# Patient Record
Sex: Male | Born: 1950 | Race: White | Hispanic: No | Marital: Married
Health system: Midwestern US, Academic
[De-identification: ages and names within clinical notes are randomized; demographics above are authoritative.]

---

## 2017-10-14 ENCOUNTER — Ambulatory Visit: Admit: 2017-10-14 | Discharge: 2017-10-14 | Payer: BC Managed Care – PPO

## 2017-10-14 ENCOUNTER — Encounter: Admit: 2017-10-14 | Discharge: 2017-10-14 | Payer: BC Managed Care – PPO

## 2017-10-14 DIAGNOSIS — H04123 Dry eye syndrome of bilateral lacrimal glands: ICD-10-CM

## 2017-10-14 DIAGNOSIS — Z961 Presence of intraocular lens: ICD-10-CM

## 2017-10-14 DIAGNOSIS — H43813 Vitreous degeneration, bilateral: Principal | ICD-10-CM

## 2017-10-14 NOTE — Assessment & Plan Note
PT does not appear overly dry, but has ongoing change in refraction ever since cataract surgery OS    Pt said he had to return 4 times before CEIOL to get measurements of his eyes that agreed OS    Use ATs QID and see if topo and refraction stabilizes.

## 2017-10-14 NOTE — Progress Notes
Assessment and Plan:    Problem   Posterior Vitreous Detachment of Both Eyes   Dry Eye Syndrome of Both Eyes   Pseudophakia, Both Eyes    Dr. Caron Presume:  OS 03/10/17 - Toric for distance - had to get measurements x4 pre-op - has changed glasses RX 2x since surgery - was told lens was migrating and he needed LRIs  OD: 03/2017 - monofocal lens with LRI for distance         Posterior vitreous detachment of both eyes  Reassured pt. Long-standing. OCT mac WNL OU    Dry eye syndrome of both eyes  PT does not appear overly dry, but has ongoing change in refraction ever since cataract surgery OS    Pt said he had to return 4 times before CEIOL to get measurements of his eyes that agreed OS    Use ATs QID and see if topo and refraction stabilizes.        Pseudophakia, both eyes  Pt unhappy with vision after CEIOL. Make sure RX is stabilized before giving new rx.    Also try treating DES to see if this helps     Also discussed possibility of refractive surgery    Get old records to determine where IOL was placed originally    Next Visit: 6-8 weeks        MRx x   ECC    Gallelei x   Atlas x   OCT (M)(N)    Orbscan    LG x   TF BUT    Schirmer    IOP x   Pach    Dilate    BAM    B-Scan    HVF    TOSM x   Gonio      Madelaine Etienne, MD  [facility]             HPI:  Patient presents with:  Eye Problem: Pt here for blurry vision in both eyes near and far. He had cataract surgery earlier this year by Dr. Caron Presume. 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 He has floaters since the cataract surgery that don't go away.   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     OS (toric lens) was 03/10/17 and OD (with LRIs) was two weeks after that.     Both eyes were supposed to be corrected for distance per pt.  Vision was great first 3 weeks OS, but then it changed and vision became much more distorted.  Pt had toric lens OS.  Pt was told his IOL had migrated.  He was given new glasses. It changed again another month after that.  OD also was clear initially, and then an oil film developed.    Pt was offered LRI OS as well.      Pt notices floaters OU    Exam:  Base Eye Exam     Visual Acuity (Snellen - Linear)       Right Left    Dist cc 20/30 -1 20/40 -1    Dist ph cc  20/20 -2    Correction:  Glasses          Tonometry (Tonopen, 10:32 AM)       Right Left    Pressure 13 11  Pupils       Dark Shape React APD    Right 5 Round Brisk None    Left 5 Round Brisk None          Visual Fields (Counting fingers)       Left Right     Full Full          Extraocular Movement       Right Left     Full, Ortho Full, Ortho          Neuro/Psych     Oriented x3:  Yes    Mood/Affect:  Normal          Dilation     Both eyes:  1.0% Tropicamide, 2.5% Phenylephrine @ 10:32 AM            Slit Lamp and Fundus Exam     Slit Lamp Exam       Right Left    Lids/Lashes Normal Normal    Conjunctiva/Sclera White and quiet White and quiet    Cornea Clear Clear    Anterior Chamber Deep and quiet Deep and quiet    Iris Flat Flat    Lens Posterior chamber intraocular lens, tr pco nasally Toric IOL at about 150; clear capsule    Vitreous Posterior vitreous detachment Posterior vitreous detachment          Fundus Exam       Right Left    Disc Sharp, healthy rim Sharp, healthy rim    C/D Ratio 0.2 0.2    Macula Flat Flat    Vessels Normal caliber and number Normal caliber and number    Periphery Attached, no breaks or tears Attached, no breaks or tears            Refraction     Wearing Rx       Sphere Add    Right [place] +2.75    Left -1.25 +2.75          Manifest Refraction       Sphere Cylinder Axis Dist VA Add [place]    Right -0.25 +0.50 059 20/20 +2.50 20/20    Left -1.75 +1.00 041 20/25 +2.50 20/20

## 2017-10-14 NOTE — Assessment & Plan Note
Pt unhappy with vision after CEIOL. Make sure RX is stabilized before giving new rx.    Also try treating DES to see if this helps     Also discussed possibility of refractive surgery    Get old records to determine where IOL was placed originally

## 2017-10-14 NOTE — Assessment & Plan Note
Reassured pt. Long-standing. OCT mac WNL OU

## 2017-10-23 ENCOUNTER — Encounter: Admit: 2017-10-23 | Discharge: 2017-10-23 | Payer: BC Managed Care – PPO

## 2017-11-26 ENCOUNTER — Ambulatory Visit: Admit: 2017-11-26 | Discharge: 2017-11-27 | Payer: BC Managed Care – PPO

## 2017-11-26 ENCOUNTER — Encounter: Admit: 2017-11-26 | Discharge: 2017-11-26 | Payer: BC Managed Care – PPO

## 2017-11-26 DIAGNOSIS — H04123 Dry eye syndrome of bilateral lacrimal glands: Principal | ICD-10-CM

## 2017-11-26 NOTE — Progress Notes
Assessment and Plan:    Problem   Dry Eye Syndrome of Both Eyes    0.7 plugs OU 11/26/17         Dry eye syndrome of both eyes  Plugs placed OU today. Mild LG stain and normal Tosm, but irregular astigmatism could be consistent with dry eye    Need to keep repeating refraction and topo until stable - once stable consider glasses RX vs. PRK with Dr. Darcus Austin    Pt had SA6AT3 toric lens placed at 140 - does not appear to have rotated - astigmatism appears over-corrected at this time, but lens choice was appropriate given measurements used at time of surgery. Pt still has mild edema at incision site - as this resolves, this may account for the overcorrection.  Pt also had to repeat measure 4-6 times pre-op in order to get consistent measurements OS    Next Visit: 4 weeks  MD to see if pt is ortho next visit      MRx x   ECC    Gallelei x   Atlas x   OCT (M)(N)    Orbscan    LG    TF BUT    Schirmer    IOP x   Pach    Dilate    BAM    B-Scan    HVF    TOSM    Gonio      Madelaine Etienne, MD  [facility]             HPI:  Patient presents with:  Eye Problem: 2 mo FUV for PVD OU, DES and Pseudophakia OU w/ possibility of refractive surgery. No va changes. No pain or discomfort. No changes in floaters and no flashes of lights.   Medications Only: AT PRN OU.     Pt is using tears once or twice daily.    Exam:  Base Eye Exam     Visual Acuity (Snellen - Linear)       Right Left    Dist cc 20/40 20/40    Correction:  Glasses          Tonometry (I-care , 2:05 PM)       Right Left    Pressure 10 10          Pupils       Dark React APD    Right 6 + 0    Left 5 + 0          Neuro/Psych     Oriented x3:  Yes    Mood/Affect:  Normal            Slit Lamp and Fundus Exam     Slit Lamp Exam       Right Left    Lids/Lashes Normal Normal    Conjunctiva/Sclera 1+ LG nasal 1+ LG nasal    Cornea Clear Clear    Anterior Chamber Deep and quiet Deep and quiet    Iris Flat Flat Lens Posterior chamber intraocular lens, tr pco nasally Toric IOL at about 150; clear capsule    Vitreous Posterior vitreous detachment Posterior vitreous detachment            Refraction     Wearing Rx       Sphere Add    Right [place] +2.75    Left -1.25 +2.75          Manifest Refraction       Sphere Cylinder Axis Dist  VA    Right -0.25 +0.75 053 20/20    Left -1.50 +1.00 017 20/20                TEAR OSMOLARITY       Interpretation / Result:         Fluctuation in vision  x Redness    Contact Lens discomfort  Burning   x Light Sensitivity x Itching    Watery eyes  Feeling of sand or grit in eye   x Tired eyes             Tear Osmo        OD 287       OS 293              CORNEAL TOPOGRAPHY       Spherical OD; irregular astigmatism OS - still has swelling at site of incision           CLOSURE LACRIMAL PUNC BY PLUG, OU-BOTH EYES       Interpretation / Result:  0.7 placed LL OU without complication

## 2017-11-26 NOTE — Assessment & Plan Note
Plugs placed OU today. Mild LG stain and normal Tosm, but irregular astigmatism could be consistent with dry eye    Need to keep repeating refraction and topo until stable - once stable consider glasses RX vs. PRK with Dr. Darcus AustinGoins    Pt had SA6AT3 toric lens placed at 140 - does not appear to have rotated - astigmatism appears over-corrected at this time, but lens choice was appropriate given measurements used at time of surgery. Pt still has mild edema at incision site - as this resolves, this may account for the overcorrection.  Pt also had to repeat measure 4-6 times pre-op in order to get consistent measurements OS

## 2017-12-24 ENCOUNTER — Ambulatory Visit: Admit: 2017-12-24 | Discharge: 2017-12-25 | Payer: BC Managed Care – PPO

## 2017-12-24 DIAGNOSIS — H04123 Dry eye syndrome of bilateral lacrimal glands: Principal | ICD-10-CM

## 2017-12-24 MED ORDER — CYCLOSPORINE 0.05 % OP DPET
1 [drp] | Freq: Two times a day (BID) | OPHTHALMIC | 3 refills | 90.00000 days | Status: AC
Start: 2017-12-24 — End: 2018-03-25

## 2018-01-06 ENCOUNTER — Encounter: Admit: 2018-01-06 | Discharge: 2018-01-06 | Payer: BC Managed Care – PPO

## 2018-01-12 ENCOUNTER — Encounter: Admit: 2018-01-12 | Discharge: 2018-01-12 | Payer: BC Managed Care – PPO

## 2018-02-08 ENCOUNTER — Encounter: Admit: 2018-02-08 | Discharge: 2018-02-08 | Payer: BC Managed Care – PPO

## 2018-03-25 ENCOUNTER — Encounter: Admit: 2018-03-25 | Discharge: 2018-03-25 | Payer: BC Managed Care – PPO

## 2018-03-25 ENCOUNTER — Ambulatory Visit: Admit: 2018-03-25 | Discharge: 2018-03-25 | Payer: BC Managed Care – PPO

## 2018-03-25 DIAGNOSIS — H04123 Dry eye syndrome of bilateral lacrimal glands: Principal | ICD-10-CM

## 2018-03-25 DIAGNOSIS — H43813 Vitreous degeneration, bilateral: ICD-10-CM

## 2018-07-09 ENCOUNTER — Ambulatory Visit: Admit: 2018-07-09 | Discharge: 2018-07-10 | Payer: BC Managed Care – PPO

## 2018-07-09 DIAGNOSIS — H43813 Vitreous degeneration, bilateral: Principal | ICD-10-CM

## 2018-07-12 ENCOUNTER — Encounter: Admit: 2018-07-12 | Discharge: 2018-07-12 | Payer: BC Managed Care – PPO

## 2018-07-14 ENCOUNTER — Encounter: Admit: 2018-07-14 | Discharge: 2018-07-14 | Payer: BC Managed Care – PPO

## 2018-07-16 ENCOUNTER — Encounter: Admit: 2018-07-16 | Discharge: 2018-07-16 | Payer: BC Managed Care – PPO

## 2018-08-10 ENCOUNTER — Encounter: Admit: 2018-08-10 | Discharge: 2018-08-10 | Payer: BC Managed Care – PPO

## 2018-08-10 DIAGNOSIS — H43399 Other vitreous opacities, unspecified eye: Principal | ICD-10-CM

## 2018-08-26 ENCOUNTER — Encounter: Admit: 2018-08-26 | Discharge: 2018-08-26 | Payer: BC Managed Care – PPO

## 2018-08-26 DIAGNOSIS — M2560 Stiffness of unspecified joint, not elsewhere classified: ICD-10-CM

## 2018-08-26 DIAGNOSIS — E785 Hyperlipidemia, unspecified: Principal | ICD-10-CM

## 2018-09-01 ENCOUNTER — Encounter: Admit: 2018-09-01 | Discharge: 2018-09-01 | Payer: BC Managed Care – PPO

## 2018-09-02 ENCOUNTER — Encounter: Admit: 2018-09-02 | Discharge: 2018-09-02 | Payer: BC Managed Care – PPO

## 2018-09-02 ENCOUNTER — Ambulatory Visit: Admit: 2018-09-02 | Discharge: 2018-09-02 | Payer: BC Managed Care – PPO

## 2018-09-02 DIAGNOSIS — H43391 Other vitreous opacities, right eye: Principal | ICD-10-CM

## 2018-09-02 DIAGNOSIS — H43813 Vitreous degeneration, bilateral: ICD-10-CM

## 2018-09-02 DIAGNOSIS — Z87891 Personal history of nicotine dependence: ICD-10-CM

## 2018-09-02 DIAGNOSIS — E785 Hyperlipidemia, unspecified: ICD-10-CM

## 2018-09-02 DIAGNOSIS — M2560 Stiffness of unspecified joint, not elsewhere classified: ICD-10-CM

## 2018-09-02 MED ORDER — TRIAMCINOLONE ACETONIDE 40 MG/ML IJ SUSP
0 refills | Status: DC
Start: 2018-09-02 — End: 2018-09-02
  Administered 2018-09-02: 18:00:00 40 mg via INTRAMUSCULAR

## 2018-09-02 MED ORDER — DIFLUPREDNATE 0.05 % OP DROP
1 [drp] | Freq: Four times a day (QID) | OPHTHALMIC | 1 refills | 19.00000 days | Status: AC
Start: 2018-09-02 — End: 2018-10-11

## 2018-09-02 MED ORDER — EPINEPHRINE 0.3 MG IN ISOTONIC OPHTH IRR(BSS+)
0 refills | Status: DC
Start: 2018-09-02 — End: 2018-09-02
  Administered 2018-09-02 (×2): 500 mL via INTRAOCULAR

## 2018-09-02 MED ORDER — PROPOFOL INJ 10 MG/ML IV VIAL
0 refills | Status: DC
Start: 2018-09-02 — End: 2018-09-02
  Administered 2018-09-02: 18:00:00 50 mg via INTRAVENOUS

## 2018-09-02 MED ORDER — DEXAMETHASONE SODIUM PHOSPHATE 4 MG/ML IJ SOLN
0 refills | Status: DC
Start: 2018-09-02 — End: 2018-09-02
  Administered 2018-09-02: 19:00:00 2 mg via SUBCONJUNCTIVAL

## 2018-09-02 MED ORDER — ATROPINE 1 % OP DROP
0 refills | Status: DC
Start: 2018-09-02 — End: 2018-09-02
  Administered 2018-09-02: 19:00:00 2 [drp] via OPHTHALMIC

## 2018-09-02 MED ORDER — BALANCED SALT SOLN NO.2 IRRIG. IO SOLN
0 refills | Status: DC
Start: 2018-09-02 — End: 2018-09-02
  Administered 2018-09-02: 18:00:00 15 mL via OPHTHALMIC

## 2018-09-02 MED ORDER — CYCLOPENTOLATE 1 % OP DROP
1 [drp] | OPHTHALMIC | 0 refills | Status: CP
Start: 2018-09-02 — End: ?
  Administered 2018-09-02: 17:00:00 1 [drp] via OPHTHALMIC

## 2018-09-02 MED ORDER — CEFAZOLIN INJ 1GM IVP
0 refills | Status: DC
Start: 2018-09-02 — End: 2018-09-02
  Administered 2018-09-02: 19:00:00 50 mg via SUBCONJUNCTIVAL

## 2018-09-02 MED ORDER — BUPIVACAINE 0.75%-LIDOCAINE 2%-HYALURONIDASE 150 UNITS SYR
0 refills | Status: DC
Start: 2018-09-02 — End: 2018-09-02
  Administered 2018-09-02 (×3): 7 mL via RETROBULBAR

## 2018-09-02 MED ORDER — TOBRAMYCIN-DEXAMETHASONE 0.3-0.1 % OP OINT
0 refills | Status: DC
Start: 2018-09-02 — End: 2018-09-02
  Administered 2018-09-02: 19:00:00 0.5 [in_us] via OPHTHALMIC

## 2018-09-02 MED ORDER — PHENYLEPHRINE HCL 2.5 % OP DROP
1 [drp] | OPHTHALMIC | 0 refills | Status: CP
Start: 2018-09-02 — End: ?
  Administered 2018-09-02: 17:00:00 1 [drp] via OPHTHALMIC

## 2018-09-02 MED ORDER — OFLOXACIN 0.3 % OP DROP
1 [drp] | Freq: Four times a day (QID) | OPHTHALMIC | 0 refills | 32.50000 days | Status: AC
Start: 2018-09-02 — End: 2018-10-11

## 2018-09-03 ENCOUNTER — Ambulatory Visit: Admit: 2018-09-03 | Discharge: 2018-09-04 | Payer: BC Managed Care – PPO

## 2018-09-03 DIAGNOSIS — H43813 Vitreous degeneration, bilateral: Principal | ICD-10-CM

## 2018-09-04 ENCOUNTER — Encounter: Admit: 2018-09-04 | Discharge: 2018-09-04 | Payer: BC Managed Care – PPO

## 2018-09-04 DIAGNOSIS — E785 Hyperlipidemia, unspecified: Principal | ICD-10-CM

## 2018-09-04 DIAGNOSIS — M2560 Stiffness of unspecified joint, not elsewhere classified: ICD-10-CM

## 2018-09-10 ENCOUNTER — Ambulatory Visit: Admit: 2018-09-10 | Discharge: 2018-09-11 | Payer: BC Managed Care – PPO

## 2018-09-10 DIAGNOSIS — H43813 Vitreous degeneration, bilateral: Principal | ICD-10-CM

## 2018-10-11 ENCOUNTER — Ambulatory Visit: Admit: 2018-10-11 | Discharge: 2018-10-12 | Payer: BC Managed Care – PPO

## 2018-10-11 ENCOUNTER — Encounter: Admit: 2018-10-11 | Discharge: 2018-10-11 | Payer: BC Managed Care – PPO

## 2018-10-11 DIAGNOSIS — H43812 Vitreous degeneration, left eye: Principal | ICD-10-CM

## 2018-10-11 DIAGNOSIS — H43813 Vitreous degeneration, bilateral: Principal | ICD-10-CM

## 2018-10-11 MED ORDER — OFLOXACIN 0.3 % OP DROP
1 [drp] | Freq: Four times a day (QID) | OPHTHALMIC | 0 refills | 32.50000 days | Status: AC
Start: 2018-10-11 — End: 2019-07-19

## 2018-10-11 MED ORDER — DIFLUPREDNATE 0.05 % OP DROP
1 [drp] | Freq: Four times a day (QID) | OPHTHALMIC | 1 refills | 19.00000 days | Status: AC
Start: 2018-10-11 — End: 2019-07-19

## 2018-10-13 ENCOUNTER — Encounter: Admit: 2018-10-13 | Discharge: 2018-10-13 | Payer: BC Managed Care – PPO

## 2018-10-13 DIAGNOSIS — H43812 Vitreous degeneration, left eye: ICD-10-CM

## 2018-10-13 DIAGNOSIS — H43813 Vitreous degeneration, bilateral: ICD-10-CM

## 2018-10-26 ENCOUNTER — Encounter: Admit: 2018-10-26 | Discharge: 2018-10-26 | Payer: BC Managed Care – PPO

## 2018-10-26 DIAGNOSIS — E785 Hyperlipidemia, unspecified: Principal | ICD-10-CM

## 2018-10-26 DIAGNOSIS — M2560 Stiffness of unspecified joint, not elsewhere classified: ICD-10-CM

## 2018-10-26 DIAGNOSIS — I10 Essential (primary) hypertension: ICD-10-CM

## 2018-11-03 ENCOUNTER — Encounter: Admit: 2018-11-03 | Discharge: 2018-11-03 | Payer: BC Managed Care – PPO

## 2018-11-03 ENCOUNTER — Ambulatory Visit: Admit: 2018-11-03 | Discharge: 2018-11-03 | Payer: BC Managed Care – PPO

## 2018-11-04 ENCOUNTER — Encounter: Admit: 2018-11-04 | Discharge: 2018-11-04 | Payer: BC Managed Care – PPO

## 2018-11-04 ENCOUNTER — Ambulatory Visit: Admit: 2018-11-04 | Discharge: 2018-11-04 | Payer: BC Managed Care – PPO

## 2018-11-04 DIAGNOSIS — Z9841 Cataract extraction status, right eye: ICD-10-CM

## 2018-11-04 DIAGNOSIS — M2560 Stiffness of unspecified joint, not elsewhere classified: ICD-10-CM

## 2018-11-04 DIAGNOSIS — H43392 Other vitreous opacities, left eye: Principal | ICD-10-CM

## 2018-11-04 DIAGNOSIS — I10 Essential (primary) hypertension: ICD-10-CM

## 2018-11-04 DIAGNOSIS — Z87891 Personal history of nicotine dependence: ICD-10-CM

## 2018-11-04 DIAGNOSIS — H35412 Lattice degeneration of retina, left eye: ICD-10-CM

## 2018-11-04 DIAGNOSIS — Z9842 Cataract extraction status, left eye: ICD-10-CM

## 2018-11-04 DIAGNOSIS — E785 Hyperlipidemia, unspecified: Principal | ICD-10-CM

## 2018-11-04 DIAGNOSIS — G4730 Sleep apnea, unspecified: ICD-10-CM

## 2018-11-04 MED ORDER — CEFAZOLIN INJ 1GM IVP
0 refills | Status: DC
Start: 2018-11-04 — End: 2018-11-04
  Administered 2018-11-04: 18:00:00 50 mg via SUBCONJUNCTIVAL

## 2018-11-04 MED ORDER — PROPOFOL INJ 10 MG/ML IV VIAL
0 refills | Status: DC
Start: 2018-11-04 — End: 2018-11-04
  Administered 2018-11-04: 18:00:00 70 mg via INTRAVENOUS

## 2018-11-04 MED ORDER — TOBRAMYCIN-DEXAMETHASONE 0.3-0.1 % OP OINT
0 refills | Status: DC
Start: 2018-11-04 — End: 2018-11-04
  Administered 2018-11-04: 18:00:00 0.5 [in_us] via OPHTHALMIC

## 2018-11-04 MED ORDER — BUPIVACAINE 0.75%-LIDOCAINE 2%-HYALURONIDASE 150 UNITS SYR
0 refills | Status: DC
Start: 2018-11-04 — End: 2018-11-04
  Administered 2018-11-04 (×3): 7 mL via RETROBULBAR

## 2018-11-04 MED ORDER — BALANCED SALT SOLN NO.2 IRRIG. IO SOLN
0 refills | Status: DC
Start: 2018-11-04 — End: 2018-11-04
  Administered 2018-11-04: 18:00:00 15 mL via OPHTHALMIC

## 2018-11-04 MED ORDER — PROPARACAINE 0.5 % OP DROP
1 [drp] | OPHTHALMIC | 0 refills | Status: DC
Start: 2018-11-04 — End: 2018-11-04
  Administered 2018-11-04: 17:00:00 1 [drp] via OPHTHALMIC

## 2018-11-04 MED ORDER — ATROPINE 1 % OP DROP
0 refills | Status: DC
Start: 2018-11-04 — End: 2018-11-04
  Administered 2018-11-04: 18:00:00 1 [drp] via OPHTHALMIC

## 2018-11-04 MED ORDER — CYCLOPENTOLATE 1 % OP DROP
1 [drp] | OPHTHALMIC | 0 refills | Status: CP
Start: 2018-11-04 — End: ?
  Administered 2018-11-04: 17:00:00 1 [drp] via OPHTHALMIC

## 2018-11-04 MED ORDER — DEXAMETHASONE SODIUM PHOSPHATE 4 MG/ML IJ SOLN
0 refills | Status: DC
Start: 2018-11-04 — End: 2018-11-04
  Administered 2018-11-04: 18:00:00 2 mg via OPHTHALMIC

## 2018-11-04 MED ORDER — EPINEPHRINE 0.3 MG IN ISOTONIC OPHTH IRR(BSS+)
0 refills | Status: DC
Start: 2018-11-04 — End: 2018-11-04
  Administered 2018-11-04 (×2): 500 mL via INTRAOCULAR

## 2018-11-04 MED ORDER — PHENYLEPHRINE HCL 2.5 % OP DROP
1 [drp] | OPHTHALMIC | 0 refills | Status: CP
Start: 2018-11-04 — End: ?
  Administered 2018-11-04: 17:00:00 1 [drp] via OPHTHALMIC

## 2018-11-05 ENCOUNTER — Ambulatory Visit: Admit: 2018-11-05 | Discharge: 2018-11-06 | Payer: BC Managed Care – PPO

## 2018-11-05 DIAGNOSIS — H43813 Vitreous degeneration, bilateral: Principal | ICD-10-CM

## 2018-11-08 ENCOUNTER — Encounter: Admit: 2018-11-08 | Discharge: 2018-11-08 | Payer: BC Managed Care – PPO

## 2018-11-08 DIAGNOSIS — M2560 Stiffness of unspecified joint, not elsewhere classified: ICD-10-CM

## 2018-11-08 DIAGNOSIS — G4730 Sleep apnea, unspecified: ICD-10-CM

## 2018-11-08 DIAGNOSIS — E785 Hyperlipidemia, unspecified: Principal | ICD-10-CM

## 2018-11-08 DIAGNOSIS — I10 Essential (primary) hypertension: ICD-10-CM

## 2018-11-12 ENCOUNTER — Encounter: Admit: 2018-11-12 | Discharge: 2018-11-12 | Payer: BC Managed Care – PPO

## 2018-11-12 ENCOUNTER — Ambulatory Visit: Admit: 2018-11-12 | Discharge: 2018-11-13 | Payer: BC Managed Care – PPO

## 2018-11-12 DIAGNOSIS — H43813 Vitreous degeneration, bilateral: Principal | ICD-10-CM

## 2018-11-12 DIAGNOSIS — G4730 Sleep apnea, unspecified: ICD-10-CM

## 2018-11-12 DIAGNOSIS — M2560 Stiffness of unspecified joint, not elsewhere classified: ICD-10-CM

## 2018-11-12 DIAGNOSIS — E785 Hyperlipidemia, unspecified: Principal | ICD-10-CM

## 2018-11-12 DIAGNOSIS — I10 Essential (primary) hypertension: ICD-10-CM

## 2018-12-10 ENCOUNTER — Ambulatory Visit: Admit: 2018-12-10 | Discharge: 2018-12-11 | Payer: BC Managed Care – PPO

## 2018-12-10 DIAGNOSIS — H43813 Vitreous degeneration, bilateral: Principal | ICD-10-CM

## 2019-05-26 ENCOUNTER — Encounter: Admit: 2019-05-26 | Discharge: 2019-05-26

## 2019-05-26 DIAGNOSIS — Z0000 Encounter for general adult medical examination without abnormal findings: Secondary | ICD-10-CM

## 2019-05-26 NOTE — Telephone Encounter
Spoke to pt to introduce myself and offer to schedule establish care appt.  Offered pt 07/19/2019 at 9am.  Pt agreed.  Pt requested labs that he might have to have drawn.  I informed him that I would place the orders for the labs & we identified lab at [facility] location to have them done.    Scheduled pt & ordered labs.

## 2019-07-14 ENCOUNTER — Encounter: Admit: 2019-07-14 | Discharge: 2019-07-14

## 2019-07-14 NOTE — Progress Notes
Pt hospitalized since last office visit: Not applicable    Health Maintenance Due   Topic Date Due    DTAP/TDAP VACCINES (1 - Tdap) 04/26/1971    PHYSICAL (COMPREHENSIVE) EXAM  04/26/1971    HEPATITIS C SCREENING  04/26/1971    SHINGLES RECOMBINANT VACCINE (1 of 2) 04/26/2003    COLORECTAL CANCER SCREENING  04/26/2003    ABDOMINAL AORTIC ANEURYSM SCREENING  04/25/2018    PNEUMONIA (PPSV23) VACCINE (1 of 1 - PPSV23) 04/25/2018       There are no diagnoses linked to this encounter.    Labs not done:      Lab Frequency Next Occurrence   CBC AND DIFF Once 05/26/2019   LIPID PROFILE Once 05/26/2019   LIVER FUNCTION PANEL Once 05/26/2019   HEMOGLOBIN A1C Once 77/93/9030   BASIC METABOLIC PANEL Once 09/13/3006   TSH WITH FREE T4 REFLEX Once 05/26/2019       MyChart message sent 07/14/2019.     Notes to provider:

## 2019-07-18 ENCOUNTER — Ambulatory Visit: Admit: 2019-07-18 | Discharge: 2019-07-18

## 2019-07-18 ENCOUNTER — Encounter: Admit: 2019-07-18 | Discharge: 2019-07-18

## 2019-07-18 DIAGNOSIS — Z0000 Encounter for general adult medical examination without abnormal findings: Principal | ICD-10-CM

## 2019-07-18 LAB — CBC AND DIFF
Lab: 0 10*3/uL (ref 0–0.20)
Lab: 4.4 M/UL (ref 4.4–5.5)
Lab: 5.4 10*3/uL (ref 4.5–11.0)

## 2019-07-18 LAB — BASIC METABOLIC PANEL
Lab: 0.8 mg/dL (ref 0.4–1.24)
Lab: 11 % (ref 3–12)
Lab: 141 MMOL/L (ref 137–147)
Lab: 25 MMOL/L (ref 21–30)
Lab: 4.1 MMOL/L — ABNORMAL LOW (ref 3.5–5.1)
Lab: 95 mg/dL — ABNORMAL HIGH (ref 70–100)
Lab: >60 mL/min (ref >60)
Lab: >60 mL/min (ref >60)

## 2019-07-18 LAB — TSH WITH FREE T4 REFLEX: Lab: 3.4 uU/mL (ref 0.35–5.00)

## 2019-07-18 LAB — LIPID PROFILE
Lab: 157 mg/dL — ABNORMAL HIGH (ref <150)
Lab: 244 mg/dL — ABNORMAL HIGH (ref <200)

## 2019-07-18 LAB — LIVER FUNCTION PANEL
Lab: 0.1 mg/dL — ABNORMAL HIGH (ref <0.4)
Lab: 0.4 mg/dL (ref >40)
Lab: 69 U/L (ref 25–110)

## 2019-07-18 LAB — HEMOGLOBIN A1C: Lab: 5.2 % (ref 4.0–6.0)

## 2019-07-19 ENCOUNTER — Encounter: Admit: 2019-07-19 | Discharge: 2019-07-19

## 2019-07-19 ENCOUNTER — Ambulatory Visit: Admit: 2019-07-19 | Discharge: 2019-07-19

## 2019-07-19 DIAGNOSIS — Z0000 Encounter for general adult medical examination without abnormal findings: Secondary | ICD-10-CM

## 2019-07-19 DIAGNOSIS — R531 Weakness: Secondary | ICD-10-CM

## 2019-07-19 DIAGNOSIS — F419 Anxiety disorder, unspecified: Secondary | ICD-10-CM

## 2019-07-19 DIAGNOSIS — Z125 Encounter for screening for malignant neoplasm of prostate: Secondary | ICD-10-CM

## 2019-07-19 DIAGNOSIS — I10 Essential (primary) hypertension: Secondary | ICD-10-CM

## 2019-07-19 DIAGNOSIS — M2560 Stiffness of unspecified joint, not elsewhere classified: Secondary | ICD-10-CM

## 2019-07-19 DIAGNOSIS — Z136 Encounter for screening for cardiovascular disorders: Secondary | ICD-10-CM

## 2019-07-19 DIAGNOSIS — E785 Hyperlipidemia, unspecified: Secondary | ICD-10-CM

## 2019-07-19 DIAGNOSIS — F5101 Primary insomnia: Secondary | ICD-10-CM

## 2019-07-19 DIAGNOSIS — R7301 Impaired fasting glucose: Secondary | ICD-10-CM

## 2019-07-19 DIAGNOSIS — E782 Mixed hyperlipidemia: Secondary | ICD-10-CM

## 2019-07-19 DIAGNOSIS — F329 Major depressive disorder, single episode, unspecified: Secondary | ICD-10-CM

## 2019-07-19 DIAGNOSIS — G4730 Sleep apnea, unspecified: Secondary | ICD-10-CM

## 2019-07-19 LAB — VITAMIN B12: Lab: 430 pg/mL (ref 180–914)

## 2019-07-19 LAB — PSA SCREEN: Lab: 0.2 ng/mL (ref <4.01)

## 2019-07-19 MED ORDER — SIMVASTATIN 40 MG PO TAB
40 mg | ORAL_TABLET | Freq: Every evening | ORAL | 3 refills | Status: DC
Start: 2019-07-19 — End: 2019-10-19

## 2019-07-19 MED ORDER — MELOXICAM 15 MG PO TAB
15 mg | ORAL_TABLET | Freq: Every day | ORAL | 3 refills | 30.00000 days | Status: DC
Start: 2019-07-19 — End: 2020-02-14

## 2019-07-19 MED ORDER — HYDROCHLOROTHIAZIDE 12.5 MG PO CAP
12.5 mg | ORAL_CAPSULE | Freq: Every day | ORAL | 3 refills | 30.00000 days | Status: DC
Start: 2019-07-19 — End: 2019-10-20

## 2019-07-19 MED ORDER — CITALOPRAM 20 MG PO TAB
20 mg | ORAL_TABLET | Freq: Every day | ORAL | 3 refills | Status: DC
Start: 2019-07-19 — End: 2019-09-05

## 2019-07-19 MED ORDER — LOSARTAN 25 MG PO TAB
25 mg | ORAL_TABLET | Freq: Every day | ORAL | 3 refills | 30.00000 days | Status: AC
Start: 2019-07-19 — End: ?

## 2019-07-19 NOTE — Progress Notes
Date of Service: 07/19/2019    Jared Hebert is a 68 y.o. male. DOB: 12-01-1951   MRN#: [id number]    Subjective: establish care       History of Present Illness  Jared Hebert is 68 y.o. patient who presents to clinic to establish care. He is a Engineer, civil (consulting) in [place]. No smoking, no alcohol, no street drugs. No regular exercise. He has 4 children.     He managed to lose intentionally 55 Lb over last 1 year by having ketogenic diet. He takes multivitamin daily.    He feels tired.    He also struggles with depression but no intent to harm himself or others. His brother had depression and committed suicide in 2003.       Medical History:   Diagnosis Date   ??? Hyperlipidemia    ??? Hypertension    ??? Joint stiffness 2009   ??? Sleep apnea        Surgical History:   Procedure Laterality Date   ??? HX CATARACT REMOVAL Bilateral 02/2017    OS Toric   ??? PARS PLANA MECHANICAL VITRECTOMY WITH  Kenolog Right 09/02/2018    Performed by Blair Hailey, MD at [facility] OR   ??? PARS PLANA MECHANICAL VITRECTOMY Left 11/04/2018    Performed by Blair Hailey, MD at [facility] OR   ??? HX ADENOIDECTOMY     ??? HX TONSILLECTOMY     ??? NASAL SURGERY      TWICE       family history includes Cataract in his father and mother.    Social History     Socioeconomic History   ??? Marital status: Married     Spouse name: Not on file   ??? Number of children: Not on file   ??? Years of education: Not on file   ??? Highest education level: Not on file   Occupational History   ??? Not on file   Tobacco Use   ??? Smoking status: Former Smoker     Last attempt to quit: 2008     Years since quitting: 12.5   ??? Smokeless tobacco: Former Estate agent and Sexual Activity   ??? Alcohol use: Yes     Frequency: Never     Comment: seldom    ??? Drug use: No   ??? Sexual activity: Not on file   Other Topics Concern   ??? Not on file   Social History Narrative   ??? Not on file       Social History     Tobacco Use   ??? Smoking status: Former Smoker     Last attempt to quit: 2008 Years since quitting: 12.5   ??? Smokeless tobacco: Former Neurosurgeon   Substance Use Topics   ??? Alcohol use: Yes     Frequency: Never     Comment: seldom         Review of Systems   Constitution: Positive for malaise/fatigue. Negative for weight gain.   HENT: Negative for sore throat and stridor.    Cardiovascular: Negative for chest pain, orthopnea, palpitations and syncope.   Respiratory: Negative for cough, shortness of breath, snoring and wheezing.    Skin: Negative for rash.   Musculoskeletal: Negative for back pain and joint pain.   Gastrointestinal: Negative for abdominal pain, diarrhea, nausea and vomiting.   Genitourinary: Negative for dysuria, flank pain and hesitancy.   Neurological: Negative for disturbances in coordination, excessive daytime sleepiness, dizziness, focal  weakness, numbness, seizures and weakness.   Psychiatric/Behavioral: Positive for depression. Negative for hallucinations and memory loss. The patient is not nervous/anxious.    Allergic/Immunologic: Negative for hives.   All other systems reviewed and are negative.      Objective:     ??? ascorbic acid (VITAMIN C) 500 mg tablet Take 500 mg by mouth daily.   ??? aspirin EC 81 mg tablet Take 81 mg by mouth daily. Take with food.   ??? cyclobenzaprine HCl (FLEXERIL PO) Take 5 mg by mouth at bedtime as needed.   ??? famotidine (PEPCID) 20 mg tablet Take 20 mg by mouth twice daily.   ??? fluticasone propionate (FLONASE) 50 mcg/actuation nasal spray, suspension Apply 1 spray to each nostril as directed daily. Shake bottle gently before using.   ??? glucosamine(+) 500 mg tab Take 1,500 mg by mouth once.   ??? HYDROCHLOROTHIAZIDE PO Take 12.5 mg by mouth daily. Indications: pt unsure of dosage- daily   ??? lifitegrast 5 % dpet Place 1 drop into or around eye(s) twice daily.   ??? losartan (COZAAR) 25 mg tablet Take 25 mg by mouth daily. Indications: pt unsure of doseage   ??? meloxicam (MOBIC) 15 mg tablet Take 15 mg by mouth daily. ??? mirtazapine (REMERON) 45 mg tablet Take 45 mg by mouth at bedtime daily.   ??? simvastatin (ZOCOR) 20 mg tablet Take 20 mg by mouth at bedtime daily.   ??? venlafaxine XR (EFFEXOR XR) 150 mg capsule Take 150 mg by mouth daily. Take with food.   ??? zolpidem (AMBIEN) 5 mg tablet Take 5 mg by mouth at bedtime as needed for Sleep.     Vitals:    07/19/19 0904   BP: 135/88   BP Source: Arm, Right Upper   Patient Position: Sitting   Pulse: 75   Resp: 16   Temp: 36.7 ???C (98.1 ???F)   TempSrc: Oral   Weight: 78 kg (172 lb)   Height: 175.3 cm (69)   PainSc: Zero     Body mass index is 25.4 kg/m???.     Physical Exam  Vitals signs and nursing note reviewed.   Constitutional:       General: He is not in acute distress.     Appearance: He is well-developed. He is not diaphoretic.   HENT:      Head: Normocephalic and atraumatic.      Right Ear: External ear normal.      Left Ear: External ear normal.      Nose: Nose normal.   Eyes:      General: No scleral icterus.        Right eye: No discharge.         Left eye: No discharge.      Conjunctiva/sclera: Conjunctivae normal.      Pupils: Pupils are equal, round, and reactive to light.   Neck:      Musculoskeletal: Normal range of motion and neck supple.      Thyroid: No thyromegaly.      Vascular: No JVD.      Trachea: No tracheal deviation.   Cardiovascular:      Rate and Rhythm: Normal rate and regular rhythm.      Heart sounds: Normal heart sounds. No murmur. No friction rub.   Pulmonary:      Effort: Pulmonary effort is normal. No respiratory distress.      Breath sounds: Normal breath sounds. No wheezing or rales.   Chest:  Chest wall: No tenderness.   Abdominal:      General: There is no distension.      Palpations: Abdomen is soft. There is no mass.      Tenderness: There is no abdominal tenderness. There is no guarding or rebound.   Musculoskeletal: Normal range of motion.   Lymphadenopathy:      Cervical: No cervical adenopathy.   Skin:     General: Skin is warm and dry. Neurological:      Mental Status: He is alert and oriented to person, place, and time.      Cranial Nerves: No cranial nerve deficit.   Psychiatric:         Behavior: Behavior normal.          Assessment and Plan:      Problem   Anxiety and Depression   Mixed Hyperlipidemia   Hypertension, Essential   Primary Insomnia     The patient is in clinic to establish care:    Hypertension:  He is on HCTZ and Losarten  BP Readings from Last 3 Encounters:   07/19/19 135/88   11/04/18 120/78   09/02/18 130/65   continue  He had stress test and echo in 02/2016 that were reported as normal and I reviewed in care Every where  Will order CT calcium score    Hyperlipidemia:  He is on Zocor 20mg  daily but did not take for 30 days. He had cramps with Lipitor.  Will increase Zocor to 40mg  daily  Plan on repeat labs in 3 months  Lab Results   Component Value Date    CHOL 244 (H) 07/18/2019    TRIG 157 (H) 07/18/2019    HDL 47 07/18/2019    LDL 158 (H) 07/18/2019    VLDL 31 07/18/2019    NONHDLCHOL 197 07/18/2019      Anxiety and Depression:   He had for 20 years  He is on Effexor and Remeron  He is not feeling that this combination is helping  He is homicidal or suicidal but his brother committed suicide in 2013  We will get him to taper off his medications over next 2 weeks and we will start Celexa 20mg  daily after he tapers off  We will refer to psychologist  I am referring this patient to the behavior health consultant for the following conditions:  Health Risk Behaviors:  Stress management  Mental Health Conditions:  Depression and Anxiety    History of sleep apnea:  He is not snoring after he lost weight    Weakness:  Will check labs, TSH and B12 level     Hearing 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  Estimated counseling time 25 minutes.  Counseled patient regarding establishing care and weakness. We discussed safety during COVID19 pandemic.     Orders Placed This Encounter   ??? [place] AAA SCREENING   ??? CT CARDIAC CALCIUM SCORE WO CONT   ??? PNEUMOCOCCAL CONJ VACCINE 13-VAL   ??? B12 VITAMIN today   ??? PSA SCREEN today   ??? [facility]   ??? hydroCHLOROthiazide (HYDRODIURIL) 12.5 mg capsule   ??? meloxicam (MOBIC) 15 mg  tablet   ??? simvastatin (ZOCOR) 40 mg tablet   ??? losartan (COZAAR) 25 mg tablet   ??? citalopram (CELEXA) 20 mg tablet     Encounter Medications   Medications   ??? DISCONTD: cyclobenzaprine HCl (FLEXERIL PO)     Sig: Take 5 mg by mouth at bedtime as needed.   ??? ascorbic acid (VITAMIN C) 500 mg tablet     Sig: Take 500 mg by mouth daily.   ??? famotidine (PEPCID) 20 mg tablet     Sig: Take 20 mg by mouth twice daily.   ??? fluticasone propionate (FLONASE) 50 mcg/actuation nasal spray, suspension     Sig: Apply 1 spray to each nostril as directed daily. Shake bottle gently before using.   ??? hydroCHLOROthiazide (HYDRODIURIL) 12.5 mg capsule     Sig: Take one capsule by mouth daily. Indications: pt unsure of dosage- daily     Dispense:  90 capsule     Refill:  3   ??? meloxicam (MOBIC) 15 mg tablet     Sig: Take one tablet by mouth daily.     Dispense:  90 tablet     Refill:  3   ??? simvastatin (ZOCOR) 40 mg tablet     Sig: Take one tablet by mouth at bedtime daily.     Dispense:  90 tablet     Refill:  3   ??? losartan (COZAAR) 25 mg tablet     Sig: Take one tablet by mouth daily. Indications: pt unsure of doseage     Dispense:  90 tablet     Refill:  3   ??? citalopram (CELEXA) 20 mg tablet     Sig: Take one tablet by mouth daily.     Dispense:  90 tablet     Refill:  3     Please discontinue old scripts for Effexor and remeron     No future appointments.  There are no Patient Instructions on file for this visit.

## 2019-07-19 NOTE — Progress Notes
Administered 0.5mL of prevnar 13 into left deltoid.  Pt tolerated injection well. Pt signed consent and was given VIS sheet, Pt had no other questions or concerns at this time.

## 2019-07-26 ENCOUNTER — Encounter: Admit: 2019-07-26 | Discharge: 2019-07-26

## 2019-08-04 ENCOUNTER — Encounter: Admit: 2019-08-04 | Discharge: 2019-08-04

## 2019-08-04 DIAGNOSIS — I10 Essential (primary) hypertension: Secondary | ICD-10-CM

## 2019-08-04 DIAGNOSIS — M1990 Unspecified osteoarthritis, unspecified site: Secondary | ICD-10-CM

## 2019-08-04 DIAGNOSIS — E785 Hyperlipidemia, unspecified: Secondary | ICD-10-CM

## 2019-08-04 DIAGNOSIS — M2560 Stiffness of unspecified joint, not elsewhere classified: Secondary | ICD-10-CM

## 2019-08-04 DIAGNOSIS — G4730 Sleep apnea, unspecified: Secondary | ICD-10-CM

## 2019-08-18 ENCOUNTER — Encounter: Admit: 2019-08-18 | Discharge: 2019-08-18

## 2019-08-18 ENCOUNTER — Ambulatory Visit: Admit: 2019-08-18 | Discharge: 2019-08-18

## 2019-08-18 DIAGNOSIS — Z136 Encounter for screening for cardiovascular disorders: Principal | ICD-10-CM

## 2019-09-02 NOTE — Progress Notes
Pt hospitalized since last office visit: No    Health Maintenance Due   Topic Date Due    HEPATITIS C SCREENING  04/26/1971    SHINGLES RECOMBINANT VACCINE (1 of 2) 04/26/2003       There are no diagnoses linked to this encounter.    Labs not done:      Lab Frequency Next Occurrence   CT CARDIAC CALCIUM SCORE WO CONT Once 07/19/2019       No labs due at this time.    Notes to provider:

## 2019-09-05 ENCOUNTER — Encounter: Admit: 2019-09-05 | Discharge: 2019-09-05 | Payer: BC Managed Care – PPO

## 2019-09-05 ENCOUNTER — Ambulatory Visit: Admit: 2019-09-05 | Discharge: 2019-09-06 | Payer: BC Managed Care – PPO

## 2019-09-05 MED ORDER — ZOLPIDEM 5 MG PO TAB
5 mg | ORAL_TABLET | Freq: Every evening | ORAL | 0 refills | 30.00000 days | Status: DC | PRN
Start: 2019-09-05 — End: 2020-03-01

## 2019-09-05 MED ORDER — CITALOPRAM 40 MG PO TAB
40 mg | ORAL_TABLET | Freq: Every day | ORAL | 3 refills | Status: AC
Start: 2019-09-05 — End: ?

## 2019-09-05 NOTE — Patient Instructions
Get fasting labs few days before return to clinic in 2 months

## 2019-09-05 NOTE — Progress Notes
Date of Service: 09/05/2019    Tay Huwe is a 68 y.o. male. DOB: 1951/05/06   MRN#: [id number]    Subjective: follow up       Hypertension   This is a chronic problem. The problem is controlled. Associated symptoms include malaise/fatigue. Pertinent negatives include no anxiety, blurred vision, chest pain, headaches, orthopnea, palpitations or shortness of breath.     Brack Bailin is 68 y.o. patient who presents to clinic for follow up. He established care 07/19/2019. He is a Engineer, civil (consulting) in [place]. No smoking, no alcohol, no street drugs. No regular exercise. He has 4 children.     He managed to lose intentionally 55 Lb over last 1 year by having ketogenic diet. He takes multivitamin daily.    He also struggles with depression but no intent to harm himself or others. His brother had depression and committed suicide in 2003.     He tapered off Effexor and Remeron over last 2-4 weeks and has been on Celexa 20mg  daily. He feels better but feels that the dose needs to be increased.     Medical History:   Diagnosis Date   ? Arthritis     back & shoulders   ? Hyperlipidemia    ? Hypertension    ? Joint stiffness 2009   ? Sleep apnea        Surgical History:   Procedure Laterality Date   ? VASECTOMY  1984   ? HX CATARACT REMOVAL Bilateral 02/2017    [facility]   ? MENISCECTOMY Left 2019   ? PARS PLANA MECHANICAL VITRECTOMY WITH  [place] Right 09/02/2018    Performed by Blair Hailey, MD at [facility] [place]   ? PARS PLANA MECHANICAL VITRECTOMY Left 11/04/2018    Performed by Blair Hailey, MD at [facility] [place]   ? HX ADENOIDECTOMY     ? HX TONSILLECTOMY     ? NASAL SURGERY      TWICE       family history includes Cataract in his father and mother; Depression in his brother; Heart Disease in his father.    Social History     Socioeconomic History   ? Marital status: Married     Spouse name: Not on file   ? Number of children: Not on file   ? Years of education: Not on file   ? Highest education level: Not on file Occupational History   ? Not on file   Tobacco Use   ? Smoking status: Former Smoker     Start date: 08/04/1967     Last attempt to quit: 2008     Years since quitting: 12.7   ? Smokeless tobacco: Former Estate agent and Sexual Activity   ? Alcohol use: Not Currently     Frequency: Never     Comment: seldom    ? Drug use: No   ? Sexual activity: Not on file   Other Topics Concern   ? Not on file   Social History Narrative   ? Not on file       Social History     Tobacco Use   ? Smoking status: Former Smoker     Start date: 08/04/1967     Last attempt to quit: 2008     Years since quitting: 12.7   ? Smokeless tobacco: Former Estate agent Use Topics   ? Alcohol use: Not Currently     Frequency: Never  Comment: seldom         Review of Systems   Constitution: Positive for malaise/fatigue. Negative for weight gain.   HENT: Negative for sore throat and stridor.    Eyes: Negative for blurred vision.   Cardiovascular: Negative for chest pain, orthopnea, palpitations and syncope.   Respiratory: Negative for cough, shortness of breath, snoring and wheezing.    Skin: Negative for rash.   Musculoskeletal: Negative for back pain and joint pain.   Gastrointestinal: Negative for abdominal pain, diarrhea, nausea and vomiting.   Genitourinary: Negative for dysuria, flank pain and hesitancy.   Neurological: Negative for disturbances in coordination, excessive daytime sleepiness, dizziness, focal weakness, headaches, numbness, seizures and weakness.   Psychiatric/Behavioral: Positive for depression. Negative for hallucinations and memory loss. The patient is not nervous/anxious.    Allergic/Immunologic: Negative for hives.   All other systems reviewed and are negative.      Objective:     ? ascorbic acid (VITAMIN C) 500 mg tablet Take 500 mg by mouth daily.   ? aspirin EC 81 mg tablet Take 81 mg by mouth daily. Take with food.   ? cetirizine (ZYRTEC) 10 mg tablet Take 10 mg by mouth. ? citalopram (CELEXA) 40 mg tablet Take one tablet by mouth daily.   ? cyclobenzaprine (FLEXERIL) 5 mg tablet Take 1-2 tablets by mouth daily as needed.   ? famotidine (PEPCID) 20 mg tablet Take 20 mg by mouth twice daily.   ? fluticasone propionate (FLONASE) 50 mcg/actuation nasal spray, suspension Apply 1 spray to each nostril as directed daily. Shake bottle gently before using.   ? glucosamine(+) 500 mg tab Take 1,500 mg by mouth once.   ? hydroCHLOROthiazide (HYDRODIURIL) 12.5 mg capsule Take one capsule by mouth daily. Indications: pt unsure of dosage- daily   ? lifitegrast 5 % dpet Place 1 drop into or around eye(s) twice daily.   ? losartan (COZAAR) 25 mg tablet Take one tablet by mouth daily. Indications: pt unsure of doseage   ? meloxicam (MOBIC) 15 mg tablet Take one tablet by mouth daily.   ? simvastatin (ZOCOR) 40 mg tablet Take one tablet by mouth at bedtime daily.   ? zolpidem (AMBIEN) 5 mg tablet Take one tablet by mouth at bedtime as needed for Sleep.     Vitals:    09/05/19 1556   BP: 128/82   BP Source: Arm, Left Upper   Patient Position: Sitting   Pulse: 80   Resp: 14   Temp: 36.9 ?C (98.4 ?F)   TempSrc: Oral   Weight: 79.4 kg (175 lb)   Height: 175.3 cm (69.02)   PainSc: One     Body mass index is 25.83 kg/m?Marland Kitchen     Physical Exam  Vitals signs and nursing note reviewed.   Constitutional:       General: He is not in acute distress.     Appearance: He is well-developed. He is not diaphoretic.   HENT:      Head: Normocephalic and atraumatic.      Right Ear: External ear normal.      Left Ear: External ear normal.      Nose: Nose normal.   Eyes:      General: No scleral icterus.        Right eye: No discharge.         Left eye: No discharge.      Conjunctiva/sclera: Conjunctivae normal.      Pupils: Pupils are equal, round, and reactive  to light.   Neck:      Musculoskeletal: Normal range of motion and neck supple.      Thyroid: No thyromegaly.      Vascular: No JVD. Trachea: No tracheal deviation.   Cardiovascular:      Rate and Rhythm: Normal rate and regular rhythm.      Heart sounds: Normal heart sounds. No murmur. No friction rub.   Pulmonary:      Effort: Pulmonary effort is normal. No respiratory distress.      Breath sounds: Normal breath sounds. No wheezing or rales.   Chest:      Chest wall: No tenderness.   Abdominal:      General: There is no distension.      Palpations: Abdomen is soft. There is no mass.      Tenderness: There is no abdominal tenderness. There is no guarding or rebound.   Musculoskeletal: Normal range of motion.   Lymphadenopathy:      Cervical: No cervical adenopathy.   Skin:     General: Skin is warm and dry.   Neurological:      Mental Status: He is alert and oriented to person, place, and time.      Cranial Nerves: No cranial nerve deficit.   Psychiatric:         Behavior: Behavior normal.          Assessment and Plan:      Problem   Anxiety and Depression     Hypertension:  He is on HCTZ and [place]  BP Readings from Last 3 Encounters:   09/05/19 128/82   07/19/19 135/88   11/04/18 120/78   continue  He had stress test and echo in 02/2016 that were reported as normal and I reviewed in care Every where  We ordered CT calcium score    Hyperlipidemia:  He is on Zocor 20mg  daily but did not take for 30 days. He had cramps with Lipitor.  We increased Zocor to 40mg  daily 07/19/2019  Plan on repeat labs in 3 months  Lab Results   Component Value Date    CHOL 244 (H) 07/18/2019    TRIG 157 (H) 07/18/2019    HDL 47 07/18/2019    LDL 158 (H) 07/18/2019    VLDL 31 07/18/2019    NONHDLCHOL 197 07/18/2019      Anxiety and Depression:   He had for 20 years  He is on Effexor and Remeron  He is not feeling that this combination is helping  He is homicidal or suicidal but his brother committed suicide in 2013  We got him to taper off his medications over2 weeks and we he started Celexa 20mg  daily after he tapered off  We referred to psychologist I am referring this patient to the behavior health consultant for the following conditions:  Health Risk Behaviors:  Stress management  Mental Health Conditions:  Depression and Anxiety  Will increase Celexa from 20 to 40mg  daily  Reassess in 2 months    History of sleep apnea:  He is not snoring after he lost weight    Weakness:  Normal labs and TSH and B12 level   He is on ketogenic diet    Hearing deficits:  He has hearing aids    Routine health maintenance:    Colonoscopy: 2014, normal  Tdap: 2015  Pneumovax: will give Prevnar 07/19/2019 and plan on pneumovax in a year  Influenza:09/2018  Eye exam: 2019  Dental exam: 2019  AAA screening: ordered  CT cardiac calcium score: he will schedule    Total time 25 minutes.  Estimated counseling time 15 minutes.  Counseled patient regarding depression and weakness. We discussed safety during COVID19 pandemic.     Orders Placed This Encounter   ? LIPID PROFILE   ? LIVER FUNCTION PANEL   ? citalopram (CELEXA) 40 mg tablet   ? zolpidem (AMBIEN) 5 mg tablet     Encounter Medications   Medications   ? citalopram (CELEXA) 40 mg tablet     Sig: Take one tablet by mouth daily.     Dispense:  90 tablet     Refill:  3     A change from previous dose   ? zolpidem (AMBIEN) 5 mg tablet     Sig: Take one tablet by mouth at bedtime as needed for Sleep.     Dispense:  30 tablet     Refill:  0     Future Appointments   Date Time Provider Department Center   09/21/2019  7:30 AM CT - IC [facility]1CT [facility] Radiolog   11/01/2019 10:00 AM Jinny Sanders, PhD [facility] IM     Patient Instructions   Get fasting labs few days before return to clinic in 2 months

## 2019-09-08 ENCOUNTER — Encounter: Admit: 2019-09-08 | Discharge: 2019-09-08 | Payer: BC Managed Care – PPO

## 2019-09-12 ENCOUNTER — Encounter: Admit: 2019-09-12 | Discharge: 2019-09-12 | Payer: BC Managed Care – PPO

## 2019-09-21 ENCOUNTER — Encounter: Admit: 2019-09-21 | Discharge: 2019-09-21 | Payer: BC Managed Care – PPO

## 2019-09-21 ENCOUNTER — Ambulatory Visit: Admit: 2019-09-21 | Discharge: 2019-09-21 | Payer: BC Managed Care – PPO

## 2019-09-21 DIAGNOSIS — Z136 Encounter for screening for cardiovascular disorders: Secondary | ICD-10-CM

## 2019-09-22 ENCOUNTER — Encounter: Admit: 2019-09-22 | Discharge: 2019-09-22 | Payer: BC Managed Care – PPO

## 2019-09-23 ENCOUNTER — Encounter: Admit: 2019-09-23 | Discharge: 2019-09-23 | Payer: BC Managed Care – PPO

## 2019-09-23 DIAGNOSIS — R931 Abnormal findings on diagnostic imaging of heart and coronary circulation: Secondary | ICD-10-CM

## 2019-09-23 NOTE — Telephone Encounter
Please call patient and tell him that his calcium cardiac scan showed a score of 315. I want him to see cardiology to ensure no further workup needed. His score is not too bad but I want him to be checked since he does not exercise to raise his heart rate. I placed a referral to Dr Trudee Kuster. Thanks    Orders Placed This Encounter    AMB REFERRAL TO ADULT CARDIOLOGY:GENERAL     No orders of the defined types were placed in this encounter.    Future Appointments   Date Time Provider [place]   11/01/2019 10:00 AM Elicia Lamp, PhD [facility] IM     There are no Patient Instructions on file for this visit.

## 2019-10-06 ENCOUNTER — Encounter: Admit: 2019-10-06 | Discharge: 2019-10-06 | Payer: BC Managed Care – PPO

## 2019-10-06 NOTE — Telephone Encounter
-----   Message from Mallory Shirk sent at 10/06/2019  3:41 PM CDT -----  Regarding: No records to request  New patient scheduled to see Dr. Trudee Kuster on 10/28   Per patient, Previous cardiac care at [facility] 6 years ago, had stress test and Echo.  PCP-is an internal provider.   No records to request    Thank you.

## 2019-10-14 ENCOUNTER — Ambulatory Visit: Admit: 2019-10-14 | Discharge: 2019-10-15 | Payer: BC Managed Care – PPO

## 2019-10-14 ENCOUNTER — Encounter: Admit: 2019-10-14 | Discharge: 2019-10-14 | Payer: BC Managed Care – PPO

## 2019-10-14 DIAGNOSIS — I10 Essential (primary) hypertension: Secondary | ICD-10-CM

## 2019-10-14 DIAGNOSIS — G4730 Sleep apnea, unspecified: Secondary | ICD-10-CM

## 2019-10-14 DIAGNOSIS — E785 Hyperlipidemia, unspecified: Secondary | ICD-10-CM

## 2019-10-14 DIAGNOSIS — H04123 Dry eye syndrome of bilateral lacrimal glands: Secondary | ICD-10-CM

## 2019-10-14 DIAGNOSIS — M1990 Unspecified osteoarthritis, unspecified site: Secondary | ICD-10-CM

## 2019-10-14 DIAGNOSIS — M2560 Stiffness of unspecified joint, not elsewhere classified: Secondary | ICD-10-CM

## 2019-10-14 MED ORDER — LIFITEGRAST 5 % OP DPET
1 [drp] | Freq: Two times a day (BID) | OPHTHALMIC | 11 refills | 90.00000 days | Status: AC
Start: 2019-10-14 — End: ?

## 2019-10-14 NOTE — Patient Instructions
Use artificial tears one drop four times daily to both eyes as needed for dryness  Tears can be used up to every hour. If using more than four times daily on a regular basis, be sure to use a PRESERVATIVE FREE formulation.    Artificial tears are also called lubricant drops. Suggested brands include TheraTears, Refresh, Systane, Blink, and Genteal.      Continue xiidra twice daily both eyes

## 2019-10-14 NOTE — Progress Notes
Assessment and Plan:    Problem   Dry Eye Syndrome of Both Eyes    0.7 plugs OU 11/26/17         Dry eye syndrome of both eyes  Continue xiidra twice daily     Use artificial tears one drop four times daily to both eyes as needed for dryness  Tears can be used up to every hour. If using more than four times daily on a regular basis, be sure to use a PRESERVATIVE FREE formulation.    Artificial tears are also called lubricant drops. Suggested brands include TheraTears, Refresh, Systane, Blink, and Genteal.    Recheck refraction next visit    If NI in vision, consider Muro 128 and get OCT mac           Return in about 8 weeks (around 12/09/2019) for MRx, LG - dry eye.    Corky Sox, MD  [facility]      HPI:  Patient presents with:  Eye Problem: Annual Eye Exam today. Pt is doing well, notices slight distance vision change.   Eye Pain: No pain or discomfort OU.   Medication Update: Pt continues to use Xiidra twice daily, unsure if this is helping his dryness. Using them BID.  Pt is not using any ATs in between.  Spots and/or Floaters: No floaters or flashes reported today.     Pt has noticed change in left eye.  Much happier after Dr. Alla Feeling took care of the floaters    Exam:  Base Eye Exam     Visual Acuity (Snellen - Linear)       Right Left    Dist cc 20/20 -1 20/30 -2    Dist ph cc  20/25slow    Correction:  Glasses          Tonometry (Tonopen, 8:16 AM)       Right Left    Pressure 14 15          Pupils       Dark Light React APD    Right 4.0 3.0 + 0    Left 4.0 3.0 + 0          Visual Fields       Left Right     Full Full          Extraocular Movement       Right Left     Ortho Ortho     -- -- --   --  --   -- -- --    -- -- --   --  --   -- -- --             Neuro/Psych     Oriented x3:  Yes    Mood/Affect:  Normal          Dilation     Both eyes:  1.0% Tropicamide, 2.5% Phenylephrine @ 8:16 AM            Slit Lamp and Fundus Exam     Slit Lamp Exam       Right Left Lids/Lashes Normal Normal    Conjunctiva/Sclera White and quiet White and quiet    Cornea tr ABMD changes 1+ ABMD changes    Anterior Chamber Deep and quiet Deep and quiet    Iris Flat Flat    Lens PCIOL PCIOL          Fundus Exam       Right  Left    Vitreous clear clear    Disc Sharp, healthy rim Sharp, healthy rim    C/D Ratio 0.3 0.3    Macula Flat Flat    Vessels Normal caliber and number Normal caliber and number    Periphery Attached, no breaks or tears, sup temp, inf laser Attached, no breaks or tears, sup temp, inf laser            Refraction     Wearing Rx       Sphere Cylinder Axis Add    Right [place] +0.50 045 +2.50    Left -0.50 +0.25 140 +2.50    Age:  68yr    Type:  PAL          Manifest Refraction       Sphere Cylinder Axis Dist VA Add    Right -0.25 +0.25 065 20/20 +2.50    Left -0.50 +0.50 120 20/25+2 +2.50

## 2019-10-18 ENCOUNTER — Encounter: Admit: 2019-10-18 | Discharge: 2019-10-18 | Payer: BC Managed Care – PPO

## 2019-10-18 NOTE — Telephone Encounter
Called pt to offer 3:00PM NEW PATIENT appt slot due to 4:30PM being double booked. Pt is agreeable to new time and is aware of date and location. Sent to scheduling to change time.

## 2019-10-19 ENCOUNTER — Encounter: Admit: 2019-10-19 | Discharge: 2019-10-19 | Payer: BC Managed Care – PPO

## 2019-10-19 ENCOUNTER — Ambulatory Visit: Admit: 2019-10-19 | Discharge: 2019-10-19 | Payer: BC Managed Care – PPO

## 2019-10-19 DIAGNOSIS — E785 Hyperlipidemia, unspecified: Secondary | ICD-10-CM

## 2019-10-19 DIAGNOSIS — E782 Mixed hyperlipidemia: Secondary | ICD-10-CM

## 2019-10-19 DIAGNOSIS — M2560 Stiffness of unspecified joint, not elsewhere classified: Secondary | ICD-10-CM

## 2019-10-19 DIAGNOSIS — I10 Essential (primary) hypertension: Secondary | ICD-10-CM

## 2019-10-19 DIAGNOSIS — G4730 Sleep apnea, unspecified: Secondary | ICD-10-CM

## 2019-10-19 DIAGNOSIS — R931 Abnormal findings on diagnostic imaging of heart and coronary circulation: Secondary | ICD-10-CM

## 2019-10-19 DIAGNOSIS — M1990 Unspecified osteoarthritis, unspecified site: Secondary | ICD-10-CM

## 2019-10-19 NOTE — Patient Instructions
Have labs drawn    Complete echocardiogram (ultrasound of your heart) and 2 part chemical stress test. Do not eat or drink after midnight before your stress test. Do not have caffeine for 24 hours prior to your stress test.     Stop Simvastatin (Zocor)    Please call the Cardiology GOLD Team with questions, [phone], Kelby Fam, Doroteo Bradford RN, Wilford Sports, RN, [facility] LPN.     Please allow 5-7 business days for Dr Trudee Kuster to review your results. All normal results will go to MyChart. If you do not have Mychart, it is strongly recommended to get this so you can easily view all your results. If you do not have mychart, we will attempt to call you once with normal lab and testing results. If we cannot reach you by phone with normal results, we will send you a letter. We will always call you with abnormal lab/test results.

## 2019-10-20 ENCOUNTER — Encounter: Admit: 2019-10-20 | Discharge: 2019-10-20 | Payer: BC Managed Care – PPO

## 2019-10-20 DIAGNOSIS — D649 Anemia, unspecified: Secondary | ICD-10-CM

## 2019-10-20 NOTE — Telephone Encounter
I spoke to patient and gave recommendations. He will get his stool samples completed at [facility] in [place]. Patient will stop HCTZ, Follow up with testing that is already scheduled. Confirmed 11/28/19 at 1345 OV with REG.     I called St [id number]) [phone] and outpatient lab is closed. Will call back tomorrow. Iron studies pending.

## 2019-10-20 NOTE — Telephone Encounter
-----   Message from Henrene Hawking, MD sent at 10/20/2019  4:27 PM CDT -----  Treyton Slimp-the hemoglobin is lower.  Please have him check iron stores and ferritin please have him check stools for occult blood x2.  The LDL cholesterol is elevated suggesting ineffective/suboptimal treatment from the simvastatin.  His LP(a) was within normal limits.  His creatinine has increased.  Please have him discontinue the hydrochlorothiazide.  We will need follow-up office visit to discuss next steps for his cholesterol and blood pressure management.  Thanks RG

## 2019-10-21 MED ORDER — FERROUS SULFATE 325 MG (65 MG IRON) PO TAB
325 mg | ORAL_TABLET | Freq: Two times a day (BID) | ORAL | 1 refills | Status: DC
Start: 2019-10-21 — End: 2019-12-20

## 2019-10-21 MED ORDER — FOLIC ACID 1 MG PO TAB
1 mg | ORAL_TABLET | Freq: Every day | ORAL | 1 refills | Status: DC
Start: 2019-10-21 — End: 2020-04-19

## 2019-11-01 ENCOUNTER — Ambulatory Visit: Admit: 2019-11-01 | Discharge: 2019-11-01 | Payer: BC Managed Care – PPO

## 2019-11-01 ENCOUNTER — Encounter: Admit: 2019-11-01 | Discharge: 2019-11-01 | Payer: BC Managed Care – PPO

## 2019-11-01 DIAGNOSIS — F419 Anxiety disorder, unspecified: Secondary | ICD-10-CM

## 2019-11-01 NOTE — Progress Notes
Jared Hebert is a 68 y.o.  male seen for an integrative behavioral health visit.    Visit Performed:Face to Face    Referring Physician: [facility] Calk[facility], Lucile Crater, MD    [facility] Provider name: Jesse Fall, PhD    Focus of Visit: Mental Health  Anxiety and Depression     Additional Comments: Introduced IBH services and discussed limits of confidentiality, including that progress note will be written in medical chart, there will be open communication with PCP, and that a trainee may be involved in patient's care for future appts. Pt agreed to proceed with treatment through [facility] services.     Patient completed mood screener for depression and anxiety (GAD-7: Total Score: : 13 moderate anxiety sxs present, impact on psychosocial fxning Somewhat difficult; PHQ-2 Score: 6, PHQ-9 Score: 16, moderately severe depression sxs present, impact on psychosocial fxning Somewhat difficult, SI endorsed Not at All over the past two weeks.).      Contextual Interview - Life Snapshot:  Love Living situation - where/with whom?  If Pt has sig other, how long?  Overall how are things at home?  How are the relationship(s) going?    Do you have loving relationships with family or friends (in the area, long-distance)?  Anyone who stands out as most important in your life (VIPs)?   Lives with Jared Hebert (62yrs) and two dogs.    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  This has impacted him she her abilities have declined and he has had to take on more responsibilities.      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     Work What do you do for work/study?  Do you enjoy it? Had planned to retire May 2020, which is on hold because of finances and uncertainty as to whether Jared Hebert's insurance claim will be reimbursed and whether or not they will need to challenge this in court.  Pt works as a Geologist, engineering at [employer].  He previously was a Curator for 31yrs prior to nursing.  He has enjoyed these roles.        For retirement he wants to build a duplex in [place] and move there and rent out the other unit.  This would allow him opportunity to get equipment to cut wood and more time to enjoy woodworking projects.     Play What do you do for fun?  For relaxation?  For connecting with people in your neighborhood or community? Woodworking and Holiday representative are passions of his, but he has had less desire for this lately.  Has tried to keep it up, but less so from before.    Previously was more social, but less so given busyness with Jared Hebert's needs, more withdrawal due to stress, and COVID-19 restrictions.   Health Do you use caffeine, alcohol, tobacco products?   Marijuana? Do you use any other drugs (cocaine, meth)?   Do you exercise on a regular basis for your health?   How many meals each day?  Do you feel you eat healthy?   Do you sleep well - how many hours? Was a former smoker.  Best part of the day previously was having a cigar in the shop and woodworking.  He  quit for health reasons.  He quit ETOH use for health reasons as well.  His Jared Hebert continues to smoke, but he remains glad he has quit.     Lives on plot of land that requires maintenance, so physical activity is outside with dogs, walking, and caring for property.     Sleeps for brief periods of time (2 hrs), will get up when unable to sleep and reads.     Other Any other important/relevant info shared by Pt? Disability assessment for Jared Hebert was delayed from March to October 2020 because of COVID.      Contextual Interview - Presenting Problem:  Problem What is the problem?  How do you recognize the problem when it's happening (e.g., symptoms, behavioral changes)? How does it impact your day-to-day life, what does it keep you from or how what does it make it harder for you to do? Worse mood and anxiety symptoms, more withdrawal from others, anhedonia and less enjoyment of woodworking.     Time When did this start?   How often does it happen?   What happens before / after the problem?   Why do you think it is a problem now?    Trigger Is there anything--a situation or a person--that seems to set it off?    Trajectory What?s this problem been like over time?   Have there been times when it was less of a concern?   More of a concern?   And recently . . . getting worse, better?    Workability What have you tried so far to address this problem?  Has it been helpful, unhelpful?  What are you doing now?  Would do you think would be helpful now? Sleep has been terrible since antidepressant medication was changed to celexa.       Specific Intervention: Behavioral activation, pleasurable activities, Psychoeducation and Supportive Counseling   Provided supportive counseling to validate Pt's emotional response(s) and reinforce Pt's coping efforts.  Provided education on concept of self-compassion in reaction to Pt's elevated symptoms in the context of various stressors, delay of future plans that he has prepared for for years and looked forward to enjoying with his Jared Hebert, anticipatory grief in context of her dementia with unclear prognosis.  Provided psychoeducation for depression cycle and contributing biopsychosocial factors.  Watched psychoeducational video on behavioral activation and more routine incorporation of pleasant events.  Provided psychoeducational worksheet for development of depression mgmt action plan.    Educational Handouts Provided/Worksheets Assigned:  CIH Action Plan for Depression     Plan/Recommendation: Financial planner, Return in about 3 months (around 01/25/2020) for [facility] Clinical Support appt. Pt aware IBH service will be transitioning to Dr. Rock Nephew. At next visit, assess Pt's progress toward goals identified below.     Patient goals: To improve self-management of my caregiver stress, depression and anxiety, I will complete the assigned worksheet(s), implement action plan of reconnecting to social support and prioritizing time for woodworking to maintain self-care and prevent depression cycle from taking hold and maintain adherence to medication.    Length of visit: > 30 (50) minutes

## 2019-11-04 ENCOUNTER — Encounter: Admit: 2019-11-04 | Discharge: 2019-11-04 | Payer: BC Managed Care – PPO

## 2019-11-08 ENCOUNTER — Encounter: Admit: 2019-11-08 | Discharge: 2019-11-08 | Payer: BC Managed Care – PPO

## 2019-11-10 ENCOUNTER — Ambulatory Visit: Admit: 2019-11-10 | Discharge: 2019-11-10 | Payer: BC Managed Care – PPO

## 2019-11-10 ENCOUNTER — Encounter: Admit: 2019-11-10 | Discharge: 2019-11-10 | Payer: BC Managed Care – PPO

## 2019-11-10 DIAGNOSIS — E782 Mixed hyperlipidemia: Secondary | ICD-10-CM

## 2019-11-10 DIAGNOSIS — I10 Essential (primary) hypertension: Secondary | ICD-10-CM

## 2019-11-10 DIAGNOSIS — R931 Abnormal findings on diagnostic imaging of heart and coronary circulation: Secondary | ICD-10-CM

## 2019-11-10 MED ORDER — AMINOPHYLLINE 500 MG/20 ML IV SOLN
50 mg | INTRAVENOUS | 0 refills | Status: AC | PRN
Start: 2019-11-10 — End: ?

## 2019-11-10 MED ORDER — REGADENOSON 0.4 MG/5 ML IV SYRG
.4 mg | Freq: Once | INTRAVENOUS | 0 refills | Status: CP
Start: 2019-11-10 — End: ?
  Administered 2019-11-10: 16:00:00 0.4 mg via INTRAVENOUS

## 2019-11-10 MED ORDER — ALBUTEROL SULFATE 90 MCG/ACTUATION IN HFAA
2 | RESPIRATORY_TRACT | 0 refills | Status: DC | PRN
Start: 2019-11-10 — End: 2019-11-15

## 2019-11-10 MED ORDER — SODIUM CHLORIDE 0.9 % IV SOLP
250 mL | INTRAVENOUS | 0 refills | Status: AC | PRN
Start: 2019-11-10 — End: ?

## 2019-11-10 MED ORDER — NITROGLYCERIN 0.4 MG SL SUBL
.4 mg | SUBLINGUAL | 0 refills | Status: DC | PRN
Start: 2019-11-10 — End: 2019-11-15

## 2019-11-10 MED ORDER — EUCALYPTUS-MENTHOL MM LOZG
1 | Freq: Once | ORAL | 0 refills | Status: AC | PRN
Start: 2019-11-10 — End: ?

## 2019-11-11 ENCOUNTER — Encounter: Admit: 2019-11-11 | Discharge: 2019-11-11 | Payer: BC Managed Care – PPO

## 2019-11-15 ENCOUNTER — Encounter: Admit: 2019-11-15 | Discharge: 2019-11-15 | Payer: BC Managed Care – PPO

## 2019-11-28 ENCOUNTER — Encounter: Admit: 2019-11-28 | Discharge: 2019-11-28 | Payer: BC Managed Care – PPO

## 2019-12-08 ENCOUNTER — Encounter: Admit: 2019-12-08 | Discharge: 2019-12-08 | Payer: BC Managed Care – PPO

## 2019-12-15 ENCOUNTER — Encounter: Admit: 2019-12-15 | Discharge: 2019-12-15 | Payer: BC Managed Care – PPO

## 2019-12-15 NOTE — Progress Notes
Pt hospitalized since last office visit: No    Health Maintenance Due   Topic Date Due    HEPATITIS C SCREENING  04/26/1971    SHINGLES RECOMBINANT VACCINE (1 of 2) 04/26/2003    INFLUENZA VACCINE  07/23/2019       There are no diagnoses linked to this encounter.    Labs not done:      Lab Frequency Next Occurrence   LIPID PROFILE Once 10/05/2019   LIVER FUNCTION PANEL Once 10/05/2019   OCCULT BLOOD NON COLON CANCER SCREEN Once 10/20/2019   OCCULT BLOOD NON COLON CANCER SCREEN Once 10/20/2019   REQUEST FOR CARDIOLOGY APPOINTMENT Once 11/20/2019   CBC AND DIFF Once 01/21/2020   IRON + BINDING CAPACITY + %SAT+ FERRITIN Once 01/21/2020       MyChart message sent to patient on 12/24/20Angie Glennon Mac, RN     Notes to provider:

## 2019-12-20 ENCOUNTER — Encounter: Admit: 2019-12-20 | Discharge: 2019-12-20 | Payer: BC Managed Care – PPO

## 2019-12-20 ENCOUNTER — Ambulatory Visit: Admit: 2019-12-20 | Discharge: 2019-12-21 | Payer: BC Managed Care – PPO

## 2019-12-20 DIAGNOSIS — M1990 Unspecified osteoarthritis, unspecified site: Secondary | ICD-10-CM

## 2019-12-20 DIAGNOSIS — I10 Essential (primary) hypertension: Secondary | ICD-10-CM

## 2019-12-20 DIAGNOSIS — G4730 Sleep apnea, unspecified: Secondary | ICD-10-CM

## 2019-12-20 DIAGNOSIS — E782 Mixed hyperlipidemia: Secondary | ICD-10-CM

## 2019-12-20 DIAGNOSIS — M2560 Stiffness of unspecified joint, not elsewhere classified: Secondary | ICD-10-CM

## 2019-12-20 DIAGNOSIS — F419 Anxiety disorder, unspecified: Secondary | ICD-10-CM

## 2019-12-20 DIAGNOSIS — F5101 Primary insomnia: Secondary | ICD-10-CM

## 2019-12-20 DIAGNOSIS — E785 Hyperlipidemia, unspecified: Secondary | ICD-10-CM

## 2019-12-20 DIAGNOSIS — M545 Low back pain: Secondary | ICD-10-CM

## 2019-12-20 MED ORDER — NAPROXEN 500 MG PO TAB
500 mg | ORAL_TABLET | Freq: Two times a day (BID) | ORAL | 0 refills | Status: AC
Start: 2019-12-20 — End: ?

## 2019-12-20 NOTE — Progress Notes
Date of Service: 12/20/2019    Jared Hebert is a 68 y.o. male. DOB: [birth year]   MRN#: [id number]    Subjective: follow up       Hypertension  This is a chronic problem. The problem is controlled. Pertinent negatives include no anxiety, blurred vision, chest pain, headaches, malaise/fatigue, orthopnea, palpitations or shortness of breath.   Back Pain  This is a chronic problem. The current episode started more than 1 month ago. The problem occurs intermittently. The pain is at a severity of 5/10. The pain is moderate. The pain is worse during the day. The symptoms are aggravated by bending and standing. Pertinent negatives include no abdominal pain, chest pain, dysuria, headaches, numbness or weakness. He has tried chiropractic manipulation and NSAIDs for the symptoms. The treatment provided no relief.     Jared Hebert is 68 y.o. patient who presents to clinic for follow up. He established care 07/19/2019. He is a Engineer, civil (consulting) in [place]. No smoking, no alcohol, no street drugs. No regular exercise. He has 4 children.     He was last seen 08/2019    He is in clinic for low back pain for 6 weeks as above. 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     He managed to lose intentionally 55 Lb over last 1 year by having ketogenic diet. He takes multivitamin daily.    He also struggles with depression but no intent to harm himself or others. His brother had depression and committed suicide in 2003.     He tapered off Effexor and Remeron over last 2-4 weeks and has been on Celexa 20mg  daily. He feels better but feels that the dose needs to be increased.     Medical History:   Diagnosis Date   ? Arthritis     back & shoulders   ? Hyperlipidemia    ? Hypertension    ? Joint stiffness 2009   ? Sleep apnea        Surgical History:   Procedure Laterality Date   ? VASECTOMY  1984   ? HX CATARACT REMOVAL Bilateral 02/2017    [facility]   ? MENISCECTOMY Left 2019 ? PARS PLANA MECHANICAL VITRECTOMY WITH  [place] Right 09/02/2018    Performed by Blair Hailey, MD at [facility] [place]   ? PARS PLANA MECHANICAL VITRECTOMY Left 11/04/2018    Performed by Blair Hailey, MD at [facility] [place]   ? HX ADENOIDECTOMY     ? HX TONSILLECTOMY     ? NASAL SURGERY      TWICE       family history includes Cataract in his father and mother; Depression in his brother; Heart Disease in his father.    Social History     Socioeconomic History   ? Marital status: Married     Spouse name: Not on file   ? Number of children: Not on file   ? Years of education: Not on file   ? Highest education level: Not on file   Occupational History   ? Not on file   Tobacco Use   ? Smoking status: Former Smoker     Start date: 08/04/1967     Quit date: 2008     Years since quitting: 13.0   ? Smokeless tobacco: Former Estate agent and Sexual Activity   ? Alcohol use: Not Currently     Frequency: Never  Comment: seldom    ? Drug use: No   ? Sexual activity: Not on file   Other Topics Concern   ? Not on file   Social History Narrative   ? Not on file       Social History     Tobacco Use   ? Smoking status: Former Smoker     Start date: 08/04/1967     Quit date: 2008     Years since quitting: 13.0   ? Smokeless tobacco: Former Estate agent Use Topics   ? Alcohol use: Not Currently     Frequency: Never     Comment: seldom         Review of Systems   Constitution: Negative for malaise/fatigue and weight gain.   HENT: Negative for sore throat and stridor.    Eyes: Negative for blurred vision.   Cardiovascular: Negative for chest pain, orthopnea, palpitations and syncope.   Respiratory: Negative for cough, shortness of breath, snoring and wheezing.    Skin: Negative for rash.   Musculoskeletal: Positive for back pain. Negative for joint pain.   Gastrointestinal: Negative for abdominal pain, diarrhea, nausea and vomiting.   Genitourinary: Negative for dysuria, flank pain and hesitancy. Neurological: Negative for disturbances in coordination, excessive daytime sleepiness, dizziness, focal weakness, headaches, numbness, seizures and weakness.   Psychiatric/Behavioral: Negative for depression, hallucinations and memory loss. The patient is not nervous/anxious.    Allergic/Immunologic: Negative for hives.   All other systems reviewed and are negative.      Objective:     ? ascorbic acid (VITAMIN C) 500 mg tablet Take 500 mg by mouth daily.   ? aspirin EC 81 mg tablet Take 81 mg by mouth daily. Take with food.   ? cetirizine (ZYRTEC) 10 mg tablet Take 10 mg by mouth.   ? citalopram (CELEXA) 40 mg tablet Take one tablet by mouth daily.   ? cyclobenzaprine (FLEXERIL) 5 mg tablet Take 1-2 tablets by mouth daily as needed.   ? famotidine (PEPCID) 20 mg tablet Take 20 mg by mouth twice daily.   ? fluticasone propionate (FLONASE) 50 mcg/actuation nasal spray, suspension Apply 1 spray to each nostril as directed daily. Shake bottle gently before using.   ? folic acid (FOLVITE) 1 mg tablet Take one tablet by mouth daily.   ? glucosamine(+) 500 mg tab Take 1,500 mg by mouth once.   ? lifitegrast (XIIDRA) 5 % ophthalmic drop Apply one drop to both eyes twice daily.   ? losartan (COZAAR) 25 mg tablet Take one tablet by mouth daily. Indications: pt unsure of doseage   ? meloxicam (MOBIC) 15 mg tablet Take one tablet by mouth daily.   ? naproxen (NAPROSYN) 500 mg tablet Take one tablet by mouth twice daily with meals for 10 days. Take with food.   ? zolpidem (AMBIEN) 5 mg tablet Take one tablet by mouth at bedtime as needed for Sleep.     Vitals:    12/20/19 0856   BP: 131/87   BP Source: Arm, Right Upper   Patient Position: Sitting   Pulse: 81   PainSc: Four     There is no height or weight on file to calculate BMI.     Physical Exam  Vitals signs and nursing note reviewed.   Constitutional:       General: He is not in acute distress.     Appearance: He is well-developed. He is not diaphoretic.   HENT: Head: Normocephalic and  atraumatic.      Right Ear: External ear normal.      Left Ear: External ear normal.      Nose: Nose normal.   Eyes:      General: No scleral icterus.        Right eye: No discharge.         Left eye: No discharge.      Conjunctiva/sclera: Conjunctivae normal.      Pupils: Pupils are equal, round, and reactive to light.   Neck:      Musculoskeletal: Normal range of motion and neck supple.      Thyroid: No thyromegaly.      Vascular: No JVD.      Trachea: No tracheal deviation.   Cardiovascular:      Rate and Rhythm: Normal rate and regular rhythm.      Heart sounds: Normal heart sounds. No murmur. No friction rub.   Pulmonary:      Effort: Pulmonary effort is normal. No respiratory distress.      Breath sounds: Normal breath sounds. No wheezing or rales.   Chest:      Chest wall: No tenderness.   Abdominal:      General: There is no distension.      Palpations: Abdomen is soft. There is no mass.      Tenderness: There is no abdominal tenderness. There is no guarding or rebound.   Musculoskeletal: Normal range of motion.   Lymphadenopathy:      Cervical: No cervical adenopathy.   Skin:     General: Skin is warm and dry.   Neurological:      Mental Status: He is alert and oriented to person, place, and time.      Cranial Nerves: No cranial nerve deficit.   Psychiatric:         Behavior: Behavior normal.          Assessment and Plan:      Problem   Chronic Left-Sided Low Back Pain Without Sciatica     Chronic low back pain for 6 weeks:  He went to chiroparcter and took NSAID and did home exercises  Will start Naproxen 500mg  bid for 10 days  Will refer to PM&R as well as PT.     Hypertension:  He is on HCTZ and Losarten  BP Readings from Last 3 Encounters:   12/20/19 131/87   11/10/19 138/84   10/19/19 (!) 142/90   continue  He had stress test and echo in 02/2016 that were reported as normal and I reviewed in care Every where  We ordered CT calcium score which he did 08/2019 Coronary calcification:  CT calcium scan 08/2019:  Quantitative Coronary Calcium Score:   Left main: 0   LAD: 114   LCX: 144   RCA: 5 7   Total calcium score: 315   He saw cardiology and had echo and normal stress test 10/2019 and has a follow up 12/2019    Hyperlipidemia:  He is on Zocor 20mg  daily but did not take for 30 days. He had cramps with Lipitor.  We increased Zocor to 40mg  daily 07/19/2019  Plan on repeat labs in 3 months  Lab Results   Component Value Date    CHOL 196 10/19/2019    TRIG 171 (H) 10/19/2019    HDL 48 10/19/2019    LDL 128 (H) 10/19/2019    VLDL 34 10/19/2019    NONHDLCHOL 148 10/19/2019      Anxiety  and Depression:   He had for 20 years  He is on Effexor and Remeron  He is not feeling that this combination is helping  He is homicidal or suicidal but his brother committed suicide in 2013  We got him to taper off his medications over2 weeks and we he started Celexa 20mg  daily after he tapered off  We referred to psychologist  I am referring this patient to the behavior health consultant for the following conditions:  Health Risk Behaviors:  Stress management  Mental Health Conditions:  Depression and Anxiety  We increased Celexa from 20 to 40mg  daily back in 08/2019  He is doing better  Reassess in 2 months    History of sleep apnea:  He is not snoring after he lost weight    Weakness:  Normal labs and TSH and B12 level   He is on ketogenic diet    Hearing deficits:  He has hearing aids    Routine health maintenance:    Colonoscopy: 2014, normal  Tdap: 2015  Pneumovax: Prevnar 07/19/2019 and plan on pneumovax in a year  Influenza:09/2018, 10/2019  Eye exam: 2019  Dental exam: 2019  AAA screening: 07/2019    Total time 25 minutes.  Estimated counseling time 15 minutes.  Counseled patient regarding low back pain, depression and weakness. We discussed safety during COVID19 pandemic.     Orders Placed This Encounter   ? AMB REFERRAL TO PHYSICAL MEDICINE REHAB   ? AMB REFERRAL TO PHYSICAL THERAPY ? naproxen (NAPROSYN) 500 mg tablet     Encounter Medications   Medications   ? naproxen (NAPROSYN) 500 mg tablet     Sig: Take one tablet by mouth twice daily with meals for 10 days. Take with food.     Dispense:  20 tablet     Refill:  0     Future Appointments   Date Time Provider Department Center   12/27/2019  1:00 PM Reubin Milan, MD [facility] CVM Exam   01/25/2020  8:30 AM Loletta Specter [facility]     Patient Instructions       Back Exercises: Lower Back Rotation    To start, lie on your back with your knees bent and feet flat on the floor. Don?t press your neck or lower back to the floor. Breathe deeply. You should feel comfortable and relaxed in this position.  ? Drop both knees to one side. Turn your head to the other side. Keep your shoulders flat on the floor.  ? Do not push through pain.  ? Hold for?20?seconds.  ? Slowly switch sides.  ? Repeat?2 to 5?times.  StayWell last reviewed this educational content on 02/19/2017  ? 2000-2020 [employer], [place]. All rights reserved. This information is not intended as a substitute for professional medical care. Always follow your healthcare professional's instructions.        Back Exercises: Lower Back Stretch    To start, sit in a chair with your feet flat on the floor. Shift your weight slightly forward. Relax, and keep your ears, shoulders, and hips aligned while you do the following:   ? Sit with your feet well apart.  ? Bend forward and touch the floor with the backs of your hands. Relax and let your body drop.  ? Hold for? 20?seconds. Return to starting position.  ? Repeat? 2?times.?  Note: If you've had back or hip surgery, talk with your healthcare provider before doing this stretch.   StayWell  last reviewed this educational content on 03/23/2019  ? 2000-2020 [employer], [place]. All rights reserved. This information is not intended as a substitute for professional medical care. Always follow your healthcare professional's instructions. Back Exercises: Partial Curl-Ups    To start, lie on your back with your knees bent and feet flat on the floor. Don?t press your neck or lower back to the floor. Breathe deeply. You should feel comfortable and relaxed in this position:  ? Cross your arms loosely.  ? Tighten your abdomen and curl halfway up, keeping your head in line with your shoulders.  ? Hold for 5 seconds. Uncurl to lie down.  ? Repeat 2 sets of 10.?  StayWell last reviewed this educational content on 02/19/2017  ? 2000-2020 [employer], [place]. All rights reserved. This information is not intended as a substitute for professional medical care. Always follow your healthcare professional's instructions.        Back Exercises: Back Release  Do this exercise on your hands and knees. Keep your knees under your hips and your hands under your shoulders.      ? Relax your abdominal and buttocks muscles, lift your head, and let your back sag. Be sure to keep your weight evenly distributed. Don?t sit back on your hips.?  ? Hold for?5?seconds.  ? Return to starting position.  ? Tuck your head and lift (arch) your back.  ? Hold for 5 seconds  ? Return to starting position.  ? Repeat?5?times.  StayWell last reviewed this educational content on 02/19/2017  ? 2000-2020 [employer], [place]. All rights reserved. This information is not intended as a substitute for professional medical care. Always follow your healthcare professional's instructions.        Back Exercises: Back Press    Do this exercise on your hands and knees. Keep your knees under your hips and your hands under your shoulders. Keep your spine in a neutral position (not arched or sagging). Be sure to maintain your neck?s natural curve:  ? Tighten your?stomach and buttock muscles to press your back upward. Let your head drop slightly.  ? Hold for?5?seconds. Return to starting position.  ? Repeat?5?times.  StayWell last reviewed this educational content on 02/19/2017 ? 2000-2020 [employer], [place]. All rights reserved. This information is not intended as a substitute for professional medical care. Always follow your healthcare professional's instructions.

## 2019-12-20 NOTE — Patient Instructions
Back Exercises: Lower Back Rotation    To start, lie on your back with your knees bent and feet flat on the floor. Don?t press your neck or lower back to the floor. Breathe deeply. You should feel comfortable and relaxed in this position.  ? Drop both knees to one side. Turn your head to the other side. Keep your shoulders flat on the floor.  ? Do not push through pain.  ? Hold for?20?seconds.  ? Slowly switch sides.  ? Repeat?2 to 5?times.  StayWell last reviewed this educational content on 02/19/2017  ? 2000-2020 [employer], [place]. All rights reserved. This information is not intended as a substitute for professional medical care. Always follow your healthcare professional's instructions.        Back Exercises: Lower Back Stretch    To start, sit in a chair with your feet flat on the floor. Shift your weight slightly forward. Relax, and keep your ears, shoulders, and hips aligned while you do the following:   ? Sit with your feet well apart.  ? Bend forward and touch the floor with the backs of your hands. Relax and let your body drop.  ? Hold for? 20?seconds. Return to starting position.  ? Repeat? 2?times.?  Note: If you've had back or hip surgery, talk with your healthcare provider before doing this stretch.   StayWell last reviewed this educational content on 03/23/2019  ? 2000-2020 [employer], [place]. All rights reserved. This information is not intended as a substitute for professional medical care. Always follow your healthcare professional's instructions.        Back Exercises: Partial Curl-Ups    To start, lie on your back with your knees bent and feet flat on the floor. Don?t press your neck or lower back to the floor. Breathe deeply. You should feel comfortable and relaxed in this position:  ? Cross your arms loosely.  ? Tighten your abdomen and curl halfway up, keeping your head in line with your shoulders.  ? Hold for 5 seconds. Uncurl to lie down.  ? Repeat 2 sets of 10.? StayWell last reviewed this educational content on 02/19/2017  ? 2000-2020 [employer], [place]. All rights reserved. This information is not intended as a substitute for professional medical care. Always follow your healthcare professional's instructions.        Back Exercises: Back Release  Do this exercise on your hands and knees. Keep your knees under your hips and your hands under your shoulders.      ? Relax your abdominal and buttocks muscles, lift your head, and let your back sag. Be sure to keep your weight evenly distributed. Don?t sit back on your hips.?  ? Hold for?5?seconds.  ? Return to starting position.  ? Tuck your head and lift (arch) your back.  ? Hold for 5 seconds  ? Return to starting position.  ? Repeat?5?times.  StayWell last reviewed this educational content on 02/19/2017  ? 2000-2020 [employer], [place]. All rights reserved. This information is not intended as a substitute for professional medical care. Always follow your healthcare professional's instructions.        Back Exercises: Back Press    Do this exercise on your hands and knees. Keep your knees under your hips and your hands under your shoulders. Keep your spine in a neutral position (not arched or sagging). Be sure to maintain your neck?s natural curve:  ? Tighten your?stomach and buttock muscles to press your back upward.  Let your head drop slightly.  ? Hold for?5?seconds. Return to starting position.  ? Repeat?5?times.  StayWell last reviewed this educational content on 02/19/2017  ? 2000-2020 [employer], [place]. All rights reserved. This information is not intended as a substitute for professional medical care. Always follow your healthcare professional's instructions.

## 2019-12-20 NOTE — Progress Notes
Patient stated that he has been experiencing back pain for the last month. It is worse at night. The pain begins behind the shoulder blades and radiates through to the left of the breast bone. Clayton Lefort, RN

## 2019-12-21 ENCOUNTER — Encounter: Admit: 2019-12-21 | Discharge: 2019-12-21 | Payer: BC Managed Care – PPO

## 2019-12-21 DIAGNOSIS — D649 Anemia, unspecified: Secondary | ICD-10-CM

## 2019-12-21 DIAGNOSIS — G8929 Other chronic pain: Secondary | ICD-10-CM

## 2019-12-27 ENCOUNTER — Encounter: Admit: 2019-12-27 | Discharge: 2019-12-27 | Payer: BC Managed Care – PPO

## 2019-12-27 ENCOUNTER — Ambulatory Visit: Admit: 2019-12-27 | Discharge: 2019-12-27 | Payer: BC Managed Care – PPO

## 2019-12-27 DIAGNOSIS — M1990 Unspecified osteoarthritis, unspecified site: Secondary | ICD-10-CM

## 2019-12-27 DIAGNOSIS — G4730 Sleep apnea, unspecified: Secondary | ICD-10-CM

## 2019-12-27 DIAGNOSIS — I10 Essential (primary) hypertension: Secondary | ICD-10-CM

## 2019-12-27 DIAGNOSIS — R931 Abnormal findings on diagnostic imaging of heart and coronary circulation: Secondary | ICD-10-CM

## 2019-12-27 DIAGNOSIS — G4733 Obstructive sleep apnea (adult) (pediatric): Secondary | ICD-10-CM

## 2019-12-27 DIAGNOSIS — E782 Mixed hyperlipidemia: Secondary | ICD-10-CM

## 2019-12-27 DIAGNOSIS — M2560 Stiffness of unspecified joint, not elsewhere classified: Secondary | ICD-10-CM

## 2019-12-27 DIAGNOSIS — E785 Hyperlipidemia, unspecified: Secondary | ICD-10-CM

## 2019-12-27 LAB — COMPREHENSIVE METABOLIC PANEL
Lab: 0.4 mg/dL (ref 0.3–1.2)
Lab: 0.7 mg/dL — ABNORMAL HIGH (ref <100)
Lab: 11 K/UL (ref 3–12)
Lab: 142 MMOL/L (ref 137–147)
Lab: 25 MMOL/L (ref 21–30)
Lab: 25 mg/dL (ref >40)
Lab: 4.2 MMOL/L (ref 3.5–5.1)
Lab: 70 U/L (ref 25–110)
Lab: 86 mg/dL — ABNORMAL HIGH (ref <150)
Lab: >60 mL/min (ref >60)
Lab: >60 mL/min (ref >60)

## 2019-12-27 LAB — CBC AND DIFF
Lab: 0 10*3/uL (ref 0–0.20)
Lab: 2.3 K/UL (ref 1.8–7.0)
Lab: 37 % (ref 24–44)
Lab: 4.5 M/UL (ref 4.4–5.5)
Lab: 5.2 10*3/uL (ref 4.5–11.0)
Lab: 6 % — ABNORMAL HIGH (ref 0–5)

## 2019-12-27 LAB — LIPID PROFILE
Lab: 100 mg/dL (ref 8.5–10.6)
Lab: 190 mg/dL — ABNORMAL LOW (ref 6.0–8.0)
Lab: 233 mg/dL — ABNORMAL HIGH (ref <200)

## 2019-12-27 LAB — IRON + BINDING CAPACITY + %SAT+ FERRITIN
Lab: 105 ug/dL (ref 50–185)
Lab: 23 ng/mL — ABNORMAL LOW (ref 30–300)
Lab: 27 % — ABNORMAL LOW (ref 28–42)
Lab: 393 ug/dL — ABNORMAL HIGH (ref 270–380)

## 2019-12-27 MED ORDER — ROSUVASTATIN 10 MG PO TAB
10 mg | ORAL_TABLET | Freq: Every day | ORAL | 3 refills | 90.00000 days | Status: DC
Start: 2019-12-27 — End: 2020-03-28

## 2019-12-27 NOTE — Patient Instructions
We would like to see you again for a follow up visit in 3 months with Dr. Vivianne Spence.    Please have labs drawn today.    Dr. Vivianne Spence would like for you to have a CTA. Please call [phone] to schedule this test.    Dr. Vivianne Spence would like for you to start taking Crestor 10 mg daily. We will have you draw follow up labs in 3 months.     Please call us with questions, [phone].    Please allow 5-7 business days on all results. We will attempt to call you once with normal lab and testing results. If we cannot reach you by phone with normal results, we will send you a letter. We will always call you with abnormal lab/test results.

## 2019-12-28 NOTE — Telephone Encounter
I spoke to patient and let him know recommendations - patient willing to do iron infusions. He is willing to do them at [facility]. I let him know I would look into a one dose option vs a 3 dose option since he lives in [place]. Patient will think about if he wants to have GI procedure at [place] vs with his previous provider who has done a scope on him in the past. That GI Physician is: Dr. Roby Lofts. Propeck at [facility]. He will call us back when he makes his decision.

## 2019-12-28 NOTE — Telephone Encounter
-----   Message from Reubin Milan, MD sent at 12/28/2019  6:11 AM CST -----  Besnik Febus-please have him get iron infusion.  He was intolerant to oral iron.  Please have him get EGD and colonoscopy.  Thanks RG

## 2020-01-03 MED ORDER — ACETAMINOPHEN 500 MG PO TAB
500 mg | Freq: Once | ORAL | 0 refills | Status: CN
Start: 2020-01-03 — End: ?

## 2020-01-03 MED ORDER — IRON DEXTRAN IVPB
25 mg | Freq: Once | INTRAVENOUS | 0 refills | Status: CN
Start: 2020-01-03 — End: ?

## 2020-01-03 MED ORDER — IRON DEXTRAN IVPB
975 mg | Freq: Once | INTRAVENOUS | 0 refills | Status: CN
Start: 2020-01-03 — End: ?

## 2020-01-03 MED ORDER — DIPHENHYDRAMINE HCL 25 MG PO CAP
25 mg | Freq: Once | ORAL | 0 refills | Status: CN
Start: 2020-01-03 — End: ?

## 2020-01-10 ENCOUNTER — Encounter: Admit: 2020-01-10 | Discharge: 2020-01-10 | Payer: BC Managed Care – PPO

## 2020-01-12 ENCOUNTER — Encounter: Admit: 2020-01-12 | Discharge: 2020-01-12 | Payer: BC Managed Care – PPO

## 2020-01-12 ENCOUNTER — Ambulatory Visit: Admit: 2020-01-12 | Discharge: 2020-01-12 | Payer: BC Managed Care – PPO

## 2020-01-12 DIAGNOSIS — E782 Mixed hyperlipidemia: Secondary | ICD-10-CM

## 2020-01-12 DIAGNOSIS — I10 Essential (primary) hypertension: Secondary | ICD-10-CM

## 2020-01-12 MED ORDER — SODIUM CHLORIDE 0.9 % IJ SOLN
50 mL | Freq: Once | INTRAVENOUS | 0 refills | Status: CP
Start: 2020-01-12 — End: ?
  Administered 2020-01-12: 14:00:00 50 mL via INTRAVENOUS

## 2020-01-12 MED ORDER — IOHEXOL 350 MG IODINE/ML IV SOLN
70 mL | Freq: Once | INTRAVENOUS | 0 refills | Status: CP
Start: 2020-01-12 — End: ?

## 2020-01-13 ENCOUNTER — Encounter: Admit: 2020-01-13 | Discharge: 2020-01-13 | Payer: BC Managed Care – PPO

## 2020-01-16 ENCOUNTER — Encounter: Admit: 2020-01-16 | Discharge: 2020-01-16 | Payer: BC Managed Care – PPO

## 2020-01-18 ENCOUNTER — Encounter: Admit: 2020-01-18 | Discharge: 2020-01-18 | Payer: BC Managed Care – PPO

## 2020-01-20 ENCOUNTER — Encounter: Admit: 2020-01-20 | Discharge: 2020-01-20 | Payer: BC Managed Care – PPO

## 2020-01-24 ENCOUNTER — Encounter: Admit: 2020-01-24 | Discharge: 2020-01-24 | Payer: BC Managed Care – PPO

## 2020-01-30 ENCOUNTER — Encounter: Admit: 2020-01-30 | Discharge: 2020-01-30 | Payer: BC Managed Care – PPO

## 2020-01-30 ENCOUNTER — Ambulatory Visit: Admit: 2020-01-30 | Discharge: 2020-01-30 | Payer: BC Managed Care – PPO

## 2020-01-30 DIAGNOSIS — R109 Unspecified abdominal pain: Secondary | ICD-10-CM

## 2020-01-30 DIAGNOSIS — N289 Disorder of kidney and ureter, unspecified: Secondary | ICD-10-CM

## 2020-01-31 ENCOUNTER — Encounter: Admit: 2020-01-31 | Discharge: 2020-01-31 | Payer: BC Managed Care – PPO

## 2020-01-31 ENCOUNTER — Ambulatory Visit: Admit: 2020-01-31 | Discharge: 2020-02-01 | Payer: BC Managed Care – PPO

## 2020-01-31 DIAGNOSIS — E782 Mixed hyperlipidemia: Secondary | ICD-10-CM

## 2020-01-31 DIAGNOSIS — M1990 Unspecified osteoarthritis, unspecified site: Secondary | ICD-10-CM

## 2020-01-31 DIAGNOSIS — K257 Chronic gastric ulcer without hemorrhage or perforation: Secondary | ICD-10-CM

## 2020-01-31 DIAGNOSIS — I10 Essential (primary) hypertension: Secondary | ICD-10-CM

## 2020-01-31 DIAGNOSIS — G4730 Sleep apnea, unspecified: Secondary | ICD-10-CM

## 2020-01-31 DIAGNOSIS — E785 Hyperlipidemia, unspecified: Secondary | ICD-10-CM

## 2020-01-31 DIAGNOSIS — M2560 Stiffness of unspecified joint, not elsewhere classified: Secondary | ICD-10-CM

## 2020-01-31 DIAGNOSIS — F5101 Primary insomnia: Secondary | ICD-10-CM

## 2020-01-31 DIAGNOSIS — F419 Anxiety disorder, unspecified: Secondary | ICD-10-CM

## 2020-01-31 NOTE — Patient Instructions
Get fasting labs few days before return to clinic in 3 months

## 2020-01-31 NOTE — Progress Notes
Date of Service: 01/31/2020    Jared Hebert is a 69 y.o. male. DOB: 01-15-1951   MRN#: [id number]    Subjective: follow up       Back Pain  This is a chronic problem. The current episode started more than 1 month ago. The problem occurs intermittently. The pain is at a severity of 3/10. The pain is moderate. The pain is worse during the day. The symptoms are aggravated by bending and standing. Pertinent negatives include no abdominal pain, chest pain, dysuria, headaches, numbness, weakness or weight loss. He has tried chiropractic manipulation and NSAIDs for the symptoms. The treatment provided no relief.   Hypertension  This is a chronic problem. The problem is controlled. Pertinent negatives include no anxiety, blurred vision, chest pain, headaches, malaise/fatigue, orthopnea, palpitations or shortness of breath.     Jared Hebert is 69 y.o. patient who presents to clinic for follow up. He established care 07/19/2019. He is a Engineer, civil (consulting) in [place]. No smoking, no alcohol, no street drugs. No regular exercise. He has 4 children.     He was last seen 11/2019    He is in clinic for low back pain for 6 weeks as above. 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   He was referred by cardiology to GI because of anemia. He did EGD and colonscopy 01/25/2020 and was found to have a non-bleeding gastric ulcer 15mm that was biopsied and is scheduled for repeat EGD in 2 months. Colonscopy was normal. Results of scopes are sent for scanning. I told him to stop all  NSAID and Mobic at this time    His calcium score was 315. He saw cardiology 12/2019. He had CTA chest which was normal but There was incidental cholelithiasis and a right renal lesion that will require ultrasound for further definition.     He had a normal stress test 11/09/2020  He failed oral iron so cardiology ordered IV Iron.     He had abdomen US 01/30/2020 which showed: Small hypoechoic exophytic right renal nodule most compatible with a   hemorrhagic cyst   Cholelithiasis   Mild bladder wall thickening, nonspecific but most likely secondary to   bladder outlet stenosis or neurogenic bladder     He managed to lose intentionally 55 Lb over last 1 year by having ketogenic diet. He takes multivitamin daily.    He also struggles with depression but no intent to harm himself or others. His brother had depression and committed suicide in 2003.       Medical History:   Diagnosis Date   ? Arthritis     back & shoulders   ? Hyperlipidemia    ? Hypertension    ? Joint stiffness 2009   ? Sleep apnea        Surgical History:   Procedure Laterality Date   ? VASECTOMY  1984   ? HX CATARACT REMOVAL Bilateral 02/2017    OS Toric   ? MENISCECTOMY Left 2019   ? PARS PLANA MECHANICAL VITRECTOMY WITH  Kenolog Right 09/02/2018    Per[facility] by Blair Hailey[facility] at [facility] [place]   ? PARS PLANA MECHANICAL VITRECTOMY Left 11/04/2018    Performed by Blair Hailey, MD at [facility] OR   ? HX ADENOIDECTOMY     ? HX TONSILLECTOMY     ? NASAL SURGERY      TWICE  family history includes Cataract in his father and mother; Depression in his brother; Heart Disease in his father.    Social History     Socioeconomic History   ? Marital status: Married     Spouse name: Not on file   ? Number of children: Not on file   ? Years of education: Not on file   ? Highest education level: Not on file   Occupational History   ? Not on file   Tobacco Use   ? Smoking status: Former Smoker     Start date: 08/04/1967     Quit date: 2008     Years since quitting: 13.1   ? Smokeless tobacco: Former Estate agent and Sexual Activity   ? Alcohol use: Not Currently     Frequency: Never     Comment: seldom    ? Drug use: No   ? Sexual activity: Not on file   Other Topics Concern   ? Not on file   Social History Narrative   ? Not on file       Social History     Tobacco Use   ? Smoking status: Former Smoker     Start date: 08/04/1967 Quit date: 2008     Years since quitting: 13.1   ? Smokeless tobacco: Former Estate agent Use Topics   ? Alcohol use: Not Currently     Frequency: Never     Comment: seldom         Review of Systems   Constitution: Negative for malaise/fatigue, night sweats, weight gain and weight loss.   HENT: Negative for sore throat and stridor.    Eyes: Negative for blurred vision.   Cardiovascular: Negative for chest pain, orthopnea, palpitations and syncope.   Respiratory: Negative for cough, shortness of breath, snoring and wheezing.    Skin: Negative for rash.   Musculoskeletal: Positive for back pain. Negative for joint pain.   Gastrointestinal: Negative for abdominal pain, diarrhea, nausea and vomiting.   Genitourinary: Negative for dysuria, flank pain and hesitancy.   Neurological: Negative for disturbances in coordination, excessive daytime sleepiness, dizziness, focal weakness, headaches, numbness, seizures and weakness.   Psychiatric/Behavioral: Negative for depression, hallucinations and memory loss. The patient is not nervous/anxious.    Allergic/Immunologic: Negative for hives.   All other systems reviewed and are negative.      Objective:     ? ascorbic acid (VITAMIN C) 500 mg tablet Take 500 mg by mouth daily.   ? aspirin EC 81 mg tablet Take 81 mg by mouth daily. Take with food.   ? cetirizine (ZYRTEC) 10 mg tablet Take 10 mg by mouth.   ? citalopram (CELEXA) 40 mg tablet Take one tablet by mouth daily.   ? cyclobenzaprine (FLEXERIL) 5 mg tablet Take 1-2 tablets by mouth daily as needed.   ? famotidine (PEPCID) 20 mg tablet Take 20 mg by mouth twice daily.   ? fluticasone propionate (FLONASE) 50 mcg/actuation nasal spray, suspension Apply 1 spray to each nostril as directed daily. Shake bottle gently before using.   ? folic acid (FOLVITE) 1 mg tablet Take one tablet by mouth daily.   ? glucosamine(+) 500 mg tab Take 1,500 mg by mouth once. ? lifitegrast (XIIDRA) 5 % ophthalmic drop Apply one drop to both eyes twice daily.   ? losartan (COZAAR) 25 mg tablet Take one tablet by mouth daily. Indications: pt unsure of doseage   ? meloxicam (MOBIC) 15 mg tablet  Take one tablet by mouth daily.   ? rosuvastatin (CRESTOR) 10 mg tablet Take one tablet by mouth daily.   ? zolpidem (AMBIEN) 5 mg tablet Take one tablet by mouth at bedtime as needed for Sleep.     Vitals:    01/31/20 1110   BP: 122/84   BP Source: Arm, Right Upper   Patient Position: Sitting   Pulse: 71   Resp: 16   Temp: 36.2 ?C (97.2 ?F)   TempSrc: Temporal   SpO2: 98%   Weight: 78.9 kg (174 lb)   Height: 175.3 cm (69.02)   PainSc: Three     Body mass index is 25.68 kg/m?Marland Kitchen     Physical Exam  Vitals signs and nursing note reviewed.   Constitutional:       General: He is not in acute distress.     Appearance: He is well-developed. He is not diaphoretic.   HENT:      Head: Normocephalic and atraumatic.      Nose: Nose normal.   Eyes:      General: No scleral icterus.        Right eye: No discharge.         Left eye: No discharge.      Conjunctiva/sclera: Conjunctivae normal.      Pupils: Pupils are equal, round, and reactive to light.   Neck:      Musculoskeletal: Normal range of motion and neck supple.      Thyroid: No thyromegaly.      Vascular: No JVD.      Trachea: No tracheal deviation.   Cardiovascular:      Rate and Rhythm: Normal rate and regular rhythm.      Heart sounds: Normal heart sounds. No murmur. No friction rub.   Pulmonary:      Effort: Pulmonary effort is normal. No respiratory distress.      Breath sounds: Normal breath sounds. No wheezing or rales.   Chest:      Chest wall: No tenderness.   Abdominal:      General: There is no distension.      Palpations: Abdomen is soft. There is no mass.      Tenderness: There is no abdominal tenderness. There is no guarding or rebound.   Musculoskeletal: Normal range of motion.   Lymphadenopathy:      Cervical: No cervical adenopathy. Skin:     General: Skin is warm and dry.   Neurological:      Mental Status: He is alert and oriented to person, place, and time.      Cranial Nerves: No cranial nerve deficit.   Psychiatric:         Behavior: Behavior normal.          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   He was referred by cardiology to GI because of anemia. He did EGD and colonscopy 01/25/2020 and was found to have a non-bleeding gastric  ulcer 15mm that was biopsied and is scheduled for repeat EGD in 2 months. Colonscopy was normal. Results of scopes are sent for scanning. I told him to stop all  NSAID and Mobic at this time  He failed oral iron so cardiology ordered IV Iron.     Hypertension:  He is on HCTZ and Losarten  BP Readings from Last 3 Encounters:   01/31/20 122/84   12/27/19 (!) 110/92   12/20/19 131/87   continue  He had stress test and echo in 02/2016 that were reported as normal and I reviewed in care Every where  We ordered CT calcium score which he did 08/2019    Coronary calcification:  CT calcium scan 08/2019:  Quantitative Coronary Calcium Score:   Left main: 0   LAD: 114   LCX: 144   RCA: 5 7   Total calcium score: 315   He saw cardiology and had echo and normal stress test 10/2019 and has a follow up 12/2019    Renal mass:  His calcium score was 315. He saw cardiology 12/2019. He had CTA chest which was normal but There was incidental cholelithiasis and a right renal lesion that will require ultrasound for further definition.     He had a normal stress test 11/09/2020  He had abdomen US 01/30/2020 which showed: Small hypoechoic exophytic right renal nodule most compatible with a   hemorrhagic cyst   Cholelithiasis   Mild bladder wall thickening, nonspecific but most likely secondary to   bladder outlet stenosis or neurogenic bladder   Stable    Hyperlipidemia:  He is on Zocor 20mg  daily but did not take for 30 days. He had cramps with Lipitor.  We increased Zocor to 40mg  daily 07/19/2019  Cardiology switched him to Crestor 10mg  daily  Plan on repeat labs in 3 months  Lab Results   Component Value Date    CHOL 233 (H) 12/27/2019    TRIG 499 (H) 12/27/2019    HDL 43 12/27/2019    LDL 159 (H) 12/27/2019    VLDL 100 12/27/2019    NONHDLCHOL 190 12/27/2019      Anxiety and Depression:   He had for 20 years  He is on Effexor and Remeron  He is not feeling that this combination is helping  He is homicidal or suicidal but his brother committed suicide in 2013  We got him to taper off his medications over2 weeks and we he started Celexa 20mg  daily after he tapered off  We referred to psychologist  I am referring this patient to the behavior health consultant for the following conditions:  Health Risk Behaviors:  Stress management  Mental Health Conditions:  Depression and Anxiety  We increased Celexa from 20 to 40mg  daily back in 08/2019  He is doing better  Reassess in 2 months    History of sleep apnea:  He is not snoring after he lost weight    Weakness:  Normal labs and TSH and B12 level   He is on ketogenic diet    Hearing deficits:  He has hearing aids    Routine health maintenance:    Colonoscopy: 2014, normal  Tdap: 2015  Pneumovax: Prevnar 07/19/2019 and plan on pneumovax in a year  Influenza:09/2018, 10/2019  Eye exam: 2019  Dental exam: 2019  AAA screening: 07/2019 Total of 40 minutes were spent on the same day of the visit including preparing to see the patient, obtaining and/or reviewing separately obtained history, performing a  medically appropriate examination and/or evaluation, counseling and educating the patient/family/caregiver, ordering medications, tests, or procedures, referring and communication with other health care professionals, documenting clinical information in the electronic or other health record, independently interpreting results and communicating results to the patient/family/caregiver, and care coordination.   I Counseled patient regarding low back pain, depression and weakness. We discussed safety during COVID19 pandemic. We discussed gastric ulcer.     Orders Placed This Encounter   ? AMB REFERRAL TO PHYSICAL MEDICINE REHAB     No orders of the defined types were placed in this encounter.    Future Appointments   Date Time Provider Department Center   02/06/2020  8:30 AM Danielle Rankin, PT [facility]   02/15/2020  7:15 AM [id number] None   02/20/2020  4:00 PM Al-Hihi, Roderic Scarce, MD [facility]     Patient Instructions   Get fasting labs few days before return to clinic in 3 months

## 2020-02-01 DIAGNOSIS — G8929 Other chronic pain: Secondary | ICD-10-CM

## 2020-02-01 DIAGNOSIS — M545 Low back pain: Secondary | ICD-10-CM

## 2020-02-06 ENCOUNTER — Encounter: Admit: 2020-02-06 | Discharge: 2020-02-06 | Payer: BC Managed Care – PPO

## 2020-02-07 ENCOUNTER — Encounter: Admit: 2020-02-07 | Discharge: 2020-02-07 | Payer: BC Managed Care – PPO

## 2020-02-10 ENCOUNTER — Encounter: Admit: 2020-02-10 | Discharge: 2020-02-10 | Payer: BC Managed Care – PPO

## 2020-02-10 NOTE — Telephone Encounter
Contacted patient about appointment scheduled with Dr. Curly Rim on Monday 02/13/20 @ 8:30 am  Patient confirmed appointment-yes  Appointment was confirmed in the chart-yes      New Patient packet has been done.

## 2020-02-13 ENCOUNTER — Ambulatory Visit: Admit: 2020-02-13 | Discharge: 2020-02-13 | Payer: BC Managed Care – PPO

## 2020-02-13 ENCOUNTER — Encounter: Admit: 2020-02-13 | Discharge: 2020-02-13 | Payer: BC Managed Care – PPO

## 2020-02-13 DIAGNOSIS — M2560 Stiffness of unspecified joint, not elsewhere classified: Secondary | ICD-10-CM

## 2020-02-13 DIAGNOSIS — I10 Essential (primary) hypertension: Secondary | ICD-10-CM

## 2020-02-13 DIAGNOSIS — F329 Major depressive disorder, single episode, unspecified: Secondary | ICD-10-CM

## 2020-02-13 DIAGNOSIS — M545 Low back pain: Secondary | ICD-10-CM

## 2020-02-13 DIAGNOSIS — M47816 Spondylosis without myelopathy or radiculopathy, lumbar region: Secondary | ICD-10-CM

## 2020-02-13 DIAGNOSIS — G4730 Sleep apnea, unspecified: Secondary | ICD-10-CM

## 2020-02-13 DIAGNOSIS — M1990 Unspecified osteoarthritis, unspecified site: Secondary | ICD-10-CM

## 2020-02-13 DIAGNOSIS — M2550 Pain in unspecified joint: Secondary | ICD-10-CM

## 2020-02-13 DIAGNOSIS — H269 Unspecified cataract: Secondary | ICD-10-CM

## 2020-02-13 DIAGNOSIS — E785 Hyperlipidemia, unspecified: Secondary | ICD-10-CM

## 2020-02-13 DIAGNOSIS — IMO0002 Ulcer: Secondary | ICD-10-CM

## 2020-02-13 DIAGNOSIS — R519 Generalized headaches: Secondary | ICD-10-CM

## 2020-02-13 MED ORDER — CELECOXIB 50 MG PO CAP
50 mg | ORAL_CAPSULE | Freq: Two times a day (BID) | ORAL | 3 refills | Status: DC | PRN
Start: 2020-02-13 — End: 2020-02-14

## 2020-02-13 NOTE — Progress Notes
SPINE CENTER HISTORY AND PHYSICAL    Chief Complaint   Patient presents with   ? Back Pain     LOW AND MIDDLE BACK PAIN, GETTING WORSE OVER THE LAST 6  MONTHS.             HISTORY OF PRESENT ILLNESS:  Mr. Jared Hebert is a 69 year old male past medical history of hypertension, hyperlipidemia, anxiety and depression, gastric ulcer, obesity, and OSA who presents today for evaluation of chronic low back pain.  Patient reports his pain began 6 to 12 months ago and is located in his lower back.  There is no inciting event or trauma.  Pain is intermittent and he characterizes as aching, sharp.  Alleviating factors include sitting down and frequently changing positions.  Aggravating factors include standing, laying flat, sitting for extended period of time.  He denies any radiation into his legs or weakness.  He denies any loss of bowel or bladder function.  Pain is 4-5/10 on VAS pain scale.  He has not had any previous work-up.      Previous workup:  None    Previous treatment:  - 4 weeks of physical therapy with 0% relief of his pain  - chiropractor a few times without any relief of his pain.      Previous medications:  - meloxicam and ibuprofen with some relief of his pain (but developed gastric ulcer, so no longer taking).  - Flexeril with some relief - takes currently qhs prn  - Norco with some relief  - Tylenol with some relief - takes currently.         Medical History:   Diagnosis Date   ? Arthritis     back & shoulders   ? Cataract 5 Years ago   ? Depression 20+ Yrs   ? Generalized headaches Most of adult life   ? Hyperlipidemia    ? Hypertension    ? Joint pain 10 yrs ago   ? Joint stiffness 2009   ? Sleep apnea    ? Ulcer 12/2019       Surgical History:   Procedure Laterality Date   ? VASECTOMY  1984   ? HX CATARACT REMOVAL Bilateral 02/2017    OS Toric   ? MENISCECTOMY Left 2019   ? PARS PLANA MECHANICAL VITRECTOMY WITH  Kenolog Right 09/02/2018    Performed by Blair Hailey, MD at [facility]   ? PARS PLANA MECHANICAL VITRECTOMY Left 11/04/2018    Performed by [facility] at [facility]   ? HX ADENOIDECTOMY     ? HX ARTHROSCOPIC SURGERY  4 yrs ago    Left Knee Scope   ? HX TONSILLECTOMY     ? KNEE SURGERY  Left Knee Scope 4 yrs ago   ? NASAL SURGERY      TWICE       family history includes Arthritis in his father and mother; Back pain in his brother, father, and mother; Cataract in his father and mother; Depression in his brother; Heart Disease in his father; Heart problem in his father; Hypertension in his father; Joint Pain in his brother, father, and mother; Osteoporosis in his mother.    Social History     Socioeconomic History   ? Marital status: Married     Spouse name: Not on file   ? Number of children: Not on file   ? Years of education: Not on file   ? Highest education level: Not on file  Occupational History   ? Not on file   Tobacco Use   ? Smoking status: Former Smoker     Packs/day: 1.00     Years: 10.00     Pack years: 10.00     Types: Cigarettes     Start date: 08/04/1967     Quit date: 2008     Years since quitting: 13.1   ? Smokeless tobacco: Former Neurosurgeon   ? Tobacco comment: 15 + yrs since last smoked   Substance and Sexual Activity   ? Alcohol use: Not Currently     Alcohol/week: 2.0 standard drinks     Types: 2 Cans of beer per week     Frequency: Never     Comment: Social drinker   ? Drug use: Never   ? Sexual activity: Yes     Partners: Female     Birth control/protection: None   Other Topics Concern   ? Not on file   Social History Narrative   ? Not on file       Allergies   Allergen Reactions   ? Seasonal Allergies SNEEZING         Current Outpatient Medications:   ?  ascorbic acid (VITAMIN C) 500 mg tablet, Take 500 mg by mouth daily., Disp: , Rfl:   ?  aspirin EC 81 mg tablet, Take 81 mg by mouth daily. Take with food., Disp: , Rfl:   ?  cetirizine (ZYRTEC) 10 mg tablet, Take 10 mg by mouth., Disp: , Rfl:   ?  citalopram (CELEXA) 40 mg tablet, Take one tablet by mouth daily., Disp: 90 tablet, Rfl: 3 ?  cyclobenzaprine (FLEXERIL) 5 mg tablet, Take 1-2 tablets by mouth daily as needed., Disp: , Rfl:   ?  famotidine (PEPCID) 20 mg tablet, Take 20 mg by mouth twice daily., Disp: , Rfl:   ?  fluticasone propionate (FLONASE) 50 mcg/actuation nasal spray, suspension, Apply 1 spray to each nostril as directed daily. Shake bottle gently before using., Disp: , Rfl:   ?  folic acid (FOLVITE) 1 mg tablet, Take one tablet by mouth daily., Disp: 90 tablet, Rfl: 1  ?  glucosamine(+) 500 mg tab, Take 1,500 mg by mouth once., Disp: , Rfl:   ?  lifitegrast (XIIDRA) 5 % ophthalmic drop, Apply one drop to both eyes twice daily., Disp: 60 each, Rfl: 11  ?  losartan (COZAAR) 25 mg tablet, Take one tablet by mouth daily. Indications: pt unsure of doseage, Disp: 90 tablet, Rfl: 3  ?  meloxicam (MOBIC) 15 mg tablet, Take one tablet by mouth daily., Disp: 90 tablet, Rfl: 3  ?  rosuvastatin (CRESTOR) 10 mg tablet, Take one tablet by mouth daily., Disp: 90 tablet, Rfl: 3  ?  zolpidem (AMBIEN) 5 mg tablet, Take one tablet by mouth at bedtime as needed for Sleep., Disp: 30 tablet, Rfl: 0    Vitals:    02/13/20 0828   BP: 129/84   BP Source: Arm, Right Upper   Patient Position: Sitting   Pulse: 88   Resp: 20   Temp: 36.4 ?C (97.6 ?F)   TempSrc: Oral   SpO2: 95%   Weight: 78.9 kg (174 lb)   Height: 175.3 cm (69)   PainSc: Three       Oswestry Total Score:: 24    Male Opioid Risk Score: 1 (02/13/2020  8:30 AM)  Opioid Risk Category: Low Risk (02/13/2020  8:30 AM)    Is a controlled substance agreement on  file?No    Pain Score: Three    Body mass index is 25.7 kg/m?Marland Kitchen    Review of Systems   Constitutional: Positive for fatigue.   HENT: Positive for hearing loss and tinnitus.    Eyes: Negative.    Respiratory: Negative.    Cardiovascular: Negative.    Gastrointestinal: Negative.    Endocrine: Negative.    Genitourinary: Negative.    Musculoskeletal: Positive for arthralgias, back pain, neck pain and neck stiffness.   Skin: Negative. Allergic/Immunologic: Negative.    Neurological: Positive for headaches.   Hematological: Negative.    Psychiatric/Behavioral: Negative.    All other systems reviewed and are negative.         PHYSICAL EXAM:    Constitutional: no acute distress, appears stated age  ENMT: supple  Cardiovascular: Well perfused  Respiratory: normal effort with respirations  Gastrointestinal: nondistended  Extremities: No edema  Skin: Warm and dry  Psychiatric:  Appropriate mood and affect    Neuro:  MS:   Root Right Left   Hip Flexion L2 5 5   Knee Extension L3 5 5   Dorsiflexion L4 5 5   Plantarflexion S1 5 5   EHL Extension L5 5 5     Sensation: intact to light touch all BLE dermatomes.  Reflexes:  symmetric patella and Achilles  Negative Clonus bilaterally    MSK:  Inspection - no atrophy or swelling  Palpation - No tenderness to palpation along the spinous process, facet joints, paraspinal musculature, SI joints, gluteal musculature, greater trochanters.    ROM:  Full ROM with flexion and extension.    Slump test Negative  Facet Loading positive bilaterally    FADIR Negative  FABER Negative      RADIOGRAPHIC EVALUATION:  no images to review today.      IMPRESSION:    1. Chronic bilateral low back pain without sciatica    2. Lumbar spondylosis      Patient is a 69 year old male who presents today for evaluation of chronic axial low back pain.  History and physical examination are consistent with a diagnosis of lumbar facet joint pain in the setting of facet arthropathy.  Less likely would be lumbar spinal stenosis.      PLAN:   1. Lifestyle Modifications:  Continue activity as tolerated.    2. Medications:  Continue tylenol scheduled and flexeril qhs prn.  Consider Celebrex 50mg  BID prn in setting of gastric ulcer.  3. Therapy:  Continue current home exercise program.  4. Diagnostics:  L-spine x-rays and MRI L-spine ordered today.  5. Interventions:  Will get patient scheduled for bilateral L3, L4, and L5 MBBs.    6. Follow up: Patient will follow up for procedure.  If MRI L-spine shows lumbar stenosis instead of facet arthropathy, we will call patient and cancel MBBs and lean more towards doing an epidural.    Patient was seen and discussed with Dr. Katrinka Hebert.    Jared Hebert, M.D.  Interventional Musculoskeletal and Spine Fellow  Physical Medicine and Rehabilitation     ATTESTATION    I personally performed the key portions of the E/M visit, discussed case with fellow and concur with fellow documentation of history, physical exam, assessment, and treatment plan unless otherwise noted.    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  Patient reports pain is been present 6 to 12 months.  Pain is located in low back.  He has not been diagnosed.  Is not work-related.  Denies specific injury.  Temporally, the pain is intermittent.  Quality the pain is aching, sharp.  Pain is better with sitting down, changing positions, and light movement.  Medications also provides benefit.  Pain is worse with standing.  Pain is worse with lying supine, and standing slightly bent in the surgical field.  He can sit, stand, walk 30 minutes an hour before pain occurs.  He denies radiation.  Denies weakness.  Pain is worse at night.  He denies unintentional weight loss.  VAS pain scores are as a 4-5/10.  He has not had imaging.  He has had physical therapy for 6 weeks without relief.  He has had chiropractic care on 3 occasions without relief.  He has been on ibuprofen meloxicam, provide some relief, but he developed a gastric ulcer, so it has discontinued.  He has been on cyclobenzaprine with some relief.  He has been on hydrocodone with some relief.    On examination he demonstrates tenderness at L4-L5 L5-S1 facet joints.  Facet loading is grossly positive.  He is neurologically intact.  No upper motor neuron signs.    Radiographs of lumbar spine from 02/13/2020 was personally reviewed and demonstrated multilevel lumbar spondylosis, worse at L3-L4 and L4-L5.  There is grade 1 anterolisthesis of L4 on L5 and L5 on S1.  No instability with flexion or extension.    Mr. Jared Hebert is a 69 year old male with history of gastric ulcer who presents with axial low back pain secondary to lumbar spondylosis in the setting of degenerative disc disease.  Radiographs of lumbar spine were ordered as noted above.  I would recommend MRI of lumbar spine.  If no evidence of moderate to severe central stenosis, I recommend bilateral L3, L4, L5 medial branch blocks.  If there is evidence of moderate to severe lumbar stenosis, I would recommend epidural.  I would like him to continue with acetaminophen.  In instance of severe pain, he may benefit from Celebrex 50 mg.  I have asked him to discuss regular anti-inflammatory use with his primary care provider.  We will see him back after imaging    Thank you Dr. Enis Slipper for the opportunity to take part in the care of this very pleasant patient.  Please feel free to call with questions or concerns.     Staff name: Jared Rim, MD  Date: 02/13/20

## 2020-02-15 ENCOUNTER — Encounter: Admit: 2020-02-15 | Discharge: 2020-02-15 | Payer: BC Managed Care – PPO

## 2020-02-15 ENCOUNTER — Ambulatory Visit: Admit: 2020-02-15 | Discharge: 2020-02-16 | Payer: BC Managed Care – PPO

## 2020-02-15 DIAGNOSIS — M2550 Pain in unspecified joint: Secondary | ICD-10-CM

## 2020-02-15 DIAGNOSIS — M2560 Stiffness of unspecified joint, not elsewhere classified: Secondary | ICD-10-CM

## 2020-02-15 DIAGNOSIS — I10 Essential (primary) hypertension: Secondary | ICD-10-CM

## 2020-02-15 DIAGNOSIS — R519 Generalized headaches: Secondary | ICD-10-CM

## 2020-02-15 DIAGNOSIS — IMO0002 Ulcer: Secondary | ICD-10-CM

## 2020-02-15 DIAGNOSIS — E785 Hyperlipidemia, unspecified: Secondary | ICD-10-CM

## 2020-02-15 DIAGNOSIS — M1990 Unspecified osteoarthritis, unspecified site: Secondary | ICD-10-CM

## 2020-02-15 DIAGNOSIS — G4730 Sleep apnea, unspecified: Secondary | ICD-10-CM

## 2020-02-15 DIAGNOSIS — H269 Unspecified cataract: Secondary | ICD-10-CM

## 2020-02-15 DIAGNOSIS — D509 Iron deficiency anemia, unspecified: Secondary | ICD-10-CM

## 2020-02-15 DIAGNOSIS — F329 Major depressive disorder, single episode, unspecified: Secondary | ICD-10-CM

## 2020-02-15 MED ORDER — IRON DEXTRAN IVPB
25 mg | Freq: Once | INTRAVENOUS | 0 refills | Status: CN
Start: 2020-02-15 — End: ?

## 2020-02-15 MED ORDER — IRON DEXTRAN IVPB
25 mg | Freq: Once | INTRAVENOUS | 0 refills | Status: CP
Start: 2020-02-15 — End: ?
  Administered 2020-02-15 (×2): 25 mg via INTRAVENOUS

## 2020-02-15 MED ORDER — ACETAMINOPHEN 500 MG PO TAB
500 mg | Freq: Once | ORAL | 0 refills | Status: CN
Start: 2020-02-15 — End: ?

## 2020-02-15 MED ORDER — ACETAMINOPHEN 500 MG PO TAB
500 mg | Freq: Once | ORAL | 0 refills | Status: CP
Start: 2020-02-15 — End: ?
  Administered 2020-02-15: 15:00:00 500 mg via ORAL

## 2020-02-15 MED ORDER — DIPHENHYDRAMINE HCL 25 MG PO CAP
25 mg | Freq: Once | ORAL | 0 refills | Status: CN
Start: 2020-02-15 — End: ?

## 2020-02-15 MED ORDER — DIPHENHYDRAMINE HCL 25 MG PO CAP
25 mg | Freq: Once | ORAL | 0 refills | Status: CP
Start: 2020-02-15 — End: ?
  Administered 2020-02-15: 15:00:00 25 mg via ORAL

## 2020-02-15 MED ORDER — IRON DEXTRAN IVPB
975 mg | Freq: Once | INTRAVENOUS | 0 refills | Status: CN
Start: 2020-02-15 — End: ?

## 2020-02-15 MED ORDER — IRON DEXTRAN IVPB
975 mg | Freq: Once | INTRAVENOUS | 0 refills | Status: CP
Start: 2020-02-15 — End: ?
  Administered 2020-02-15 (×2): 975 mg via INTRAVENOUS

## 2020-02-20 ENCOUNTER — Encounter: Admit: 2020-02-20 | Discharge: 2020-02-20 | Payer: BC Managed Care – PPO

## 2020-02-21 ENCOUNTER — Encounter: Admit: 2020-02-21 | Discharge: 2020-02-21 | Payer: BC Managed Care – PPO

## 2020-02-21 ENCOUNTER — Ambulatory Visit: Admit: 2020-02-21 | Discharge: 2020-02-21 | Payer: BC Managed Care – PPO

## 2020-02-21 DIAGNOSIS — M47816 Spondylosis without myelopathy or radiculopathy, lumbar region: Secondary | ICD-10-CM

## 2020-02-21 DIAGNOSIS — M545 Low back pain: Secondary | ICD-10-CM

## 2020-02-23 ENCOUNTER — Encounter: Admit: 2020-02-23 | Discharge: 2020-02-23 | Payer: BC Managed Care – PPO

## 2020-02-27 ENCOUNTER — Encounter: Admit: 2020-02-27 | Discharge: 2020-02-27 | Payer: BC Managed Care – PPO

## 2020-03-01 ENCOUNTER — Encounter: Admit: 2020-03-01 | Discharge: 2020-03-01 | Payer: BC Managed Care – PPO

## 2020-03-01 MED ORDER — ZOLPIDEM 5 MG PO TAB
5 mg | ORAL_TABLET | Freq: Every evening | ORAL | 3 refills | 30.00000 days | Status: DC | PRN
Start: 2020-03-01 — End: 2020-04-30

## 2020-03-01 NOTE — Telephone Encounter
Refill Request received.    Patient last seen 01/31/20.    Follow up appt scheduled for 04/30/20.    Med not on protocol.  Routing to Dr. Enis Slipper for approval/denial.

## 2020-03-09 ENCOUNTER — Encounter: Admit: 2020-03-09 | Discharge: 2020-03-09 | Payer: BC Managed Care – PPO

## 2020-03-12 ENCOUNTER — Encounter: Admit: 2020-03-12 | Discharge: 2020-03-12 | Payer: BC Managed Care – PPO

## 2020-03-12 ENCOUNTER — Ambulatory Visit: Admit: 2020-03-12 | Discharge: 2020-03-12 | Payer: BC Managed Care – PPO

## 2020-03-12 DIAGNOSIS — R519 Generalized headaches: Secondary | ICD-10-CM

## 2020-03-12 DIAGNOSIS — G4730 Sleep apnea, unspecified: Secondary | ICD-10-CM

## 2020-03-12 DIAGNOSIS — M47816 Spondylosis without myelopathy or radiculopathy, lumbar region: Secondary | ICD-10-CM

## 2020-03-12 DIAGNOSIS — M1990 Unspecified osteoarthritis, unspecified site: Secondary | ICD-10-CM

## 2020-03-12 DIAGNOSIS — F329 Major depressive disorder, single episode, unspecified: Secondary | ICD-10-CM

## 2020-03-12 DIAGNOSIS — M2560 Stiffness of unspecified joint, not elsewhere classified: Secondary | ICD-10-CM

## 2020-03-12 DIAGNOSIS — IMO0002 Ulcer: Secondary | ICD-10-CM

## 2020-03-12 DIAGNOSIS — M2550 Pain in unspecified joint: Secondary | ICD-10-CM

## 2020-03-12 DIAGNOSIS — M545 Low back pain: Secondary | ICD-10-CM

## 2020-03-12 DIAGNOSIS — I10 Essential (primary) hypertension: Secondary | ICD-10-CM

## 2020-03-12 DIAGNOSIS — H269 Unspecified cataract: Secondary | ICD-10-CM

## 2020-03-12 DIAGNOSIS — E785 Hyperlipidemia, unspecified: Secondary | ICD-10-CM

## 2020-03-12 MED ORDER — BUPIVACAINE (PF) 0.5 % (5 MG/ML) IJ SOLN
3 mL | Freq: Once | INTRAMUSCULAR | 0 refills | Status: CP
Start: 2020-03-12 — End: ?

## 2020-03-12 MED ORDER — LIDOCAINE (PF) 10 MG/ML (1 %) IJ SOLN
5 mL | Freq: Once | INTRAMUSCULAR | 0 refills | Status: CP
Start: 2020-03-12 — End: ?

## 2020-03-12 NOTE — Progress Notes
SPINE CENTER  INTERVENTIONAL PAIN PROCEDURE HISTORY AND PHYSICAL    Chief Complaint   Patient presents with   ? Procedure       HISTORY OF PRESENT ILLNESS:  Jared Hebert is a 69 y.o. year old male who presents for injection.  Denies fevers, chills, or recent hospitalizations.  No allergies to contrast, latex, seafood, iodine, or shellfish.  Patient denies blood thinning medications.        Medical History:   Diagnosis Date   ? Arthritis     back & shoulders   ? Cataract 5 Years ago   ? Depression 20+ Yrs   ? Generalized headaches Most of adult life   ? Hyperlipidemia    ? Hypertension    ? Joint pain 10 yrs ago   ? Joint stiffness 2009   ? Sleep apnea    ? Ulcer 12/2019       Surgical History:   Procedure Laterality Date   ? VASECTOMY  1984   ? HX CATARACT REMOVAL Bilateral 02/2017    [facility]   ? MENISCECTOMY Left 2019   ? PARS PLANA MECHANICAL VITRECTOMY WITH  Kenolog Right 09/02/2018    Performed by Blair [facility]iley, MD at [facility] OR   ? PARS PLANA MECHANICAL VITRECTOMY Left 11/04/2018    Performed by [facility], [facility] at [facility] OR   ? HX ADENOIDECTOMY     ? HX ARTHROSCOPIC SURGERY  4 yrs ago    Left Knee Scope   ? HX TONSILLECTOMY     ? KNEE SURGERY  Left Knee Scope 4 yrs ago   ? NASAL SURGERY      TWICE       family history includes Arthritis in his father and mother; Back pain in his brother, father, and mother; Cataract in his father and mother; Depression in his brother; Heart Disease in his father; Heart problem in his father; Hypertension in his father; Joint Pain in his brother, father, and mother; Osteoporosis in his mother.    Social History     Socioeconomic History   ? Marital status: Married     Spouse name: Not on file   ? Number of children: Not on file   ? Years of education: Not on file   ? Highest education level: Not on file   Occupational History   ? Not on file   Tobacco Use   ? Smoking status: Former Smoker     Packs/day: 1.00     Years: 10.00     Pack years: 10.00     Types: Cigarettes Start date: 08/04/1967     Quit date: 2008     Years since quitting: 13.2   ? Smokeless tobacco: Former Neurosurgeon   ? Tobacco comment: 15 + yrs since last smoked   Substance and Sexual Activity   ? Alcohol use: Not Currently     Alcohol/week: 2.0 standard drinks     Types: 2 Cans of beer per week     Frequency: Never     Comment: Social drinker   ? Drug use: Never   ? Sexual activity: Yes     Partners: Female     Birth control/protection: None   Other Topics Concern   ? Not on file   Social History Narrative   ? Not on file       Allergies   Allergen Reactions   ? Seasonal Allergies SNEEZING       Vitals:  03/12/20 1344   BP: 125/82   BP Source: Arm, Right Upper   Pulse: 71   Temp: 36.4 ?C (97.5 ?F)   SpO2: 98%   Weight: 78.9 kg (174 lb)   Height: 172.7 cm (68)       REVIEW OF SYSTEMS: 10 point ROS obtained and negative except for back pain      PHYSICAL EXAM:  General: 69 y.o. male appears stated age, in no acute distress  HEENT: Normocephalic, atraumatic  Neck: No thyroidmegaly  Cardiovascular: Well perfused  Pulmonary: Unlabored respirations  Extremities: No cyanosis, clubbing, or edema  Skin: No lesions seen on exposed skin  Psychiatric:  Appropriate mood and affect  Musculoskeletal: No atrophy.   Neurologic: Antigravity strength in all extremities. CN II -XII grossly intact.  Alert and oriented x 3.         IMPRESSION:    1. Chronic bilateral low back pain without sciatica    2. Lumbar spondylosis         PLAN:   Bilateral L3, L4, L5 MBB, #1/2

## 2020-03-12 NOTE — Discharge Instructions - Supplementary Instructions
Discharge Instructions for Medial Branch Block    Important information following your procedure today:     This injection is for diagnostic purposes, it is a test. Only short term results are expected.    1. Though the procedure is generally safe and complications are rare, we do ask that you be aware of any of the following:   ? Any swelling, persistent redness, new bleeding, or drainage from the site of the injection.  ? You should not experience a severe headache.  ? You should not run a fever over 101? F.  ? New onset of sharp, severe back & or neck pain.  ? New onset of upper or lower extremity numbness or weakness.  ? New difficulty controlling bowel or bladder function after the injection.  ? New shortness of breath.    If any of these occur, please call to report this occurrence to a nurse. If you are calling after 4:00 p.m. or on weekends or holidays please call [phone] and ask to have the resident physician on call for the physician paged or go to your local emergency room.  2. You may experience soreness at the injection site. Ice can be applied at 20 minute intervals. Avoid application of direct heat, hot showers or hot tubs today.  3. Patients taking a daily blood thinner can resume their regular dose this evening.  4. It is important that you take all medications ordered by your pain physician. Taking medication as ordered is an important part of your pain care plan. If you cannot continue the medication plan, please notify the physician.   5. Remain active today. Do the activities that would normally cause you pain.  6. It is important for you to keep track of the results of this test on paper.  ? Did you get relief?  ? Percentage of relief?  ? How long did it last? Call back to report the results to a nurse. Use notes that you kept when giving your report. You may have to leave a message and a nurse will contact you to help you determine if you are a candidate for the Radiofrequency Ablation Procedure.    PAIN DIARY  Please report pain on 0-10 scale for each hour listed following discharge.  (0 = No pain; 5 = Moderate pain; 10 = Worst pain of your life)  TIME DAY 1 DAY 2   12AM MIDNIGHT     1AM       2AM       3AM       4AM        5AM       6AM       7AM       8AM       9AM       10AM       11AM       12PM NOON     1PM       2PM       3PM       4PM       5PM       6PM        7PM       8PM       9PM       10PM       11PM            Dr. Michaelle Copas nurse:    [phone]  If you are unable to keep your upcoming appointment, please notify the [facility]ine Center scheduler at [phone] at least 24 hours in advance. If you have questions for the surgery center, call [facility] at [phone].

## 2020-03-12 NOTE — Procedures
Attending Surgeon: Curly Rim, MD    Anesthesia: Local    Pre-Procedure Diagnosis:   1. Lumbar spondylosis    2. Chronic bilateral low back pain without sciatica        Post-Procedure Diagnosis:   1. Lumbar spondylosis    2. Chronic bilateral low back pain without sciatica        [place] [facility] INJECT PVRT FACET MBB JT LMBR/SAC  Procedure: medial branch block    Laterality: bilateral    Location: lumbar - L4-5 and L5-S1      Consent:   Consent obtained: verbal and written  Consent given by: patient  Risks discussed: allergic reaction, bruising, infection, nerve damage, no change or worsening in pain, reaction to medication, seizure and weakness    Discussed with patient the purpose of the treatment/procedure, other ways of treating my condition, including no treatment/ procedure and the risks and benefits of the alternatives. Patient has decided to proceed with treatment/procedure.        Universal Protocol:  Relevant documents: relevant documents present and verified  Imaging studies: imaging studies available  Site marked: the operative site was marked  Patient identity confirmed: Patient identify confirmed verbally with patient.        Time out: Immediately prior to procedure a time out was called to verify the correct patient, procedure, equipment, support staff and site/side marked as required      Procedures Details:   Indications: diagnostic evaluation   Prep: chlorhexidine  Patient position: prone  Estimated Blood Loss: minimal  Specimens: none  Number of Levels: 2  Guidance: fluoroscopy  Needle and Epidural Catheter: quincke  Needle size: 25 G  Injection procedure: Incremental injection and Negative aspiration for blood  Patient tolerance: Patient tolerated the procedure well with no immediate complications. Pressure was applied, and hemostasis was accomplished.  Comments: DESCRIPTION OF PROCEDURE:  The procedure risks and benefits were explained, and informed consent was obtained from the patient. The patient was placed in the prone position on the fluoroscopy table.  Blood pressure cuff and oxygen saturation monitors were attached and the patient was monitored throughout the entire procedure.  The L4 and L5 vertebral levels were identified with the use of fluoroscopy in AP view.  The skin was prepped using Chlorhexadine and draped in aseptic fashion.  The C-arm was obliqued to the left to visualize the junction of the transverse process and superior articular process of the L4 and L5 vertebrae.  The skin and subcutaneous tissue was anesthetized using 2 mL of 1 percent lidocaine with a 27-gauge needle.  A 3.5-inch 25-gauge spinal needle was advanced parallel to the x-ray beam towards the junction of the superior articular process and transverse process to the anatomic position of the L3 and L4 medial branches.  An equivalent needle was then advanced parallel towards the anatomic position of the L5 primary dorsal ramus at the sacral ala.  The needle tips were advanced slowly until the tip of the needle made contact with bone.  After negative aspiration, 0.5 mL of 0.5 percent bupivacaine was injected at each level.  The needles were then removed.    Attention was then taken to the right side where the L4 and L5 vertebral levels were identified with the use of fluoroscopy in AP view.  The C-arm was obliqued to visualize the junction of the transverse process and superior articular process of L4 and L5 on the right.  The skin and subcutaneous tissue was anesthetized using 2 mL of  1 percent lidocaine with a 27-gauge needle.  A 3.5-inch 25-gauge spinal needle was advanced parallel to the x-ray beam towards the junction of the superior articular process and transverse process to the anatomic position of the L3 and L4 medial branches.  An equivalent needle was then advanced parallel towards the anatomic position of the L5 primary dorsal ramus at the sacral ala.  The needle tips were advanced slowly until the tip of the needle made contact with bone.  After negative aspiration, 0.5 mL of 0.5 percent bupivacaine was injected at each level.  The needles were then removed.  There is no sensory or motor deficit of extremity noted.     There were no complications.  The patient tolerated the procedure well and was brought to the recovery room for observation in stable condition.     Plan of care:  If patient has a positive response, the patient is to follow up to have repeat medial branch block.  They were advised to contact the [facility] for any of the following:  Fever, chills, or night sweats.  New onset of severe sharp pain.  Any new upper or lower extremity weakness or numbness.  Any questions regarding the procedure.     If the patient is unable contact [facility], the patient was told to go to the local emergency room.             Estimated blood loss: none or minimal  Specimens: none  Patient tolerated the procedure well with no immediate complications. Pressure was applied, and hemostasis was accomplished.

## 2020-03-13 ENCOUNTER — Encounter: Admit: 2020-03-13 | Discharge: 2020-03-13 | Payer: BC Managed Care – PPO

## 2020-03-16 ENCOUNTER — Encounter: Admit: 2020-03-16 | Discharge: 2020-03-16 | Payer: BC Managed Care – PPO

## 2020-03-19 ENCOUNTER — Encounter: Admit: 2020-03-19 | Discharge: 2020-03-19 | Payer: BC Managed Care – PPO

## 2020-03-19 ENCOUNTER — Ambulatory Visit: Admit: 2020-03-19 | Discharge: 2020-03-19 | Payer: BC Managed Care – PPO

## 2020-03-19 DIAGNOSIS — M47816 Spondylosis without myelopathy or radiculopathy, lumbar region: Secondary | ICD-10-CM

## 2020-03-19 DIAGNOSIS — M2560 Stiffness of unspecified joint, not elsewhere classified: Secondary | ICD-10-CM

## 2020-03-19 DIAGNOSIS — R519 Generalized headaches: Secondary | ICD-10-CM

## 2020-03-19 DIAGNOSIS — M1990 Unspecified osteoarthritis, unspecified site: Secondary | ICD-10-CM

## 2020-03-19 DIAGNOSIS — G4730 Sleep apnea, unspecified: Secondary | ICD-10-CM

## 2020-03-19 DIAGNOSIS — I10 Essential (primary) hypertension: Secondary | ICD-10-CM

## 2020-03-19 DIAGNOSIS — H269 Unspecified cataract: Secondary | ICD-10-CM

## 2020-03-19 DIAGNOSIS — M2550 Pain in unspecified joint: Secondary | ICD-10-CM

## 2020-03-19 DIAGNOSIS — F329 Major depressive disorder, single episode, unspecified: Secondary | ICD-10-CM

## 2020-03-19 DIAGNOSIS — IMO0002 Ulcer: Secondary | ICD-10-CM

## 2020-03-19 DIAGNOSIS — E785 Hyperlipidemia, unspecified: Secondary | ICD-10-CM

## 2020-03-19 MED ORDER — LIDOCAINE (PF) 20 MG/ML (2 %) IJ SOLN
5 mL | Freq: Once | INTRAMUSCULAR | 0 refills | Status: CP
Start: 2020-03-19 — End: ?

## 2020-03-19 MED ORDER — LIDOCAINE (PF) 10 MG/ML (1 %) IJ SOLN
10 mL | Freq: Once | INTRAMUSCULAR | 0 refills | Status: CP
Start: 2020-03-19 — End: ?

## 2020-03-19 NOTE — Procedures
Attending Surgeon: Curly Rim, MD    Anesthesia: Local    Pre-Procedure Diagnosis:   1. Lumbar spondylosis        Post-Procedure Diagnosis:   1. Lumbar spondylosis        Medial Branch Block/Facet Lumbar/Sacral  Procedure: medial branch block    Laterality: bilateral    Location: lumbar - L4-5 and L5-S1      Consent:   Consent obtained: verbal and written  Consent given by: patient  Risks discussed: allergic reaction, bruising, infection, nerve damage, no change or worsening in pain, reaction to medication, seizure and weakness    Discussed with patient the purpose of the treatment/procedure, other ways of treating my condition, including no treatment/ procedure and the risks and benefits of the alternatives. Patient has decided to proceed with treatment/procedure.        Universal Protocol:  Relevant documents: relevant documents present and verified  Imaging studies: imaging studies available  Site marked: the operative site was marked  Patient identity confirmed: Patient identify confirmed verbally with patient.        Time out: Immediately prior to procedure a time out was called to verify the correct patient, procedure, equipment, support staff and site/side marked as required      Procedures Details:   Indications: diagnostic evaluation   Prep: chlorhexidine  Patient position: prone  Estimated Blood Loss: minimal  Specimens: none  Number of Levels: 2  Guidance: fluoroscopy  Needle and Epidural Catheter: quincke  Needle size: 25 G  Injection procedure: Incremental injection and Negative aspiration for blood  Patient tolerance: Patient tolerated the procedure well with no immediate complications. Pressure was applied, and hemostasis was accomplished.  Comments: DESCRIPTION OF PROCEDURE:  The procedure risks and benefits were explained, and informed consent was obtained from the patient.  The patient was placed in the prone position on the fluoroscopy table.  Blood pressure cuff and oxygen saturation monitors were attached and the patient was monitored throughout the entire procedure.  The L4 and L5 vertebral levels were identified with the use of fluoroscopy in AP view.  The skin was prepped using Chlorhexadine and draped in aseptic fashion.  The C-arm was obliqued to the left to visualize the junction of the transverse process and superior articular process of the L4 and L5 vertebrae.  The skin and subcutaneous tissue was anesthetized using 2 mL of 1 percent lidocaine with a 27-gauge needle.  A 3.5-inch 25-gauge spinal needle was advanced parallel to the x-ray beam towards the junction of the superior articular process and transverse process to the anatomic position of the L3 and L4 medial branches.  An equivalent needle was then advanced parallel towards the anatomic position of the L5 primary dorsal ramus at the sacral ala.  The needle tips were advanced slowly until the tip of the needle made contact with bone.  After negative aspiration, 0.5 mL of 2 percent lidocaine was injected at each level.  The needles were then removed.    Attention was then taken to the right side where the L4 and L5 vertebral levels were identified with the use of fluoroscopy in AP view.  The C-arm was obliqued to visualize the junction of the transverse process and superior articular process of L4 and L5 on the right.  The skin and subcutaneous tissue was anesthetized using 2 mL of 1 percent lidocaine with a 27-gauge needle.  A 3.5-inch 25-gauge spinal needle was advanced parallel to the x-ray beam towards the junction of the superior  articular process and transverse process to the anatomic position of the L3 and L4 medial branches.  An equivalent needle was then advanced parallel towards the anatomic position of the L5 primary dorsal ramus at the sacral ala.  The needle tips were advanced slowly until the tip of the needle made contact with bone.  After negative aspiration, 0.5 mL of 2 percent lidocaine was injected at each level.  The needles were then removed.  There is no sensory or motor deficit of extremity noted.     There were no complications.  The patient tolerated the procedure well and was brought to the recovery room for observation in stable condition.     Plan of care:  If patient has a positive response, the patient is to follow up to have radiofrequency ablation performed.  They were advised to contact the [facility] for any of the following:  Fever, chills, or night sweats.  New onset of severe sharp pain.  Any new upper or lower extremity weakness or numbness.  Any questions regarding the procedure.     If the patient is unable contact [facility], the patient was told to go to the local emergency room.             Estimated blood loss: none or minimal  Specimens: none  Patient tolerated the procedure well with no immediate complications. Pressure was applied, and hemostasis was accomplished.

## 2020-03-19 NOTE — Progress Notes
SPINE CENTER  INTERVENTIONAL PAIN PROCEDURE HISTORY AND PHYSICAL    Chief Complaint   Patient presents with   ? Procedure       HISTORY OF PRESENT ILLNESS:  Jared Hebert is a 69 y.o. year old male who presents for injection.  Denies fevers, chills, or recent hospitalizations.  No allergies to contrast, latex, seafood, iodine, or shellfish.  Patient denies blood thinning medications.        Medical History:   Diagnosis Date   ? Arthritis     back & shoulders   ? Cataract 5 Years ago   ? Depression 20+ Yrs   ? Generalized headaches Most of adult life   ? Hyperlipidemia    ? Hypertension    ? Joint pain 10 yrs ago   ? Joint stiffness 2009   ? Sleep apnea    ? Ulcer 12/2019       Surgical History:   Procedure Laterality Date   ? VASECTOMY  1984   ? HX CATARACT REMOVAL Bilateral 02/2017    [facility]   ? MENISCECTOMY Left 2019   ? PARS PLANA MECHANICAL VITRECTOMY WITH  [place]olog Right 09/02/2018    Performed by Blair Hailey, MD at [facility] [place]   ? PARS PLANA MECHANICAL VITRECTOMY Left 11/04/2018    Performed by Blair Hailey, MD at [facility]   ? HX ADENOIDECTOMY     ? HX ARTHROSCOPIC SURGERY  4 yrs ago    Left Knee Scope   ? HX TONSILLECTOMY     ? KNEE SURGERY  Left Knee Scope 4 yrs ago   ? NASAL SURGERY      TWICE       family history includes Arthritis in his father and mother; Back pain in his brother, father, and mother; Cataract in his father and mother; Depression in his brother; Heart Disease in his father; Heart problem in his father; Hypertension in his father; Joint Pain in his brother, father, and mother; Osteoporosis in his mother.    Social History     Socioeconomic History   ? Marital status: Married     Spouse name: Not on file   ? Number of children: Not on file   ? Years of education: Not on file   ? Highest education level: Not on file   Occupational History   ? Not on file   Tobacco Use   ? Smoking status: Former Smoker     Packs/day: 1.00     Years: 10.00     Pack years: 10.00     Types: Cigarettes Start date: 08/04/1967     Quit date: 2008     Years since quitting: 13.2   ? Smokeless tobacco: Former Neurosurgeon   ? Tobacco comment: 15 + yrs since last smoked   Substance and Sexual Activity   ? Alcohol use: Not Currently     Alcohol/week: 2.0 standard drinks     Types: 2 Cans of beer per week     Frequency: Never     Comment: Social drinker   ? Drug use: Never   ? Sexual activity: Yes     Partners: Female     Birth control/protection: None   Other Topics Concern   ? Not on file   Social History Narrative   ? Not on file       Allergies   Allergen Reactions   ? Seasonal Allergies SNEEZING       There were no  vitals filed for this visit.    REVIEW OF SYSTEMS: 10 point ROS obtained and negative except for back pain      PHYSICAL EXAM:  General: 69 y.o. male appears stated age, in no acute distress  HEENT: Normocephalic, atraumatic  Neck: No thyroidmegaly  Cardiovascular: Well perfused  Pulmonary: Unlabored respirations  Extremities: No cyanosis, clubbing, or edema  Skin: No lesions seen on exposed skin  Psychiatric:  Appropriate mood and affect  Musculoskeletal: No atrophy.   Neurologic: Antigravity strength in all extremities. CN II -XII grossly intact.  Alert and oriented x 3.         IMPRESSION:    1. Lumbar spondylosis         PLAN:   Bilateral L3, L4, L5 MBB, #2/2

## 2020-03-19 NOTE — Discharge Instructions - Supplementary Instructions
Discharge Instructions for Medial Branch Block    Important information following your procedure today:     This injection is for diagnostic purposes, it is a test. Only short term results are expected.    1. Though the procedure is generally safe and complications are rare, we do ask that you be aware of any of the following:   ? Any swelling, persistent redness, new bleeding, or drainage from the site of the injection.  ? You should not experience a severe headache.  ? You should not run a fever over 101? F.  ? New onset of sharp, severe back & or neck pain.  ? New onset of upper or lower extremity numbness or weakness.  ? New difficulty controlling bowel or bladder function after the injection.  ? New shortness of breath.    If any of these occur, please call to report this occurrence to a nurse. If you are calling after 4:00 p.m. or on weekends or holidays please call [phone] and ask to have the resident physician on call for the physician paged or go to your local emergency room.  2. You may experience soreness at the injection site. Ice can be applied at 20 minute intervals. Avoid application of direct heat, hot showers or hot tubs today.  3. Patients taking a daily blood thinner can resume their regular dose this evening.  4. It is important that you take all medications ordered by your pain physician. Taking medication as ordered is an important part of your pain care plan. If you cannot continue the medication plan, please notify the physician.   5. Remain active today. Do the activities that would normally cause you pain.  6. It is important for you to keep track of the results of this test on paper.  ? Did you get relief?  ? Percentage of relief?  ? How long did it last?  Call back to report the results to a nurse. Use notes that you kept when giving your report. You may have to leave a message and a nurse will contact you to help you determine if you are a candidate for the Radiofrequency Ablation Procedure.    PAIN DIARY  Please report pain on 0-10 scale for each hour listed following discharge.  (0 = No pain; 5 = Moderate pain; 10 = Worst pain of your life)  TIME DAY 1 DAY 2   12AM MIDNIGHT     1AM       2AM       3AM       4AM        5AM       6AM       7AM       8AM       9AM       10AM       11AM       12PM NOON     1PM       2PM       3PM       4PM       5PM       6PM        7PM       8PM       9PM       10PM       11PM         Dr. Meredith Leeds nurse:       [phone]   Dr. Tamsen Roers nurse:  [phone]   Dr. Mellody Life nurse:      [phone]   Dr. Joan Flores nurse:      [phone]   Dr. Inez Pilgrim nurse:     [phone]   Dr. Larene Beach nurse:     [phone]   Dr. Arvil Chaco nurse:   [phone]   Dr. Michaelle Copas nurse:    [phone]   Dr. Juel Burrow nurse: [phone]           If you are unable to keep your upcoming appointment, please notify the Spine Center scheduler at [phone] at least 24 hours in advance. If you have questions for the surgery center, call [facility] at [phone].

## 2020-03-22 ENCOUNTER — Encounter: Admit: 2020-03-22 | Discharge: 2020-03-22 | Payer: BC Managed Care – PPO

## 2020-03-23 ENCOUNTER — Ambulatory Visit: Admit: 2020-03-23 | Discharge: 2020-03-23 | Payer: BC Managed Care – PPO

## 2020-03-23 ENCOUNTER — Encounter: Admit: 2020-03-23 | Discharge: 2020-03-23 | Payer: BC Managed Care – PPO

## 2020-03-23 DIAGNOSIS — E782 Mixed hyperlipidemia: Secondary | ICD-10-CM

## 2020-03-23 DIAGNOSIS — D509 Iron deficiency anemia, unspecified: Secondary | ICD-10-CM

## 2020-03-23 DIAGNOSIS — R790 Abnormal level of blood mineral: Secondary | ICD-10-CM

## 2020-03-23 LAB — CBC AND DIFF
Lab: 0 10*3/uL (ref 0–0.20)
Lab: 0.4 10*3/uL (ref 0–0.45)
Lab: 0.5 10*3/uL (ref 0–0.80)
Lab: 1 % (ref 0–2)
Lab: 1.8 10*3/uL (ref 1.0–4.8)
Lab: 11 % (ref 4–12)
Lab: 13 % (ref 11–15)
Lab: 14 g/dL (ref 13.5–16.5)
Lab: 2.3 10*3/uL (ref 1.8–7.0)
Lab: 236 K/UL (ref 150–400)
Lab: 32 pg (ref 26–34)
Lab: 34 g/dL (ref 32.0–36.0)
Lab: 35 % (ref 24–44)
Lab: 4.5 M/UL (ref 4.4–5.5)
Lab: 42 % (ref 40–50)
Lab: 45 % (ref >60)
Lab: 5.2 10*3/uL (ref 4.5–11.0)
Lab: 6.9 FL — ABNORMAL LOW (ref >60)
Lab: 8 % — ABNORMAL HIGH (ref 0–5)
Lab: 94 FL (ref 80–100)

## 2020-03-23 LAB — COMPREHENSIVE METABOLIC PANEL
Lab: 101 mg/dL — ABNORMAL HIGH (ref 70–100)
Lab: 106 MMOL/L (ref 98–110)
Lab: 143 MMOL/L (ref >40)
Lab: 4.8 MMOL/L — ABNORMAL HIGH (ref <100)

## 2020-03-23 LAB — IRON + BINDING CAPACITY + %SAT+ FERRITIN
Lab: 134 ug/dL (ref 50–185)
Lab: 413 ug/dL — ABNORMAL HIGH (ref 270–380)

## 2020-03-23 LAB — LIPID PROFILE
Lab: 178 mg/dL (ref <200)
Lab: 81 mg/dL (ref <150)

## 2020-03-28 ENCOUNTER — Encounter: Admit: 2020-03-28 | Discharge: 2020-03-28 | Payer: BC Managed Care – PPO

## 2020-03-28 DIAGNOSIS — I10 Essential (primary) hypertension: Secondary | ICD-10-CM

## 2020-03-28 DIAGNOSIS — E782 Mixed hyperlipidemia: Secondary | ICD-10-CM

## 2020-03-28 MED ORDER — ROSUVASTATIN 20 MG PO TAB
20 mg | ORAL_TABLET | Freq: Every day | ORAL | 3 refills | 90.00000 days | Status: AC
Start: 2020-03-28 — End: ?

## 2020-04-04 ENCOUNTER — Encounter: Admit: 2020-04-04 | Discharge: 2020-04-04 | Payer: BC Managed Care – PPO

## 2020-04-17 MED ORDER — BUPIVACAINE (PF) 0.25 % (2.5 MG/ML) IJ SOLN
10 mL | Freq: Once | INTRAMUSCULAR | 0 refills | Status: CP
Start: 2020-04-17 — End: ?
  Administered 2020-04-17: 18:00:00 10 mL via INTRAMUSCULAR

## 2020-04-17 MED ORDER — LIDOCAINE (PF) 10 MG/ML (1 %) IJ SOLN
10 mL | Freq: Once | INTRAMUSCULAR | 0 refills | Status: CP
Start: 2020-04-17 — End: ?
  Administered 2020-04-17: 18:00:00 10 mL via INTRAMUSCULAR

## 2020-04-17 MED ORDER — MIDAZOLAM 1 MG/ML IJ SOLN
1-2 mg | INTRAVENOUS | 0 refills | Status: DC | PRN
Start: 2020-04-17 — End: 2020-04-17

## 2020-04-17 MED ORDER — FENTANYL CITRATE (PF) 50 MCG/ML IJ SOLN
50-100 ug | INTRAVENOUS | 0 refills | Status: DC | PRN
Start: 2020-04-17 — End: 2020-04-17

## 2020-04-19 MED ORDER — FOLIC ACID 1 MG PO TAB
ORAL_TABLET | Freq: Every day | 3 refills | 30.00000 days | Status: AC
Start: 2020-04-19 — End: ?

## 2020-04-23 NOTE — Progress Notes
Pt hospitalized since last office visit: No    Health Maintenance Due   Topic Date Due   ? HEPATITIS C SCREENING  Never done   ? SHINGLES RECOMBINANT VACCINE (1 of 2) Never done       There are no diagnoses linked to this encounter.    Labs not done:      Lab Frequency Next Occurrence   COMPREHENSIVE METABOLIC PANEL Once 06/27/2020   LIPID PROFILE Once 06/27/2020       Labs drawn recently by Dr Vivianne Spence.    Notes to provider:

## 2020-04-30 ENCOUNTER — Encounter: Admit: 2020-04-30 | Discharge: 2020-04-30 | Payer: BC Managed Care – PPO

## 2020-04-30 ENCOUNTER — Ambulatory Visit: Admit: 2020-04-30 | Discharge: 2020-04-30 | Payer: BC Managed Care – PPO

## 2020-04-30 DIAGNOSIS — R519 Generalized headaches: Secondary | ICD-10-CM

## 2020-04-30 DIAGNOSIS — E785 Hyperlipidemia, unspecified: Secondary | ICD-10-CM

## 2020-04-30 DIAGNOSIS — H269 Unspecified cataract: Secondary | ICD-10-CM

## 2020-04-30 DIAGNOSIS — M1990 Unspecified osteoarthritis, unspecified site: Secondary | ICD-10-CM

## 2020-04-30 DIAGNOSIS — F329 Major depressive disorder, single episode, unspecified: Secondary | ICD-10-CM

## 2020-04-30 DIAGNOSIS — I10 Essential (primary) hypertension: Secondary | ICD-10-CM

## 2020-04-30 DIAGNOSIS — IMO0002 Ulcer: Secondary | ICD-10-CM

## 2020-04-30 DIAGNOSIS — E782 Mixed hyperlipidemia: Secondary | ICD-10-CM

## 2020-04-30 DIAGNOSIS — Z125 Encounter for screening for malignant neoplasm of prostate: Secondary | ICD-10-CM

## 2020-04-30 DIAGNOSIS — M545 Low back pain: Secondary | ICD-10-CM

## 2020-04-30 DIAGNOSIS — M2560 Stiffness of unspecified joint, not elsewhere classified: Secondary | ICD-10-CM

## 2020-04-30 DIAGNOSIS — G4730 Sleep apnea, unspecified: Secondary | ICD-10-CM

## 2020-04-30 DIAGNOSIS — D649 Anemia, unspecified: Secondary | ICD-10-CM

## 2020-04-30 DIAGNOSIS — F419 Anxiety disorder, unspecified: Secondary | ICD-10-CM

## 2020-04-30 DIAGNOSIS — K257 Chronic gastric ulcer without hemorrhage or perforation: Secondary | ICD-10-CM

## 2020-04-30 DIAGNOSIS — M2550 Pain in unspecified joint: Secondary | ICD-10-CM

## 2020-04-30 DIAGNOSIS — F5101 Primary insomnia: Secondary | ICD-10-CM

## 2020-04-30 MED ORDER — ZOLPIDEM 5 MG PO TAB
5 mg | ORAL_TABLET | Freq: Every evening | ORAL | 5 refills | 30.00000 days | Status: DC | PRN
Start: 2020-04-30 — End: 2020-07-23

## 2020-04-30 NOTE — Telephone Encounter
Received fax from pharm for PA for Zolpidem.    Spoke to pharm to retain phone number for PA.    Spoke to insurance to initiate PA.  Approval given through 04/30/21.    Spoke to pharm to inform.

## 2020-04-30 NOTE — Patient Instructions
Get fasting labs few days before return to clinic in 3 months

## 2020-05-09 MED ORDER — CELECOXIB 50 MG PO CAP
50 mg | ORAL_CAPSULE | Freq: Two times a day (BID) | ORAL | 0 refills | Status: DC
Start: 2020-05-09 — End: 2020-06-06

## 2020-05-10 ENCOUNTER — Encounter: Admit: 2020-05-10 | Discharge: 2020-05-10 | Payer: BC Managed Care – PPO

## 2020-05-24 ENCOUNTER — Encounter: Admit: 2020-05-24 | Discharge: 2020-05-24 | Payer: BC Managed Care – PPO

## 2020-05-24 DIAGNOSIS — M545 Low back pain: Secondary | ICD-10-CM

## 2020-05-25 ENCOUNTER — Encounter: Admit: 2020-05-25 | Discharge: 2020-05-25 | Payer: BC Managed Care – PPO

## 2020-05-25 ENCOUNTER — Ambulatory Visit: Admit: 2020-05-25 | Discharge: 2020-05-25 | Payer: BC Managed Care – PPO

## 2020-05-25 DIAGNOSIS — M5126 Other intervertebral disc displacement, lumbar region: Secondary | ICD-10-CM

## 2020-05-25 DIAGNOSIS — M5416 Radiculopathy, lumbar region: Secondary | ICD-10-CM

## 2020-05-25 DIAGNOSIS — M545 Low back pain: Secondary | ICD-10-CM

## 2020-05-25 MED ORDER — TRIAMCINOLONE ACETONIDE 40 MG/ML IJ SUSP
80 mg | Freq: Once | EPIDURAL | 0 refills | Status: CP
Start: 2020-05-25 — End: ?
  Administered 2020-05-25: 15:00:00 80 mg via EPIDURAL

## 2020-05-25 MED ORDER — IOPAMIDOL 41 % IT SOLN
2.5 mL | Freq: Once | EPIDURAL | 0 refills | Status: CP
Start: 2020-05-25 — End: ?
  Administered 2020-05-25: 15:00:00 2.5 mL via EPIDURAL

## 2020-05-25 NOTE — Progress Notes

## 2020-05-25 NOTE — Patient Instructions
Procedure Completed Today: Lumbar Epidural Steroid Injection    Important information following your procedure today: You may drive today    1. Pain relief may not be immediate. It is possible you may even experience an increase in pain during the first 24-48 hours followed by a gradual decrease of your pain.  2. Though the procedure is generally safe and complications are rare, we do ask that you be aware of any of the following:   ? Any swelling, persistent redness, new bleeding, or drainage from the site of the injection.  ? You should not experience a severe headache.  ? You should not run a fever over 101? F.  ? New onset of sharp, severe back & or neck pain.  ? New onset of upper or lower extremity numbness or weakness.  ? New difficulty controlling bowel or bladder function after the injection.  ? New shortness of breath.    If any of these occur, please call to report this occurrence to a nurse at [phone]. If you are calling after 4:00 p.m., on weekends or holidays please call [phone] and ask to have the resident physician on call for the physician paged or go to your local emergency room.  3. You may experience soreness at the injection site. Ice can be applied at 20 minute intervals. Avoid application of direct heat, hot showers or hot tubs today.  4. Avoid strenuous activity today. You may resume your regular activities and exercise tomorrow.  5. Patients with diabetes may see an elevation in blood sugars for 7-10 days after the injection. It is important to pay close attention to your diet, check your blood sugars daily and report extreme elevations to the physician that treats your diabetes.  6. Patients taking a daily blood thinner can resume their regular dose this evening.  7. It is important that you take all medications ordered by your pain physician. Taking medication as ordered is an important part of your pain care plan. If you cannot continue the medication plan, please notify the physician.     Possible side effects to steroids that may occur:  ? Flushing or redness of the face  ? Irritability  ? Fluid retention  ? Change in women?s menses    The following medications were used: Lidocaine , Triamcinolone   and Contrast Dye

## 2020-05-25 NOTE — Procedures
Attending Surgeon: Joanie Coddington, MD    Anesthesia: Local    Pre-Procedure Diagnosis:   1. Lumbar disc herniation    2. Lumbar radiculopathy        Post-Procedure Diagnosis:   1. Lumbar disc herniation    2. Lumbar radiculopathy        [place] [facility] INJECT INTERLAM LMBR/SAC  Procedure: epidural - interlaminar    Laterality: n/a   on 05/25/2020 12:45 PM  Location: lumbar ESI with imaging - L5-S1      Consent:   Consent obtained: verbal and written  Consent given by: patient  Risks discussed: allergic reaction, bleeding, bruising, infection, nerve damage, no change or worsening in pain, weakness and reaction to medication    Discussed with patient the purpose of the treatment/procedure, other ways of treating my condition, including no treatment/ procedure and the risks and benefits of the alternatives. Patient has decided to proceed with treatment/procedure.        Universal Protocol:  Relevant documents: relevant documents present and verified  Test results: test results available and properly labeled  Imaging studies: imaging studies available  Required items: required blood products, implants, devices, and special equipment available  Site marked: the operative site was marked  Patient identity confirmed: Patient identify confirmed verbally with patient.        Time out: Immediately prior to procedure a time out was called to verify the correct patient, procedure, equipment, support staff and site/side marked as required      Procedures Details:   Indications: pain   Prep: Betadine  Patient position: prone  Estimated Blood Loss: minimal  Specimens: none  Number of Joints: 1  Approach: midline  Guidance: fluoroscopy  Contrast: Procedure confirmed with contrast under live fluoroscopy.  Needle and Epidural Catheter: tuohy  Needle size: 20 G  Injection procedure: Incremental injection and Negative aspiration for blood  Patient tolerance: Patient tolerated the procedure well with no immediate complications. Pressure was applied, and hemostasis was accomplished.  Comments: DESCRIPTION OF PROCEDURE:  The procedure risks and benefits were explained to the patient and informed consent was obtained.  The patient was positioned prone on the fluoroscopy table with a pillow under the abdomen to help reduce lumbar lordosis.  Blood pressure cuff and oxygen saturation monitors were attached and the patient was monitored throughout the entire procedure.  The L5 vertebral level was identified with the use of fluoroscopy in the AP view.  The skin was prepped using betadine and draped in aseptic fashion.  The skin and subcutaneous tissue were anesthetized using 3 mL of 1 percent lidocaine with a 27-gauge needle.  A 3.5 inch 22-gauge Tuohy needle was slowly advanced using AP view towards the midline L5-S1 epidural space.  The latter part of the needle advancement was guided with fluoroscopy in the lateral view.  The epidural space was identified using loss of resistance technique.  After negative aspiration, 1 mL of contrast dye was injected.  After epidural spread was seen, a solution containing 80 mg of triamcinolone and 2 mL of normal saline was injected in increments.  The stylet was reinserted and the needle was then removed.     After the procedure, the patient's blood pressure, heart rate, oxygen saturation, and VAS were recorded in the chart. There were no complications.  The patient tolerated the procedure well and was brought to recovery room for observation in stable condition and discharged with written discharge instructions.     PLAN OF CARE:  The  patient is to followup in 3 weeks.    The patient was advised to contact the [facility] for any of the following    Fever, chills, or night sweats.  New onset of severe sharp pain.  Any new upper or lower extremity weakness or numbness.  Any questions regarding the procedure.            Estimated blood loss: none or minimal  Specimens: none  Patient tolerated the procedure well with no immediate complications. Pressure was applied, and hemostasis was accomplished.

## 2020-05-25 NOTE — Progress Notes
SPINE CENTER  INTERVENTIONAL PAIN PROCEDURE HISTORY AND PHYSICAL    Chief Complaint   Patient presents with   ? Lower Back - Pain       HISTORY OF PRESENT ILLNESS:  Jared Hebert is a 69 y.o. year old male who presents for injection.  Denies fevers, chills, or recent hospitalizations.  Patient denies currently taking blood thinning medications.        Medical History:   Diagnosis Date   ? Arthritis     back & shoulders   ? Cataract 5 Years ago   ? Depression 20+ Yrs   ? Generalized headaches Most of adult life   ? Hyperlipidemia    ? Hypertension    ? Joint pain 10 yrs ago   ? Joint stiffness 2009   ? Sleep apnea    ? Ulcer 12/2019       Surgical History:   Procedure Laterality Date   ? VASECTOMY  1984   ? HX CATARACT REMOVAL Bilateral 02/2017    [facility]   ? MENISCECTOMY Left 2019   ? PARS PLANA MECHANICAL VITRECTOMY WITH  [place] Right 09/02/2018    Performed by Blair Hailey, MD at [facility] [place]   ? PARS PLANA MECHANICAL VITRECTOMY Left 11/04/2018    Performed by Blair [facility]iley, [facility] at [facility] [place]   ? HX ADENOIDECTOMY     ? HX ARTHROSCOPIC SURGERY  4 yrs ago    Left Knee Scope   ? HX TONSILLECTOMY     ? KNEE SURGERY  Left Knee Scope 4 yrs ago   ? NASAL SURGERY      TWICE       family history includes Arthritis in his father and mother; Back pain in his brother, father, and mother; Cataract in his father and mother; Depression in his brother; Heart Disease in his father; Heart problem in his father; Hypertension in his father; Joint Pain in his brother, father, and mother; Osteoporosis in his mother.    Social History     Socioeconomic History   ? Marital status: Married     Spouse name: Not on file   ? Number of children: Not on file   ? Years of education: Not on file   ? Highest education level: Not on file   Occupational History   ? Not on file   Tobacco Use   ? Smoking status: Former Smoker     Packs/day: 1.00     Years: 10.00     Pack years: 10.00     Types: Cigarettes     Start date: 08/04/1967     Quit date: 2008 Years since quitting: 13.4   ? Smokeless tobacco: Former Neurosurgeon   ? Tobacco comment: 15 + yrs since last smoked   Substance and Sexual Activity   ? Alcohol use: Not Currently     Alcohol/week: 2.0 standard drinks     Types: 2 Cans of beer per week     Comment: Social drinker   ? Drug use: Never   ? Sexual activity: Yes     Partners: Female     Birth control/protection: None   Other Topics Concern   ? Not on file   Social History Narrative   ? Not on file       Allergies   Allergen Reactions   ? Seasonal Allergies SNEEZING       Vitals:    05/25/20 0941 05/25/20 0948   BP: 116/77  BP Source: Arm, Right Upper    Patient Position: Sitting    Pulse: 66    Resp: 16    Temp: 36.2 ?C (97.2 ?F)    TempSrc: Oral    SpO2: 97%    Weight: 78.9 kg (174 lb)    Height: 175.3 cm (69)    PainSc: Three Three       REVIEW OF SYSTEMS: 10 point ROS obtained and negative except for back pain      PHYSICAL EXAM:  General: 69 y.o. male appears stated age, in no acute distress  HEENT: Normocephalic, atraumatic  Neck: No thyroidmegaly  Cardiovascular: Well perfused  Pulmonary: Unlabored respirations  Extremities: No cyanosis, clubbing, or edema  Skin: No lesions seen on exposed skin  Psychiatric:  Appropriate mood and affect  Musculoskeletal: No atrophy.   Neurologic: Antigravity strength in all extremities. CN II -XII grossly intact.  Alert and oriented x 3.         IMPRESSION:    1. Lumbar disc herniation    2. Lumbar radiculopathy         PLAN:   L5-S1 ILESI

## 2020-06-06 ENCOUNTER — Encounter: Admit: 2020-06-06 | Discharge: 2020-06-06 | Payer: BC Managed Care – PPO

## 2020-06-06 MED ORDER — CELECOXIB 50 MG PO CAP
ORAL_CAPSULE | Freq: Two times a day (BID) | 0 refills | Status: AC
Start: 2020-06-06 — End: ?

## 2020-06-06 NOTE — Telephone Encounter
Last O/V: 05/09/2020  F/U Visit: 06/19/2020    Routing to Provider for Review

## 2020-06-19 ENCOUNTER — Ambulatory Visit: Admit: 2020-06-19 | Discharge: 2020-06-19 | Payer: BC Managed Care – PPO

## 2020-06-19 ENCOUNTER — Encounter: Admit: 2020-06-19 | Discharge: 2020-06-19 | Payer: BC Managed Care – PPO

## 2020-06-19 DIAGNOSIS — F329 Major depressive disorder, single episode, unspecified: Secondary | ICD-10-CM

## 2020-06-19 DIAGNOSIS — IMO0002 Ulcer: Secondary | ICD-10-CM

## 2020-06-19 DIAGNOSIS — E785 Hyperlipidemia, unspecified: Secondary | ICD-10-CM

## 2020-06-19 DIAGNOSIS — Z92241 Personal history of systemic steroid therapy: Secondary | ICD-10-CM

## 2020-06-19 DIAGNOSIS — R519 Generalized headaches: Secondary | ICD-10-CM

## 2020-06-19 DIAGNOSIS — H269 Unspecified cataract: Secondary | ICD-10-CM

## 2020-06-19 DIAGNOSIS — I10 Essential (primary) hypertension: Secondary | ICD-10-CM

## 2020-06-19 DIAGNOSIS — M2560 Stiffness of unspecified joint, not elsewhere classified: Secondary | ICD-10-CM

## 2020-06-19 DIAGNOSIS — M545 Low back pain: Secondary | ICD-10-CM

## 2020-06-19 DIAGNOSIS — M7918 Myalgia, other site: Secondary | ICD-10-CM

## 2020-06-19 DIAGNOSIS — M5416 Radiculopathy, lumbar region: Secondary | ICD-10-CM

## 2020-06-19 DIAGNOSIS — M1990 Unspecified osteoarthritis, unspecified site: Secondary | ICD-10-CM

## 2020-06-19 DIAGNOSIS — M2550 Pain in unspecified joint: Secondary | ICD-10-CM

## 2020-06-19 DIAGNOSIS — G4730 Sleep apnea, unspecified: Secondary | ICD-10-CM

## 2020-06-19 MED ORDER — GABAPENTIN 300 MG PO CAP
300 mg | ORAL_CAPSULE | ORAL | 1 refills | Status: AC
Start: 2020-06-19 — End: ?

## 2020-06-19 NOTE — Progress Notes
SPINE CENTER CLINIC NOTE       SUBJECTIVE:   Jared Hebert is a 69 y.o.-year-old male who presents for follow-up after L5-S1 interlaminar epidural steroid injection on 05/25/20. Patient reports no relief with the injection. Patient continues to have burning pain in the gluteal region that has increased in severity since last injection. He also has burning in the left anterior thigh but this does not radiate past the knee. Pain is worse with prolonged standing, forward flexion when standing over the operating table, and sitting more than 45 minutes. Pain is worse at night and disturbs his sleep. Pain is at preprocedure level. VAS pain score is rated a 4/10. Denies balance difficulties or overt weakness in extremities. Denies recent falls. Denies any loss of control of bowel or bladder.        Review of Systems    Current Outpatient Medications:   ?  ascorbic acid (VITAMIN C) 500 mg tablet, Take 500 mg by mouth daily., Disp: , Rfl:   ?  aspirin EC 81 mg tablet, Take 81 mg by mouth daily. Take with food., Disp: , Rfl:   ?  Celecoxib 50 mg cap, TAKE 1 CAPSULE BY MOUTH TWICE A DAY, Disp: 60 capsule, Rfl: 0  ?  cetirizine (ZYRTEC) 10 mg tablet, Take 10 mg by mouth., Disp: , Rfl:   ?  citalopram (CELEXA) 40 mg tablet, Take one tablet by mouth daily., Disp: 90 tablet, Rfl: 3  ?  cyclobenzaprine (FLEXERIL) 5 mg tablet, Take 1-2 tablets by mouth daily as needed., Disp: , Rfl:   ?  fluticasone propionate (FLONASE) 50 mcg/actuation nasal spray, suspension, Apply 1 spray to each nostril as directed daily. Shake bottle gently before using., Disp: , Rfl:   ?  folic acid (FOLVITE) 1 mg tablet, TAKE 1 TABLET BY MOUTH EVERY DAY, Disp: 90 tablet, Rfl: 3  ?  gabapentin (NEURONTIN) 300 mg capsule, Take one capsule by mouth every 8 hours., Disp: 90 capsule, Rfl: 1  ?  glucosamine(+) 500 mg tab, Take 1,500 mg by mouth once., Disp: , Rfl:   ?  lifitegrast (XIIDRA) 5 % ophthalmic drop, Apply one drop to both eyes twice daily., Disp: 60 each, Rfl: 11  ?  losartan (COZAAR) 25 mg tablet, Take one tablet by mouth daily. Indications: pt unsure of doseage, Disp: 90 tablet, Rfl: 3  ?  rosuvastatin (CRESTOR) 20 mg tablet, Take one tablet by mouth daily., Disp: 90 tablet, Rfl: 3  ?  zolpidem (AMBIEN) 5 mg tablet, Take one tablet by mouth at bedtime as needed for Sleep., Disp: 30 tablet, Rfl: 5  Allergies   Allergen Reactions   ? Seasonal Allergies SNEEZING     Physical Exam  Vitals:    06/19/20 1427   BP: 120/89   BP Source: Arm, Right Upper   Patient Position: Sitting   Pulse: 71   Temp: 36.8 ?C (98.3 ?F)   TempSrc: Oral   SpO2: 95%   Weight: 78.9 kg (174 lb)   Height: 175.3 cm (69)   PainSc: Four        Pain Score: Four  Body mass index is 25.7 kg/m?Marland Kitchen  General: 69 y.o. male appears stated age, in no acute distress  HEENT: Normocephalic, atraumatic  Neck: No thyroidmegaly  Cardiovascular: Well perfused  Pulmonary: Unlabored respirations  Extremities: No cyanosis, clubbing, or edema  Skin: Warm and dry  Psychiatric:  Appropriate mood and affect  Musculoskeletal: Full range of motion with lumbar flexion, extension, and lateral  rotation.  Tender to palpation greatest at bilateral gluteus medius as well as some tenderness of the belly of the right greater than left hamstring. Facet loading is positive bilaterally.  FABER is uncomfortable on the left.  FAIR is negative bilaterally.   Neurologic: Bilateral hip abduction and right greater than left resisted knee flexion 4/5 otherwise, lower extremity myotomes are all 5/5.  Lower extremity dermatomes are all intact to light touch.  Deep tendon reflexes are symmetric at patella and achilles. Negative slump test bilaterally. Downward Babinski. No ankle clonus.        IMPRESSION:  1. Chronic bilateral low back pain without sciatica    2. Gluteal pain    3. Lumbar radiculopathy    4. S/P epidural steroid injection        PLAN:    1.  Lifestyle modifications.  Recommend activity as tolerated.  Avoid provocative maneuvers.  Keep spine in neutral position.  2.  Medications.  Patient provided prescription for Gabapentin will start at 300mg  and may titrate if needed. Medication usage and safety reviewed.  3.  Therapy.  Continue with home exercises.  4.  Imaging.  None indicated at this time. Will discuss possible imaging of the hip/gluteal region.   5.  Interventions. Will discuss further interventions with Dr. Katrinka Blazing.   6.  Follow-up.  Patient to follow-up as needed.

## 2020-07-02 ENCOUNTER — Encounter: Admit: 2020-07-02 | Discharge: 2020-07-02 | Payer: BC Managed Care – PPO

## 2020-07-18 ENCOUNTER — Encounter: Admit: 2020-07-18 | Discharge: 2020-07-18 | Payer: BC Managed Care – PPO

## 2020-07-18 NOTE — Progress Notes
Pt hospitalized since last office visit: No    Health Maintenance Due   Topic Date Due   ? HEPATITIS C SCREENING  Never done   ? SHINGLES RECOMBINANT VACCINE (1 of 2) Never done   ? PHYSICAL (COMPREHENSIVE) EXAM  07/18/2020   ? PNEUMONIA (PPSV23) VACCINE (2 of 2 - PPSV23) 07/18/2020       There are no diagnoses linked to this encounter.    Labs not done:      Lab Frequency Next Occurrence   COMPREHENSIVE METABOLIC PANEL Once 06/27/2020   LIPID PROFILE Once 06/27/2020   PSA SCREEN Once 04/30/2020   CBC AND DIFF Once 04/30/2020   IRON + BINDING CAPACITY + %SAT+ FERRITIN Once 04/30/2020   [place] [facility] INJECT SNRB/TFESI LUMBAR/SACRAL         MyChart message sent to patient on 07/28/21Angie Jean Rosenthal, RN     Notes to provider:

## 2020-07-23 ENCOUNTER — Ambulatory Visit: Admit: 2020-07-23 | Discharge: 2020-07-24 | Payer: BC Managed Care – PPO

## 2020-07-23 ENCOUNTER — Encounter: Admit: 2020-07-23 | Discharge: 2020-07-23 | Payer: BC Managed Care – PPO

## 2020-07-23 DIAGNOSIS — R519 Generalized headaches: Secondary | ICD-10-CM

## 2020-07-23 DIAGNOSIS — H269 Unspecified cataract: Secondary | ICD-10-CM

## 2020-07-23 DIAGNOSIS — M2550 Pain in unspecified joint: Secondary | ICD-10-CM

## 2020-07-23 DIAGNOSIS — I10 Essential (primary) hypertension: Secondary | ICD-10-CM

## 2020-07-23 DIAGNOSIS — IMO0002 Ulcer: Secondary | ICD-10-CM

## 2020-07-23 DIAGNOSIS — F419 Anxiety disorder, unspecified: Secondary | ICD-10-CM

## 2020-07-23 DIAGNOSIS — G4730 Sleep apnea, unspecified: Secondary | ICD-10-CM

## 2020-07-23 DIAGNOSIS — E785 Hyperlipidemia, unspecified: Secondary | ICD-10-CM

## 2020-07-23 DIAGNOSIS — M545 Low back pain: Secondary | ICD-10-CM

## 2020-07-23 DIAGNOSIS — M1990 Unspecified osteoarthritis, unspecified site: Secondary | ICD-10-CM

## 2020-07-23 DIAGNOSIS — K257 Chronic gastric ulcer without hemorrhage or perforation: Secondary | ICD-10-CM

## 2020-07-23 DIAGNOSIS — E782 Mixed hyperlipidemia: Secondary | ICD-10-CM

## 2020-07-23 DIAGNOSIS — M2560 Stiffness of unspecified joint, not elsewhere classified: Secondary | ICD-10-CM

## 2020-07-23 DIAGNOSIS — F329 Major depressive disorder, single episode, unspecified: Secondary | ICD-10-CM

## 2020-07-23 DIAGNOSIS — F5101 Primary insomnia: Secondary | ICD-10-CM

## 2020-07-23 MED ORDER — ZOLPIDEM 5 MG PO TAB
5 mg | ORAL_TABLET | Freq: Every evening | ORAL | 5 refills | 30.00000 days | Status: AC | PRN
Start: 2020-07-23 — End: ?

## 2020-07-23 NOTE — Progress Notes
Administered Pneumovax 0.5mL into left detoid.  Patient signed consent and was given VIS sheet.  Patient tolerated injection well and had no complaints.

## 2020-07-23 NOTE — Progress Notes
Date of Service: 07/23/2020    Jared Hebert is a 69 y.o. male. DOB: 1951/06/16   MRN#: [id number]    Subjective: follow up       Back Pain  This is a chronic problem. The current episode started more than 1 month ago. The problem occurs intermittently. The pain is at a severity of 4/10. The pain is moderate. The pain is worse during the day. The symptoms are aggravated by bending and standing. Pertinent negatives include no abdominal pain, chest pain, dysuria, fever, headaches, numbness, weakness or weight loss. He has tried chiropractic manipulation and NSAIDs for the symptoms. The treatment provided no relief.   Hypertension  This is a chronic problem. The problem is controlled. Pertinent negatives include no anxiety, blurred vision, chest pain, headaches, malaise/fatigue, orthopnea, palpitations or shortness of breath.     Jared Hebert is 69 y.o. patient who presents to clinic for follow up. He established care 07/19/2019. He is a Engineer, civil (consulting) in [place]. No smoking, no alcohol, no street drugs. No regular exercise. He has 4 children.     He was last seen 04/2020    He is in clinic for low back pain for 6 weeks as above. 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   He was referred by cardiology to GI because of anemia. He did EGD and colonscopy 01/25/2020 and was found to have a non-bleeding gastric ulcer 15mm that was biopsied and is scheduled for repeat EGD in 2 months. Colonscopy was normal. Results of scopes are sent for scanning. I told him to stop all  NSAID and Mobic at this time. He had a repeat EGD 05/07/2020 by his outside GI doctor and was reported that gastric ulcer has healed.      His calcium score was 315. He saw cardiology 12/2019. He had CTA chest which was normal but There was incidental cholelithiasis and a right renal lesion that will require ultrasound for further definition.     He had a normal stress test 11/09/2020  He failed oral iron so cardiology ordered IV Iron.     He had labs drawn 03/2020    He had abdomen US 01/30/2020 which showed:  Small hypoechoic exophytic right renal nodule most compatible with a   hemorrhagic cyst   Cholelithiasis   Mild bladder wall thickening, nonspecific but most likely secondary to   bladder outlet stenosis or neurogenic bladder     He managed to lose intentionally 55 Lb over last 1 year by having ketogenic diet. He takes multivitamin daily.    He also struggles with depression but no intent to harm himself or others. His brother had depression and committed suicide in 2003.       Medical History:   Diagnosis Date   ? Arthritis     back & shoulders   ? Cataract 5 Years ago   ? Depression 20+ Yrs   ? Generalized headaches Most of adult life   ? Hyperlipidemia    ? Hypertension    ? Joint pain 10 yrs ago   ? Joint stiffness 2009   ? Sleep apnea    ? Ulcer 12/2019       Surgical History:   Procedure Laterality Date   ? VASECTOMY  1984   ? HX CATARACT REMOVAL Bilateral 02/2017    [facility]   ? MENISCECTOMY Left 2019   ? PARS PLANA MECHANICAL VITRECTOMY WITH  [place] Right 09/02/2018    Performed by Blair Hailey, [facility] at [facility]   ? PARS PLANA MECHANICAL VITRECTOMY Left 11/04/2018    Performed by Blair [facility]iley[facility] MD at [facility]   ? HX ADENOIDECTOMY     ? HX ARTHROSCOPIC SURGERY  4 yrs ago    Left Knee Scope   ? HX TONSILLECTOMY     ? KNEE SURGERY  Left Knee Scope 4 yrs ago   ? NASAL SURGERY      TWICE       family history includes Arthritis in his father and mother; Back pain in his brother, father, and mother; Cataract in his father and mother; Depression in his brother; Heart Disease in his father; Heart problem in his father; Hypertension in his father; Joint Pain in his brother, father, and mother; Osteoporosis in his mother.    Social History     Socioeconomic History   ? Marital status: Married     Spouse name: Not on file   ? Number of children: Not on file   ? Years of education: Not on file   ? Highest education level: Not on file   Occupational History   ? Not on file   Tobacco Use   ? Smoking status: Former Smoker     Packs/day: 1.00     Years: 10.00     Pack years: 10.00     Types: Cigarettes     Start date: 08/04/1967     Quit date: 2008     Years since quitting: 13.5   ? Smokeless tobacco: Former Neurosurgeon   ? Tobacco comment: 15 + yrs since last smoked   Substance and Sexual Activity   ? Alcohol use: Not Currently     Alcohol/week: 2.0 standard drinks     Types: 2 Cans of beer per week     Comment: Social drinker   ? Drug use: Never   ? Sexual activity: Yes     Partners: Female     Birth control/protection: None   Other Topics Concern   ? Not on file   Social History Narrative   ? Not on file       Social History     Tobacco Use   ? Smoking status: Former Smoker     Packs/day: 1.00     Years: 10.00     Pack years: 10.00     Types: Cigarettes     Start date: 08/04/1967     Quit date: 2008     Years since quitting: 13.5   ? Smokeless tobacco: Former Neurosurgeon   ? Tobacco comment: 15 + yrs since last smoked   Substance Use Topics   ? Alcohol use: Not Currently     Alcohol/week: 2.0 standard drinks     Types: 2 Cans of beer per week     Comment: Social drinker        Review of Systems   Constitution: Negative for chills, diaphoresis, fever, malaise/fatigue, night sweats, weight gain and weight loss.   HENT: Negative for sore throat and stridor.    Eyes: Negative for blurred vision.   Cardiovascular: Negative for chest pain, orthopnea, palpitations and syncope.   Respiratory: Negative for cough, shortness of breath, snoring and wheezing.    Skin: Negative for rash.   Musculoskeletal: Positive for back pain. Negative for joint pain.   Gastrointestinal: Negative for abdominal pain, diarrhea, nausea and vomiting.   Genitourinary: Negative for dysuria, flank pain  and hesitancy.   Neurological: Negative for disturbances in coordination, excessive daytime sleepiness, dizziness, focal weakness, headaches, numbness, seizures and weakness. Psychiatric/Behavioral: Negative for depression, hallucinations and memory loss. The patient is not nervous/anxious.    Allergic/Immunologic: Negative for hives.   All other systems reviewed and are negative.      Objective:     ? ascorbic acid (VITAMIN C) 500 mg tablet Take 500 mg by mouth daily.   ? aspirin EC 81 mg tablet Take 81 mg by mouth daily. Take with food.   ? Celecoxib 50 mg cap TAKE 1 CAPSULE BY MOUTH TWICE A DAY   ? cetirizine (ZYRTEC) 10 mg tablet Take 10 mg by mouth.   ? citalopram (CELEXA) 40 mg tablet Take one tablet by mouth daily.   ? cyclobenzaprine (FLEXERIL) 5 mg tablet Take 1-2 tablets by mouth daily as needed.   ? fluticasone propionate (FLONASE) 50 mcg/actuation nasal spray, suspension Apply 1 spray to each nostril as directed daily. Shake bottle gently before using.   ? folic acid (FOLVITE) 1 mg tablet TAKE 1 TABLET BY MOUTH EVERY DAY   ? gabapentin (NEURONTIN) 300 mg capsule Take one capsule by mouth every 8 hours.   ? glucosamine(+) 500 mg tab Take 1,500 mg by mouth once.   ? lifitegrast (XIIDRA) 5 % ophthalmic drop Apply one drop to both eyes twice daily.   ? losartan (COZAAR) 25 mg tablet Take one tablet by mouth daily. Indications: pt unsure of doseage   ? rosuvastatin (CRESTOR) 20 mg tablet Take one tablet by mouth daily.   ? zolpidem (AMBIEN) 5 mg tablet Take one tablet by mouth at bedtime as needed for Sleep.     Vitals:    07/23/20 1528   BP: 137/83   BP Source: Arm, Right Upper   Patient Position: Sitting   Pulse: 80   Resp: 16   SpO2: 96%   Weight: 78.9 kg (174 lb)   Height: 175.3 cm (69.02)   PainSc: Three     Body mass index is 25.68 kg/m?Marland Kitchen     Physical Exam  Vitals and nursing note reviewed.   Constitutional:       General: He is not in acute distress.     Appearance: He is well-developed. He is not diaphoretic.   HENT:      Head: Normocephalic and atraumatic.      Nose: Nose normal.   Eyes:      General: No scleral icterus.        Right eye: No discharge.         Left eye: No discharge.      Conjunctiva/sclera: Conjunctivae normal.      Pupils: Pupils are equal, round, and reactive to light.   Neck:      Thyroid: No thyromegaly.      Vascular: No JVD.      Trachea: No tracheal deviation.   Cardiovascular:      Rate and Rhythm: Normal rate and regular rhythm.      Heart sounds: Normal heart sounds. No murmur. No friction rub.   Pulmonary:      Effort: Pulmonary effort is normal. No respiratory distress.      Breath sounds: Normal breath sounds. No wheezing or rales.   Chest:      Chest wall: No tenderness.   Abdominal:      General: There is no distension.      Palpations: Abdomen is soft. There is no mass.  Tenderness: There is no abdominal tenderness. There is no guarding or rebound.   Musculoskeletal:         General: Normal range of motion.      Cervical back: Normal range of motion and neck supple.   Lymphadenopathy:      Cervical: No cervical adenopathy.   Skin:     General: Skin is warm and dry.   Neurological:      Mental Status: He is alert and oriented to person, place, and time.      Cranial Nerves: No cranial nerve deficit.   Psychiatric:         Mood and Affect: Mood normal.         Behavior: Behavior normal.         Thought Content: Thought content normal.         Judgment: Judgment normal.          Assessment and Plan:         Chronic low back pain for 6 weeks:  He went to chiroparcter and took NSAID and did home exercises  With his gastric ulcer, NO NSAID or MOBIC  He is seeing PT at [place]  We referred to PM&R again  He had ablation  He saw spine clinic 05/2020  Doing well    Gastric ulcer:  He is in clinic for low back pain for 6 weeks as above. 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   He was referred by cardiology to GI because of anemia. He did EGD and colonscopy 01/25/2020 and was found to have a non-bleeding gastric ulcer 15mm that was biopsied and is scheduled for repeat EGD in 2 months. Colonscopy was normal. Results of scopes are sent for scanning. I told him to stop all  NSAID and Mobic at this time  He failed oral iron so cardiology ordered IV Iron.   He is scheduled to repeat EGD 05/07/20. I told him to restart Mobic with food if EGD is back to normal.   He had a repeat EGD 05/07/2020 by his outside GI doctor and was reported that gastric ulcer has healed.    Stable. Avoid NSAID    Hypertension:  He is on HCTZ and Losarten  BP Readings from Last 3 Encounters:   07/23/20 137/83   06/19/20 120/89   05/25/20 134/88   continue  He had stress test and echo in 02/2016 that were reported as normal and I reviewed in care Every where  We ordered CT calcium score which he did 08/2019    Coronary calcification:  CT calcium scan 08/2019:  Quantitative Coronary Calcium Score:   Left main: 0   LAD: 114   LCX: 144   RCA: 5 7   Total calcium score: 315   He saw cardiology and had echo and normal stress test 10/2019 and has a follow up 12/2019    Renal mass:  His calcium score was 315. He saw cardiology 12/2019. He had CTA chest which was normal but There was incidental cholelithiasis and a right renal lesion that will require ultrasound for further definition.     He had a normal stress test 11/09/2020  He had abdomen US 01/30/2020 which showed:  Small hypoechoic exophytic right renal nodule most compatible with a   hemorrhagic cyst   Cholelithiasis   Mild bladder wall thickening, nonspecific but most likely secondary to   bladder outlet stenosis or neurogenic bladder  Stable    Hyperlipidemia:  He is on Zocor 20mg  daily but did not take for 30 days. He had cramps with Lipitor.  We increased Zocor to 40mg  daily 07/19/2019  Cardiology switched him to Crestor 10mg  daily  Plan on repeat labs in 3 months  Lab Results   Component Value Date    CHOL 178 03/23/2020    TRIG 81 03/23/2020    HDL 58 03/23/2020    LDL 112 (H) 03/23/2020    VLDL 16 03/23/2020    NONHDLCHOL 120 03/23/2020      Anxiety and Depression:   He had for 20 years  He is on Effexor and Remeron  He is not feeling that this combination is helping  He is homicidal or suicidal but his brother committed suicide in 2013  We got him to taper off his medications over2 weeks and we he started Celexa 20mg  daily after he tapered off  We referred to psychologist  I am referring this patient to the behavior health consultant for the following conditions:  Health Risk Behaviors:  Stress management  Mental Health Conditions:  Depression and Anxiety  We increased Celexa from 20 to 40mg  daily back in 08/2019  He is doing better    History of sleep apnea:  He is not snoring after he lost weight    Weakness:  Normal labs and TSH and B12 level   He is on ketogenic diet    Hearing deficits:  He has hearing aids    Insomnia:  Refill Ambien    Routine health maintenance:    Colonoscopy: 2014, normal  Tdap: 2015  Pneumovax: Prevnar 07/19/2019 and plan on pneumovax in a year  Influenza:09/2018, 10/2019  Eye exam: 2019  Dental exam: 2019  AAA screening: 07/2019  He had COVID vaccine 01/2020    Return to clinic in 6 months. He will do labs in next 2 months    Total of 40 minutes were spent on the same day of the visit including preparing to see the patient, obtaining and/or reviewing separately obtained history, performing a medically appropriate examination and/or evaluation, counseling and educating the patient/family/caregiver, ordering medications, tests, or procedures, referring and communication with other health care professionals, documenting clinical information in the electronic or other health record, independently interpreting results and communicating results to the patient/family/caregiver, and care coordination.   I Counseled patient regarding low back pain, depression and weakness. We discussed safety during COVID19 pandemic. We discussed gastric ulcer.     Orders Placed This Encounter   ? PNEUMOCOCCAL VACCINE 23-VAL   ? zolpidem (AMBIEN) 5 mg tablet     Encounter Medications   Medications   ? zolpidem (AMBIEN) 5 mg tablet     Sig: Take one tablet by mouth at bedtime as needed for Sleep.     Dispense:  30 tablet     Refill:  5     No future appointments.  There are no Patient Instructions on file for this visit.

## 2020-08-26 ENCOUNTER — Encounter: Admit: 2020-08-26 | Discharge: 2020-08-26 | Payer: BC Managed Care – PPO

## 2020-08-26 MED ORDER — CITALOPRAM 40 MG PO TAB
ORAL_TABLET | Freq: Every day | 3 refills | Status: AC
Start: 2020-08-26 — End: ?

## 2020-08-26 NOTE — Telephone Encounter
Received E-Rx request from patients pharmacy for citalopram (CELEXA) 40 mg tablet, this is a standing order, per protocol, refill approved.     LOV: 07/23/2020   NOV:   Future Appointments   Date Time Provider Department Center   01/28/2021  3:00 PM Al-Hihi, Roderic Scarce, MD [facility]          Jackolyn Confer, LPN

## 2020-08-29 ENCOUNTER — Encounter: Admit: 2020-08-29 | Discharge: 2020-08-29 | Payer: BC Managed Care – PPO

## 2020-08-29 MED ORDER — LOSARTAN 25 MG PO TAB
ORAL_TABLET | Freq: Every day | ORAL | 1 refills | 90.00000 days | Status: AC
Start: 2020-08-29 — End: ?

## 2020-08-29 NOTE — Telephone Encounter
RN called patient regarding appointment scheduling. Appointment scheduled for 09/18/20 at 2:20pm. 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 RN verbalized that she would let the Administrative Assistant know and we will plan to follow up with him as scheduled. Patient confirmed appointment date/time/location.

## 2020-08-29 NOTE — Telephone Encounter
Med Refill Request received.    Patient last seen 07/23/20 with plan to continue Losarten.    Follow up appt scheduled for 01/28/21.    3 months and 1 refill e-scribed per protocol.

## 2020-08-30 ENCOUNTER — Encounter: Admit: 2020-08-30 | Discharge: 2020-08-30 | Payer: BC Managed Care – PPO

## 2020-08-31 ENCOUNTER — Encounter: Admit: 2020-08-31 | Discharge: 2020-08-31 | Payer: BC Managed Care – PPO

## 2020-08-31 DIAGNOSIS — M25511 Pain in right shoulder: Secondary | ICD-10-CM

## 2020-09-04 ENCOUNTER — Encounter: Admit: 2020-09-04 | Discharge: 2020-09-04 | Payer: BC Managed Care – PPO

## 2020-09-04 MED ORDER — GABAPENTIN 300 MG PO CAP
ORAL_CAPSULE | Freq: Three times a day (TID) | 1 refills
Start: 2020-09-04 — End: ?

## 2020-09-18 ENCOUNTER — Encounter: Admit: 2020-09-18 | Discharge: 2020-09-18 | Payer: BC Managed Care – PPO

## 2020-10-15 ENCOUNTER — Encounter: Admit: 2020-10-15 | Discharge: 2020-10-15 | Payer: BC Managed Care – PPO

## 2021-01-01 NOTE — Telephone Encounter
Refill Request received from patient's [facility].    Patient last seen 07/23/20 with plan to continue Losartan 25mg  tablet daily.    Follow up appt scheduled for 01/28/21.    3 months and 1 refills e-scribed per protocol.    03/28/21, RN

## 2021-01-18 ENCOUNTER — Encounter: Admit: 2021-01-18 | Discharge: 2021-01-18 | Payer: BC Managed Care – PPO

## 2021-01-23 ENCOUNTER — Encounter: Admit: 2021-01-23 | Discharge: 2021-01-23 | Payer: BC Managed Care – PPO

## 2021-01-26 ENCOUNTER — Ambulatory Visit: Admit: 2021-01-26 | Discharge: 2021-01-26 | Payer: BC Managed Care – PPO

## 2021-01-26 ENCOUNTER — Encounter: Admit: 2021-01-26 | Discharge: 2021-01-26 | Payer: BC Managed Care – PPO

## 2021-01-26 DIAGNOSIS — E782 Mixed hyperlipidemia: Secondary | ICD-10-CM

## 2021-01-26 DIAGNOSIS — I10 Essential (primary) hypertension: Secondary | ICD-10-CM

## 2021-01-26 DIAGNOSIS — F419 Anxiety disorder, unspecified: Secondary | ICD-10-CM

## 2021-01-26 DIAGNOSIS — Z125 Encounter for screening for malignant neoplasm of prostate: Secondary | ICD-10-CM

## 2021-01-26 DIAGNOSIS — D649 Anemia, unspecified: Secondary | ICD-10-CM

## 2021-01-26 DIAGNOSIS — K257 Chronic gastric ulcer without hemorrhage or perforation: Secondary | ICD-10-CM

## 2021-01-26 LAB — CBC AND DIFF
Lab: 0 K/UL (ref 0–0.20)
Lab: 0.4 K/UL (ref 0–0.45)
Lab: 0.6 K/UL (ref 0–0.80)
Lab: 1 % (ref 0–2)
Lab: 1.3 K/UL (ref 1.0–4.8)
Lab: 13 % (ref 11–15)
Lab: 13 % — ABNORMAL HIGH (ref 4–12)
Lab: 14 g/dL (ref 13.5–16.5)
Lab: 2.4 K/UL (ref 1.8–7.0)
Lab: 247 K/UL (ref 150–400)
Lab: 27 % (ref 24–44)
Lab: 31 pg (ref 26–34)
Lab: 34 g/dL (ref 32.0–36.0)
Lab: 4.4 M/UL (ref 4.4–5.5)
Lab: 4.9 K/UL (ref 4.5–11.0)
Lab: 41 % (ref 40–50)
Lab: 51 % (ref 41–77)
Lab: 6.6 FL — ABNORMAL LOW (ref >60)
Lab: 8 % — ABNORMAL HIGH (ref 0–5)
Lab: 92 FL (ref 80–100)

## 2021-01-26 LAB — IRON + BINDING CAPACITY + %SAT+ FERRITIN
Lab: 109 ug/dL (ref 50–185)
Lab: 429 ug/dL — ABNORMAL HIGH (ref 270–380)

## 2021-01-26 LAB — LIPID PROFILE
Lab: 104 mg/dL — ABNORMAL HIGH (ref <100)
Lab: 109 mg/dL (ref <150)
Lab: 170 mg/dL (ref <200)

## 2021-01-26 LAB — PSA SCREEN: Lab: 0.2 ng/mL — ABNORMAL HIGH (ref <4.01)

## 2021-01-26 LAB — COMPREHENSIVE METABOLIC PANEL
Lab: 140 MMOL/L — ABNORMAL LOW (ref 137–147)
Lab: 5.1 MMOL/L (ref 3.5–5.1)

## 2021-01-29 ENCOUNTER — Encounter: Admit: 2021-01-29 | Discharge: 2021-01-29 | Payer: BC Managed Care – PPO

## 2021-01-29 MED ORDER — EZETIMIBE 10 MG PO TAB
10 mg | ORAL_TABLET | Freq: Every day | ORAL | 1 refills | Status: AC
Start: 2021-01-29 — End: ?

## 2021-02-13 NOTE — Progress Notes
Pt hospitalized since last office visit: No    Health Maintenance Due   Topic Date Due    HEPATITIS C SCREENING  Never done    SHINGLES RECOMBINANT VACCINE (1 of 2) Never done    COVID-19 VACCINE (3 - Booster for Moderna series) 06/17/2020    INFLUENZA VACCINE  07/22/2020       There are no diagnoses linked to this encounter.    Labs not done:      Lab Frequency Next Occurrence   [place] [facility] SPINE INJECT SNRB/TFESI LUMBAR/SACRAL         Labs drawn 01/26/21.    Notes to provider:    Answers for HPI/ROS submitted by the patient on 02/12/2021  What medical problem brings you in to see the provider?: Semi-Aunnal Office visit and review  of Labs.  Abdominal pain: No  No appetite: No  Joint Pain: Yes  Change in bowel habit: No  Chest pain: No  Chills: No  Congestion: No  Cough: No  Sweating much more than normal: No  Fatigue: Yes  Fever: No  Headaches: Yes  Joint swelling: No  Muscle pain: Yes  Nausea: No  Neck pain: No  Numbness: No  Rash: No  Sore throat: No  Swollen glands: No  Urinary symptoms: No  Vertigo: No  Visual change: No  Vomiting: No  Weakness: No  Which of the following treatments have you tried?: acetaminophen, NSAIDs, position changes, walking  If you've tried a treatment for this problem, how much relief did you experience?: mild

## 2021-02-18 ENCOUNTER — Encounter: Admit: 2021-02-18 | Discharge: 2021-02-18 | Payer: BC Managed Care – PPO

## 2021-02-18 ENCOUNTER — Ambulatory Visit: Admit: 2021-02-18 | Discharge: 2021-02-19 | Payer: BC Managed Care – PPO

## 2021-02-18 DIAGNOSIS — R519 Generalized headaches: Secondary | ICD-10-CM

## 2021-02-18 DIAGNOSIS — E785 Hyperlipidemia, unspecified: Secondary | ICD-10-CM

## 2021-02-18 DIAGNOSIS — M545 Chronic left-sided low back pain without sciatica: Secondary | ICD-10-CM

## 2021-02-18 DIAGNOSIS — Z125 Encounter for screening for malignant neoplasm of prostate: Secondary | ICD-10-CM

## 2021-02-18 DIAGNOSIS — K257 Chronic gastric ulcer without hemorrhage or perforation: Secondary | ICD-10-CM

## 2021-02-18 DIAGNOSIS — I10 Essential (primary) hypertension: Secondary | ICD-10-CM

## 2021-02-18 DIAGNOSIS — M2550 Pain in unspecified joint: Secondary | ICD-10-CM

## 2021-02-18 DIAGNOSIS — M1990 Unspecified osteoarthritis, unspecified site: Secondary | ICD-10-CM

## 2021-02-18 DIAGNOSIS — H269 Unspecified cataract: Secondary | ICD-10-CM

## 2021-02-18 DIAGNOSIS — R7301 Impaired fasting glucose: Secondary | ICD-10-CM

## 2021-02-18 DIAGNOSIS — G4700 Insomnia, unspecified: Secondary | ICD-10-CM

## 2021-02-18 DIAGNOSIS — G4730 Sleep apnea, unspecified: Secondary | ICD-10-CM

## 2021-02-18 DIAGNOSIS — K21 Gastroesophageal reflux disease with esophagitis without hemorrhage: Secondary | ICD-10-CM

## 2021-02-18 DIAGNOSIS — F32A Depression: Secondary | ICD-10-CM

## 2021-02-18 DIAGNOSIS — F419 Anxiety disorder, unspecified: Principal | ICD-10-CM

## 2021-02-18 DIAGNOSIS — M2560 Stiffness of unspecified joint, not elsewhere classified: Secondary | ICD-10-CM

## 2021-02-18 DIAGNOSIS — F5101 Primary insomnia: Secondary | ICD-10-CM

## 2021-02-18 DIAGNOSIS — IMO0002 Ulcer: Secondary | ICD-10-CM

## 2021-02-18 MED ORDER — GABAPENTIN 300 MG PO CAP
300 mg | ORAL_CAPSULE | ORAL | 3 refills | Status: AC
Start: 2021-02-18 — End: ?

## 2021-02-18 MED ORDER — ZOLPIDEM 5 MG PO TAB
5 mg | ORAL_TABLET | Freq: Every evening | ORAL | 5 refills | 30.00000 days | Status: AC | PRN
Start: 2021-02-18 — End: ?

## 2021-02-18 NOTE — Progress Notes
Date of Service: 02/18/2021    Jared Hebert is a 70 y.o. male. DOB: Jan 18, 1951   MRN#: [id number]    Subjective: follow up       Back Pain  This is a chronic problem. The current episode started more than 1 month ago. The problem occurs intermittently. The pain is at a severity of 4/10. The pain is moderate. The pain is worse during the day. The symptoms are aggravated by bending and standing. Pertinent negatives include no abdominal pain, chest pain, dysuria, fever, headaches, numbness, weakness or weight loss. He has tried chiropractic manipulation and NSAIDs for the symptoms. The treatment provided no relief.   Hypertension  This is a chronic problem. The problem is controlled. Pertinent negatives include no anxiety, blurred vision, chest pain, headaches, malaise/fatigue, neck pain, orthopnea, palpitations or shortness of breath.   Patient Reported Other  What medical problem brings you in to see the provider? Semi-Aunnal Office visit and review  of Labs..  Associated symptoms include arthralgias, fatigue and myalgias. Pertinent negatives include no abdominal pain, anorexia, change in bowel habit, chest pain, chills, congestion, coughing, diaphoresis, fever, headaches, joint swelling, nausea, neck pain, numbness, rash, sore throat, swollen glands, urinary symptoms, vertigo, visual change, vomiting or weakness. He has tried acetaminophen, NSAIDs, position changes and walking for the symptoms. The treatment provided mild relief.     [name] is 69 y.o. patient who presents to clinic for follow up. He established care 07/19/2019. He is a Engineer, civil (consulting) in [place]. No smoking, no alcohol, no street drugs. No regular exercise. He has 4 children.     He was last seen 07/2020    He was in clinic for low back pain for 6 weeks as above. 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   He was referred by cardiology to GI because of anemia. He did EGD and colonscopy 01/25/2020 and was found to have a non-bleeding gastric ulcer 15mm that was biopsied and is scheduled for repeat EGD in 2 months. Colonscopy was normal. Results of scopes are sent for scanning. I told him to stop all  NSAID and Mobic at this time. He had a repeat EGD 05/07/2020 by his outside GI doctor and was reported that gastric ulcer has healed.      His calcium score was 315. He saw cardiology 12/2019. He had CTA chest which was normal but There was incidental cholelithiasis and a right renal lesion that will require ultrasound for further definition.     He had a normal stress test 11/09/2020  He failed oral iron so cardiology ordered IV Iron.     He had labs drawn 01/26/21    He had abdomen US 01/30/2020 which showed:  Small hypoechoic exophytic right renal nodule most compatible with a   hemorrhagic cyst   Cholelithiasis   Mild bladder wall thickening, nonspecific but most likely secondary to   bladder outlet stenosis or neurogenic bladder     He managed to lose intentionally 55 Lb over last 1 year by having ketogenic diet. He takes multivitamin daily.    He also struggles with depression but no intent to harm himself or others. His brother had depression and committed suicide in 2003.       Medical History:   Diagnosis Date   ? Arthritis     back & shoulders   ? Cataract 5 Years ago   ? Depression 20+ Yrs   ?  Generalized headaches Most of adult life   ? Hyperlipidemia    ? Hypertension    ? Joint pain 10 yrs ago   ? Joint stiffness 2009   ? Sleep apnea    ? Ulcer 12/2019       Surgical History:   Procedure Laterality Date   ? VASECTOMY  1984   ? HX CATARACT REMOVAL Bilateral 02/2017    [facility]   ? MENISCECTOMY Left 2019   ? PARS PLANA MECHANICAL VITRECTOMY WITH  [place]olog Right 09/02/2018    Performed by Blair Hailey, MD at [facility]   ? PARS PLANA MECHANICAL VITRECTOMY Left 11/04/2018    Performed by Blair Hailey[facility] MD at [facility]   ? HX ADENOIDECTOMY     ? HX ARTHROSCOPIC SURGERY  4 yrs ago    Left Knee Scope   ? HX TONSILLECTOMY ? KNEE SURGERY  Left Knee Scope 4 yrs ago   ? NASAL SURGERY      TWICE       family history includes Arthritis in his father and mother; Back pain in his brother, father, and mother; Cataract in his father and mother; Depression in his brother; Heart Disease in his father; Heart problem in his father; Hypertension in his father; Joint Pain in his brother, father, and mother; Osteoporosis in his mother.    Social History     Socioeconomic History   ? Marital status: Married     Spouse name: Not on file   ? Number of children: Not on file   ? Years of education: Not on file   ? Highest education level: Not on file   Occupational History   ? Not on file   Tobacco Use   ? Smoking status: Former Smoker     Packs/day: 1.00     Years: 10.00     Pack years: 10.00     Types: Cigarettes     Start date: 08/04/1967     Quit date: 2008     Years since quitting: 14.1   ? Smokeless tobacco: Former Neurosurgeon   ? Tobacco comment: 15 + yrs since last smoked   Vaping Use   ? Vaping Use: Never used   Substance and Sexual Activity   ? Alcohol use: Not Currently     Alcohol/week: 2.0 standard drinks     Types: 2 Cans of beer per week     Comment: Social drinker   ? Drug use: Never   ? Sexual activity: Yes     Partners: Female     Birth control/protection: None   Other Topics Concern   ? Not on file   Social History Narrative   ? Not on file       Social History     Tobacco Use   ? Smoking status: Former Smoker     Packs/day: 1.00     Years: 10.00     Pack years: 10.00     Types: Cigarettes     Start date: 08/04/1967     Quit date: 2008     Years since quitting: 14.1   ? Smokeless tobacco: Former Neurosurgeon   ? Tobacco comment: 15 + yrs since last smoked   Substance Use Topics   ? Alcohol use: Not Currently     Alcohol/week: 2.0 standard drinks     Types: 2 Cans of beer per week     Comment: Social drinker        Review  of Systems   Constitutional: Positive for fatigue. Negative for chills, diaphoresis, fever, malaise/fatigue, night sweats, weight gain and weight loss.   HENT: Negative for congestion, sore throat and stridor.    Eyes: Negative for blurred vision.   Cardiovascular: Negative for chest pain, orthopnea, palpitations and syncope.   Respiratory: Negative for cough, shortness of breath, snoring and wheezing.    Skin: Negative for rash.   Musculoskeletal: Positive for arthralgias, back pain and myalgias. Negative for joint pain, joint swelling and neck pain.   Gastrointestinal: Negative for abdominal pain, anorexia, change in bowel habit, diarrhea, nausea and vomiting.   Genitourinary: Negative for dysuria, flank pain and hesitancy.   Neurological: Negative for disturbances in coordination, excessive daytime sleepiness, dizziness, focal weakness, headaches, numbness, seizures, vertigo and weakness.   Psychiatric/Behavioral: Negative for depression, hallucinations and memory loss. The patient is not nervous/anxious.    Allergic/Immunologic: Negative for hives.   All other systems reviewed and are negative.      Objective:     ? ascorbic acid (VITAMIN C) 500 mg tablet Take 500 mg by mouth daily.   ? aspirin EC 81 mg tablet Take 81 mg by mouth daily. Take with food.   ? Celecoxib 50 mg cap TAKE 1 CAPSULE BY MOUTH TWICE A DAY   ? cetirizine (ZYRTEC) 10 mg tablet Take 10 mg by mouth.   ? citalopram (CELEXA) 40 mg tablet TAKE 1 TABLET BY MOUTH EVERY DAY   ? cyclobenzaprine (FLEXERIL) 5 mg tablet Take 1-2 tablets by mouth daily as needed.   ? ezetimibe (ZETIA) 10 mg tablet Take one tablet by mouth daily.   ? fluticasone propionate (FLONASE) 50 mcg/actuation nasal spray, suspension Apply 1 spray to each nostril as directed daily. Shake bottle gently before using.   ? folic acid (FOLVITE) 1 mg tablet TAKE 1 TABLET BY MOUTH EVERY DAY   ? gabapentin (NEURONTIN) 300 mg capsule Take one capsule by mouth every 8 hours.   ? glucosamine(+) 500 mg tab Take 1,500 mg by mouth once.   ? lifitegrast (XIIDRA) 5 % ophthalmic drop Apply one drop to both eyes twice daily. ? losartan (COZAAR) 25 mg tablet TAKE 1 TABLET BY MOUTH EVERY DAY   ? meloxicam (MOBIC) 15 mg tablet Take one tablet by mouth daily.   ? rosuvastatin (CRESTOR) 20 mg tablet Take one tablet by mouth daily.   ? zolpidem (AMBIEN) 5 mg tablet Take one tablet by mouth at bedtime as needed for Sleep.     Vitals:    02/18/21 1420   BP: 117/75   BP Source: Arm, Left Upper   Patient Position: Sitting   Pulse: 76   Resp: 16   Temp: 36.5 ?C (97.7 ?F)   TempSrc: Temporal   SpO2: 97%   Weight: 84.2 kg (185 lb 9.6 oz)   Height: 175.3 cm (5' 9.02)   PainSc: Three     Body mass index is 27.4 kg/m?Marland Kitchen     Physical Exam  Vitals and nursing note reviewed.   Constitutional:       General: He is not in acute distress.     Appearance: He is well-developed. He is not diaphoretic.   HENT:      Head: Normocephalic and atraumatic.      Nose: Nose normal.   Eyes:      General: No scleral icterus.        Right eye: No discharge.         Left eye: No discharge.  Conjunctiva/sclera: Conjunctivae normal.      Pupils: Pupils are equal, round, and reactive to light.   Neck:      Thyroid: No thyromegaly.      Vascular: No JVD.      Trachea: No tracheal deviation.   Cardiovascular:      Rate and Rhythm: Normal rate and regular rhythm.      Heart sounds: Normal heart sounds. No murmur heard.    No friction rub.   Pulmonary:      Effort: Pulmonary effort is normal. No respiratory distress.      Breath sounds: Normal breath sounds. No wheezing or rales.   Chest:      Chest wall: No tenderness.   Abdominal:      General: There is no distension.      Palpations: Abdomen is soft. There is no mass.      Tenderness: There is no abdominal tenderness. There is no guarding or rebound.   Musculoskeletal:         General: Normal range of motion.      Cervical back: Normal range of motion and neck supple.   Lymphadenopathy:      Cervical: No cervical adenopathy.   Skin:     General: Skin is warm and dry.   Neurological:      Mental Status: He is alert and oriented to person, place, and time.      Cranial Nerves: No cranial nerve deficit.   Psychiatric:         Mood and Affect: Mood normal.         Behavior: Behavior normal.         Thought Content: Thought content normal.         Judgment: Judgment normal.          Assessment and Plan:      Problem   Gerd (Gastroesophageal Reflux Disease)   Essential Hypertension (Resolved)   Hyperlipidemia (Resolved)     Chronic low back pain for few weeks:  He went to chiroparcter and took NSAID and did home exercises  With his gastric ulcer, NO NSAID or MOBIC  He is seeing PT at [facility]  We referred to PM&R again  He had ablation  He saw spine clinic 05/2020  Doing well    Rt shoulder injury at work:  He had surgery 11/2020  He is getting PT    Gastric ulcer:  He is in clinic for low back pain for 6 weeks as above. 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   He was referred by cardiology to GI because of anemia. He did EGD and colonscopy 01/25/2020 and was found to have a non-bleeding gastric ulcer 15mm that was biopsied and is scheduled for repeat EGD in 2 months. Colonscopy was normal. Results of scopes are sent for scanning. I told him to stop all  NSAID and Mobic at this time  He failed oral iron so cardiology ordered IV Iron.   He is scheduled to repeat EGD 05/07/20. I told him to restart Mobic with food if EGD is back to normal.   He had a repeat EGD 05/07/2020 by his outside GI doctor and was reported that gastric ulcer has healed.    Stable. Avoid NSAID    Hypertension:  He is on HCTZ and Losarten  BP Readings from Last 3 Encounters:   02/18/21 117/75   07/23/20 137/83   06/19/20 120/89  continue  He had stress test and echo in 02/2016 that were reported as normal and I reviewed in care Every where  We ordered CT calcium score which he did 08/2019    Coronary calcification:  CT calcium scan 08/2019:  Quantitative Coronary Calcium Score:   Left main: 0   LAD: 114   LCX: 144   RCA: 5 7   Total calcium score: 315   He saw cardiology and had echo and normal stress test 10/2019 and has a follow up 12/2019    Renal mass:  His calcium score was 315. He saw cardiology 12/2019. He had CTA chest which was normal but There was incidental cholelithiasis and a right renal lesion that will require ultrasound for further definition.     He had a normal stress test 11/09/2020  He had abdomen US 01/30/2020 which showed:  Small hypoechoic exophytic right renal nodule most compatible with a   hemorrhagic cyst   Cholelithiasis   Mild bladder wall thickening, nonspecific but most likely secondary to   bladder outlet stenosis or neurogenic bladder   Stable    Hyperlipidemia:  He is on Zocor 20mg  daily but did not take for 30 days. He had cramps with Lipitor.  We increased Zocor to 40mg  daily 07/19/2019  Cardiology switched him to Crestor 10mg  daily  Plan on repeat labs in 3 months  Lab Results   Component Value Date    CHOL 170 01/26/2021    TRIG 109 01/26/2021    HDL 52 01/26/2021    LDL 104 (H) 01/26/2021    VLDL 22 01/26/2021    NONHDLCHOL 118 01/26/2021      Anxiety and Depression:   He had for 20 years  He is on Effexor and Remeron  He is not feeling that this combination is helping  He is homicidal or suicidal but his brother committed suicide in 2013  We got him to taper off his medications over2 weeks and we he started Celexa 20mg  daily after he tapered off  We referred to psychologist  I am referring this patient to the behavior health consultant for the following conditions:  Health Risk Behaviors:  Stress management  Mental Health Conditions:  Depression and Anxiety  We increased Celexa from 20 to 40mg  daily back in 08/2019  He is doing better    History of sleep apnea:  He is not snoring after he lost weight  He struggles with staying sleep and Ambien did not help  Will refer to sleep clinic    Weakness:  Normal labs and TSH and B12 level   He is on ketogenic diet    Hearing deficits:  He has hearing aids    Routine health maintenance:    Colonoscopy: 2014, normal  Tdap: 2015  Pneumovax: Prevnar 07/19/2019 and plan on pneumovax in a year  Influenza:09/2018, 10/2019  Eye exam: 2019  Dental exam: 2019  AAA screening: 07/2019  He had COVID vaccine 10/2020    Return to clinic in 6 months. He will do labs in next 2 months    Total of 40 minutes were spent on the same day of the visit including preparing to see the patient, obtaining and/or reviewing separately obtained history, performing a medically appropriate examination and/or evaluation, counseling and educating the patient/family/caregiver, ordering medications, tests, or procedures, referring and communication with other health care professionals, documenting clinical information in the electronic or other health record, independently interpreting results and communicating results to the patient/family/caregiver, and care  coordination.   I Counseled patient regarding low back pain, depression and weakness. We discussed safety during COVID19 pandemic. We discussed gastric ulcer.     Orders Placed This Encounter   ? CBC AND DIFF   ? BASIC METABOLIC PANEL   ? HEMOGLOBIN A1C   ? LIPID PROFILE   ? LIVER FUNCTION PANEL   ? TSH WITH FREE T4 REFLEX   ? PSA SCREEN today   ? PULMONARY   ? gabapentin (NEURONTIN) 300 mg capsule   ? zolpidem (AMBIEN) 5 mg tablet     Encounter Medications   Medications   ? gabapentin (NEURONTIN) 300 mg capsule     Sig: Take one capsule by mouth every 8 hours.     Dispense:  270 capsule     Refill:  3   ? zolpidem (AMBIEN) 5 mg tablet     Sig: Take one tablet by mouth at bedtime as needed for Sleep.     Dispense:  30 tablet     Refill:  5     No future appointments.  Patient Instructions   Sleeping Hygiene: The goal is to rediscover how to sleep naturally.  Your Personal Habits:   Fix a bedtime and an awakening time. Do not be one of those patients who allows bedtime and awakening time to drift. The body gets used to falling asleep a a certain time, but only if this is relatively fixed. Even if you are retired or not working, this is an essential component of good sleeping habits.   Avoid napping during the day. If you nap throughout the day, it is no wonder that you will not be able to sleep at night. The late afternoon for most people is a sleepy time. Many people will take a nap at that time. This is generally not a bad thing to do, provided you limit the nap to 30-45 minutes and can fall sleep well at night.   Avoid alcohol 4-6 hours before bedtime. Many people believe that alcohol helps them sleep. While alcohol has an immediate sleep-inducing effect, a few hours later as the alcohol levels in your blood start to fall, there is a stimulant or wake-up effect.   Avoid caffeine 4-6 hours before bedtime. This includes caffeinated beverages such as coffee, tea and many sodas, as well as chocolate, so be careful.   Avoid heavy, spicy, or sugary food 4-6 hours before bedtime. These can affect your ability to stay asleep.   Exercise regularly, but not right before bed. Regular exercise, particularly in the afternoon, can help deepen sleep. Strenuous exercise within the 2 hours before bedtime, however, can decrease your ability to fall asleep.   Your Sleeping Environment:   Use comfortable bedding. Uncomfortable bedding can prevent good sleep. Evaluate whether or not this is a source of your problem, and make appropriate changes.   Find a comfortable temperature setting for sleeping and keep the room well ventilated. If your bedroom is too cold or too hot, it can keep you awake. A cool (not cold) bedroom is often most conducive to sleep.   Block out all distracting noise, and eliminate as much light as possible.   Reserve the bed for sleep and sex. Don't use the bed as an office, workroom or recreation room. Let your body know that the bed is associated with sleeping.   Getting Ready For Bed:   Try a light snack before bed. Warm milk and foods high in the amino acid tryptophan, such as  bananas, may help you to sleep.   Practice relaxation techniques before bed. Relaxation techniques such as yoga, deep breathing and others may help relieve anxiety and reduce muscle tension.   Don't take your worries to bed. Leave your worries about job, scool, daily life, etc., behind when you go to bed. Some people find it useful to assign a worry period during he evening or late afternoon to deal with these issues.   Establish a pre-sleep ritual. Pre-sleep rituals, such as a warm bath or a few minutes of reading, can help you sleep.   Getting into your favorite sleeping position. If you don't fall asleep within 15-30 minutes, get up, go into another room, and read until sleepy.   Getting Up in the Middle of the Night:   Most people wake up one or two times a night for various reasons. If you find that you get up i nthe middle of the night and cannot get back to sleep within 15-20 minutes, then do not remain in bed trying hard to sleep. Get out of bed. Leave the bedroom. Read, have a light snack, do some quite activity, or take a bath. You will generally find that you can get back to sleep 20 minutes or so later. Do not perform challenging or engaging activity such as office work, housework, Catering manager. Do not watch television.   A Word About Television:   Many people fall asleep with the television on in their room. Watching television before bedtime is often a bad idea. Television is a very engaging medium that tends to keep people up. We generally recommend that the television not be on in the bedroom. At the appropriate bedtime, the TV should be turned off and the patient should go to sleep. Some people find that the radio helps them go to sleep. Since radio is a less engaging medium than TV, this is probably a good idea.   Other Factors:   Serveral physical factors are known to upset sleep. These include arthritis, acid reflux with heartburn, menstruation, headaches and hot flashes. Psychological and mental health problems like depression, anxiety and stress are often associated with sleeping difficulty. In many cases, difficulty staying asleep may be the only presenting sign of depression. A physician should be consulted about these issues to help determine the problem and the best treatment.   Many medications can cause sleeplessness as a side effect. Ask your doctor or pharmacist if medications you are taking can lead to sleeplessness.   To help overall improvement in sleep patterns, your doctor may prescribe sleep medications for short-term relief of a sleep problem. The decision to take sleeping aids is a medical one to be made in the context of your overall health picture.     ===================    Get fasting labs few days before return to clinic in 6 months

## 2021-02-19 DIAGNOSIS — G4733 Obstructive sleep apnea (adult) (pediatric): Secondary | ICD-10-CM

## 2021-02-19 DIAGNOSIS — F32A Depression, unspecified: Secondary | ICD-10-CM

## 2021-02-19 DIAGNOSIS — E782 Mixed hyperlipidemia: Secondary | ICD-10-CM

## 2021-02-19 DIAGNOSIS — I10 Essential (primary) hypertension: Secondary | ICD-10-CM

## 2021-03-26 ENCOUNTER — Encounter: Admit: 2021-03-26 | Discharge: 2021-03-26 | Payer: BC Managed Care – PPO

## 2021-04-03 ENCOUNTER — Encounter: Admit: 2021-04-03 | Discharge: 2021-04-03 | Payer: BC Managed Care – PPO

## 2021-04-03 MED ORDER — ROSUVASTATIN 20 MG PO TAB
ORAL_TABLET | Freq: Every day | ORAL | 1 refills | 90.00000 days | Status: AC
Start: 2021-04-03 — End: ?

## 2021-05-04 ENCOUNTER — Encounter: Admit: 2021-05-04 | Discharge: 2021-05-04 | Payer: BC Managed Care – PPO

## 2021-05-04 MED ORDER — FOLIC ACID 1 MG PO TAB
ORAL_TABLET | Freq: Every day | 3 refills
Start: 2021-05-04 — End: ?

## 2021-05-28 ENCOUNTER — Encounter: Admit: 2021-05-28 | Discharge: 2021-05-28 | Payer: BC Managed Care – PPO

## 2021-05-28 ENCOUNTER — Ambulatory Visit: Admit: 2021-05-28 | Discharge: 2021-05-29 | Payer: BC Managed Care – PPO

## 2021-05-28 DIAGNOSIS — G4730 Sleep apnea, unspecified: Secondary | ICD-10-CM

## 2021-05-28 DIAGNOSIS — F32A Depression: Secondary | ICD-10-CM

## 2021-05-28 DIAGNOSIS — M2550 Pain in unspecified joint: Secondary | ICD-10-CM

## 2021-05-28 DIAGNOSIS — H269 Unspecified cataract: Secondary | ICD-10-CM

## 2021-05-28 DIAGNOSIS — I10 Essential (primary) hypertension: Secondary | ICD-10-CM

## 2021-05-28 DIAGNOSIS — R519 Generalized headaches: Secondary | ICD-10-CM

## 2021-05-28 DIAGNOSIS — M2560 Stiffness of unspecified joint, not elsewhere classified: Secondary | ICD-10-CM

## 2021-05-28 DIAGNOSIS — IMO0002 Ulcer: Secondary | ICD-10-CM

## 2021-05-28 DIAGNOSIS — E785 Hyperlipidemia, unspecified: Secondary | ICD-10-CM

## 2021-05-28 DIAGNOSIS — M1990 Unspecified osteoarthritis, unspecified site: Secondary | ICD-10-CM

## 2021-05-28 NOTE — Patient Instructions
My nurse is Sarah Orwa, RN. She can be reached at [phone].    Please contact my nurse with any questions regarding your appointment.    If you need to schedule or reschedule an appointment, please contact [phone].    For refills on medications, please have your pharmacy fax a refill authorization request form to our office at [phone]. Please allow at least 3 business days for refill requests.    For urgent issues after business hours/weekends/holidays call [phone] and request for the pulmonary fellow to be paged.

## 2021-05-29 DIAGNOSIS — G4700 Insomnia, unspecified: Secondary | ICD-10-CM

## 2021-05-29 DIAGNOSIS — F419 Anxiety disorder, unspecified: Secondary | ICD-10-CM

## 2021-05-29 DIAGNOSIS — I10 Essential (primary) hypertension: Secondary | ICD-10-CM

## 2021-05-29 DIAGNOSIS — F32A Depression, unspecified: Secondary | ICD-10-CM

## 2021-05-29 DIAGNOSIS — E782 Mixed hyperlipidemia: Secondary | ICD-10-CM

## 2021-05-29 DIAGNOSIS — G4733 Obstructive sleep apnea (adult) (pediatric): Principal | ICD-10-CM

## 2021-06-04 ENCOUNTER — Encounter: Admit: 2021-06-04 | Discharge: 2021-06-04 | Payer: BC Managed Care – PPO

## 2021-06-04 MED ORDER — EZETIMIBE 10 MG PO TAB
ORAL_TABLET | Freq: Every day | 1 refills
Start: 2021-06-04 — End: ?

## 2021-06-04 MED ORDER — LOSARTAN 25 MG PO TAB
ORAL_TABLET | Freq: Every day | 1 refills
Start: 2021-06-04 — End: ?

## 2021-08-06 ENCOUNTER — Encounter: Admit: 2021-08-06 | Discharge: 2021-08-06 | Payer: BC Managed Care – PPO

## 2021-08-06 MED ORDER — CITALOPRAM 40 MG PO TAB
ORAL_TABLET | Freq: Every day | 0 refills | Status: AC
Start: 2021-08-06 — End: ?

## 2021-08-06 MED ORDER — MELOXICAM 15 MG PO TAB
ORAL_TABLET | Freq: Every day | ORAL | 0 refills | 30.00000 days | Status: AC
Start: 2021-08-06 — End: ?

## 2021-08-06 NOTE — Telephone Encounter
Some refill protocol elements NOT Met  Medication name: Celexa; Mobic  Medication Strength: 63m; 188m   Last Fill Date: 07/13/21; 06/11/21    Off protocol and Non-delegated medication    Routed to Provider

## 2021-09-12 ENCOUNTER — Encounter: Admit: 2021-09-12 | Discharge: 2021-09-12 | Payer: BC Managed Care – PPO

## 2021-09-12 NOTE — Progress Notes
Pt hospitalized since last office visit: No    Health Maintenance Due   Topic Date Due    HEPATITIS C SCREENING  Never done    SHINGLES RECOMBINANT VACCINE (1 of 2) Never done    COVID-19 VACCINE (4 - Booster for Moderna series) 01/02/2021    PHYSICAL (COMPREHENSIVE) EXAM  07/23/2021       There are no diagnoses linked to this encounter.    Labs not done:      Lab Frequency Next Occurrence   CBC AND DIFF Once 06/18/2021   BASIC METABOLIC PANEL Once 06/18/2021   HEMOGLOBIN A1C Once 06/18/2021   LIPID PROFILE Once 06/18/2021   LIVER FUNCTION PANEL Once 06/18/2021   TSH WITH FREE T4 REFLEX Once 06/18/2021   PSA SCREEN Once 06/18/2021   REQUEST FOR CARDIOLOGY APPOINTMENT Once 07/03/2021   SLEEP STUDY Once 06/27/2021       MyChart message sent to patient on 09/22/22Angie Jean Rosenthal, RN     Notes to provider:

## 2021-09-16 ENCOUNTER — Encounter: Admit: 2021-09-16 | Discharge: 2021-09-16 | Payer: BC Managed Care – PPO

## 2021-09-16 ENCOUNTER — Ambulatory Visit: Admit: 2021-09-16 | Discharge: 2021-09-16 | Payer: BC Managed Care – PPO

## 2021-09-16 DIAGNOSIS — M1990 Unspecified osteoarthritis, unspecified site: Secondary | ICD-10-CM

## 2021-09-16 DIAGNOSIS — Z0000 Encounter for general adult medical examination without abnormal findings: Secondary | ICD-10-CM

## 2021-09-16 DIAGNOSIS — M2560 Stiffness of unspecified joint, not elsewhere classified: Secondary | ICD-10-CM

## 2021-09-16 DIAGNOSIS — G4730 Sleep apnea, unspecified: Secondary | ICD-10-CM

## 2021-09-16 DIAGNOSIS — K21 Gastroesophageal reflux disease with esophagitis without hemorrhage: Secondary | ICD-10-CM

## 2021-09-16 DIAGNOSIS — R931 Abnormal findings on diagnostic imaging of heart and coronary circulation: Secondary | ICD-10-CM

## 2021-09-16 DIAGNOSIS — Z136 Encounter for screening for cardiovascular disorders: Secondary | ICD-10-CM

## 2021-09-16 DIAGNOSIS — R519 Generalized headaches: Secondary | ICD-10-CM

## 2021-09-16 DIAGNOSIS — E782 Mixed hyperlipidemia: Secondary | ICD-10-CM

## 2021-09-16 DIAGNOSIS — R7301 Impaired fasting glucose: Secondary | ICD-10-CM

## 2021-09-16 DIAGNOSIS — Z125 Encounter for screening for malignant neoplasm of prostate: Secondary | ICD-10-CM

## 2021-09-16 DIAGNOSIS — H269 Unspecified cataract: Secondary | ICD-10-CM

## 2021-09-16 DIAGNOSIS — IMO0002 Ulcer: Secondary | ICD-10-CM

## 2021-09-16 DIAGNOSIS — F5101 Primary insomnia: Secondary | ICD-10-CM

## 2021-09-16 DIAGNOSIS — I10 Essential (primary) hypertension: Secondary | ICD-10-CM

## 2021-09-16 DIAGNOSIS — F32A Depression: Secondary | ICD-10-CM

## 2021-09-16 DIAGNOSIS — E785 Hyperlipidemia, unspecified: Secondary | ICD-10-CM

## 2021-09-16 DIAGNOSIS — M2550 Pain in unspecified joint: Secondary | ICD-10-CM

## 2021-09-16 DIAGNOSIS — R911 Solitary pulmonary nodule: Secondary | ICD-10-CM

## 2021-09-16 DIAGNOSIS — F419 Anxiety disorder, unspecified: Secondary | ICD-10-CM

## 2021-09-16 LAB — HEMOGLOBIN A1C: HEMOGLOBIN A1C: 5.5 % (ref 4.0–6.0)

## 2021-09-16 LAB — COMPREHENSIVE METABOLIC PANEL
ALBUMIN: 4.4 g/dL (ref 3.5–5.0)
ALK PHOSPHATASE: 71 U/L (ref 25–110)
ALT: 23 U/L (ref 7–56)
ANION GAP: 9 K/UL (ref 3–12)
AST: 20 U/L — ABNORMAL HIGH (ref 7–40)
BLD UREA NITROGEN: 21 mg/dL (ref 7–25)
CALCIUM: 9.1 mg/dL (ref 8.5–10.6)
CHLORIDE: 106 MMOL/L (ref 98–110)
CO2: 27 MMOL/L (ref 21–30)
CREATININE: 0.7 mg/dL (ref 0.4–1.24)
EGFR: >60 mL/min (ref >60)
GLUCOSE,PANEL: 100 mg/dL (ref 70–100)
POTASSIUM: 4.3 MMOL/L (ref 3.5–5.1)
SODIUM: 142 MMOL/L (ref 137–147)
TOTAL BILIRUBIN: 0.5 mg/dL (ref 0.3–1.2)
TOTAL PROTEIN: 6.8 g/dL — ABNORMAL LOW (ref 6.0–8.0)

## 2021-09-16 LAB — LIPID PROFILE
CHOLESTEROL: 99 mg/dL (ref <200)
HDL: 46 mg/dL (ref >40)
LDL: 39 mg/dL (ref <100)
NON HDL CHOLESTEROL: 53 mg/dL
TRIGLYCERIDES: 91 mg/dL (ref <150)
VLDL: 18 mg/dL

## 2021-09-16 LAB — CBC AND DIFF
ABSOLUTE EOS COUNT: 0.3 K/UL (ref 0–0.45)
RBC COUNT: 4.4 M/UL (ref 4.4–5.5)
WBC COUNT: 4.4 K/UL — ABNORMAL LOW (ref 4.5–11.0)

## 2021-09-16 LAB — TSH WITH FREE T4 REFLEX: TSH: 1.7 uU/mL (ref 0.35–5.00)

## 2021-09-16 LAB — PSA SCREEN: PSA SCREEN: 0.3 ng/mL — AB (ref <4.01)

## 2021-09-16 MED ORDER — ZOLPIDEM 5 MG PO TAB
5 mg | ORAL_TABLET | Freq: Every evening | ORAL | 5 refills | 30.00000 days | Status: AC | PRN
Start: 2021-09-16 — End: ?

## 2021-09-16 MED ORDER — MELOXICAM 15 MG PO TAB
15 mg | ORAL_TABLET | Freq: Every day | ORAL | 3 refills | 30.00000 days | Status: AC
Start: 2021-09-16 — End: ?

## 2021-09-16 MED ORDER — LOSARTAN 25 MG PO TAB
25 mg | ORAL_TABLET | Freq: Every day | ORAL | 3 refills | 30.00000 days | Status: AC
Start: 2021-09-16 — End: ?

## 2021-09-16 MED ORDER — CITALOPRAM 40 MG PO TAB
40 mg | ORAL_TABLET | Freq: Every day | ORAL | 3 refills | Status: AC
Start: 2021-09-16 — End: ?

## 2021-09-16 NOTE — Progress Notes
Date of Service: 09/16/2021    Jared Hebert is a 70 y.o. male.       HPI    Jared Hebert comes for cardiac followup.  I saw him about 18 months ago.  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  He has a family history of heart disease, hypertension, hyperlipidemia, previous smoking, and coronary atherosclerosis and coronary calcification with a previous calcium score of 315 a couple of years ago.  He was intolerant to simvastatin.  He has been tolerating Crestor.  He has been on Zetia.  His LP(a) is within normal limits.  Previous cardiac imaging 2 years ago showed no ventricular dysfunction or ischemia.  He has had troubles with his shoulder requiring surgery.  He has not had any typical angina or dyspnea.  He denies fatigue, palpitations, presyncope, syncope, TIA, stroke, and claudication.  There has been no fever, chills, or sweats.  He has not had any COVID infection.  He had previous cardiac calcium score and had some nodules that will need followup CT imaging.  He had a renal lesion with subsequent ultrasound.  This was not thought to be of significance.  He had some incidental cholelithiasis without any symptoms of gallbladder problems in the past couple of years.  He has not required hospitalization.  He has not been smoking.  He continues to work full time.  His weight has been stable.    ([id number])               Vitals:    09/16/21 0933   BP: 124/82   BP Source: Arm, Left Upper   Pulse: 67   SpO2: 97%   PainSc: Zero   Weight: 82.3 kg (181 lb 6.4 oz)   Height: 175.3 cm (5' 9)     Body mass index is 26.79 kg/m?Marland Kitchen     Past Medical History  Patient Active Problem List    Diagnosis Date Noted   ? 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 Iron deficiency anemia 01/03/2020   ? Chronic left-sided low back pain without sciatica 12/20/2019   ? 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 Anxiety and depression 09/05/2019   ? Contact w and exposure to oth viral communicable diseases 08/04/2019   ? Bilateral hearing loss 08/04/2019     Overview:   At age 70     ? GERD (gastroesophageal reflux disease) 08/04/2019   ? Obesity (BMI 30.0-34.9) 08/04/2019   ? OSA (obstructive sleep apnea) 08/04/2019     He has a history of obstructive sleep apnea diagnosed around 2010 and did well on CPAP therapy for around 6 to 7 years.  However he has lost more than 50 pounds since that time and no longer snores at night.     ? Palpitations 08/04/2019   ? Insomnia 08/04/2019     He has a long history of insomnia with mainly difficulty falling asleep.  He was started on Ambien 5 mg about 2020.  He currently takes it about half the nights of the week and does find it helpful.  He denies any side effects.     ? Mixed hyperlipidemia 07/19/2019   ? Hypertension, essential 07/19/2019   ? 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 Dry eye syndrome of both eyes 10/14/2017     0.7 plugs OU 11/26/17     ? Astigmatism of  right eye 03/24/2017   ? Cataract, right eye 03/24/2017   ? Astigmatism of left eye 03/10/2017   ? Cataract, left eye 03/09/2017         Review of Systems   All other systems reviewed and are negative.  Review of systems is documented in the database.    ([id number])        Physical Exam  On examination, he is in no distress.  He is 5 feet 9.  Weight is 181.  BMI is 26.8.  Blood pressure 124/82.  Pulse is regular 68 beats per minute.  Rhythm is sinus.  Saturation is 97%.  There is no cyanosis.  Venous pressure is normal.  There is no edema.  Lungs are clear.  There is no wheeze or rhonchi.  PMI is not felt.  Heart sounds are normal.  I do not hear any gallops, murmurs, or rubs.  There is no hepatomegaly.  There are no abdominal bruits.  Bowel sounds are normal.  There is no icterus.  There is no focal neurologic deficit.  Distal pulses are 2+ bilaterally.  There are no carotid bruits.  He is alert and oriented times 3.  His mood, judgment, and affect are normal.    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  There has been no change.    ([id number])        Cardiovascular Health Factors  Vitals BP Readings from Last 3 Encounters:   09/16/21 124/82   05/28/21 133/76   02/18/21 117/75     Wt Readings from Last 3 Encounters:   09/16/21 82.3 kg (181 lb 6.4 oz)   05/28/21 81.2 kg (179 lb)   02/18/21 84.2 kg (185 lb 9.6 oz)     BMI Readings from Last 3 Encounters:   09/16/21 26.79 kg/m?   05/28/21 26.43 kg/m?   02/18/21 27.40 kg/m?      Smoking Social History     Tobacco Use   Smoking Status Former Smoker   ? Packs/day: 1.00   ? Years: 10.00   ? Pack years: 10.00   ? Types: Cigarettes   ? Start date: 08/04/1967   ? Quit date: 2008   ? Years since quitting: 14.7   Smokeless Tobacco Former User   Tobacco Comment    15 + yrs since last smoked      Lipid Profile Cholesterol   Date Value Ref Range Status   01/26/2021 170 <200 MG/DL Final     HDL   Date Value Ref Range Status   01/26/2021 52 >40 MG/DL Final     LDL   Date Value Ref Range Status   01/26/2021 104 (H) <100 mg/dL Final     Triglycerides   Date Value Ref Range Status   01/26/2021 109 <150 MG/DL Final      Blood Sugar Hemoglobin A1C   Date Value Ref Range Status   07/18/2019 5.2 4.0 - 6.0 % Final     Comment:     The ADA recommends that most patients with type 1 and type 2 diabetes maintain   an A1c level <7%.       Glucose   Date Value Ref Range Status   01/26/2021 100 70 - 100 MG/DL Final   62/95/2841 324 (H) 70 - 100 MG/DL Final   40/09/2724 86 70 - 100 MG/DL Final  Problems Addressed Today  Encounter Diagnoses   Name Primary?   ? 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 Screening for heart disease    ? Pulmonary nodule    ? Mixed hyperlipidemia    ? Hypertension, essential        Assessment and Plan    Jared Hebert has coronary calcification and atherosclerosis without obvious angina or heart failure symptoms.  He needs aggressive risk factor modification.  I would like to check lab work.  I would like for him to have a repeat echo, thallium stress test, and repeat a CT scan to check the nodule.  He has been a smoker in the past.  Further recommendations are pending these study results.  If I can be of further assistance, please do not hesitate to let me know.    ([id number])               Current Medications (including today's revisions)  ? ascorbic acid (VITAMIN C) 500 mg tablet Take 500 mg by mouth daily.   ? aspirin EC 81 mg tablet Take 81 mg by mouth daily. Take with food.   ? Celecoxib 50 mg cap TAKE 1 CAPSULE BY MOUTH TWICE A DAY   ? cetirizine (ZYRTEC) 10 mg tablet Take 10 mg by mouth.   ? citalopram (CELEXA) 40 mg tablet TAKE 1 TABLET BY MOUTH EVERY DAY   ? cyclobenzaprine (FLEXERIL) 5 mg tablet Take 1-2 tablets by mouth daily as needed.   ? ezetimibe (ZETIA) 10 mg tablet TAKE 1 TABLET BY MOUTH DAILY   ? fluticasone propionate (FLONASE) 50 mcg/actuation nasal spray, suspension Apply 1 spray to each nostril as directed daily. Shake bottle gently before using.   ? folic acid (FOLVITE) 1 mg tablet TAKE 1 TABLET BY MOUTH EVERY DAY   ? gabapentin (NEURONTIN) 300 mg capsule Take one capsule by mouth every 8 hours.   ? glucosamine(+) 500 mg tab Take 1,500 mg by mouth once.   ? HYDROCODONE-ACETAMINOPHEN PO Take  by mouth.   ? lifitegrast (XIIDRA) 5 % ophthalmic drop Apply one drop to both eyes twice daily.   ? losartan (COZAAR) 25 mg tablet TAKE 1 TABLET BY MOUTH EVERY DAY   ? meloxicam (MOBIC) 15 mg tablet TAKE 1 TABLET BY MOUTH EVERY DAY   ? rosuvastatin (CRESTOR) 20 mg tablet TAKE 1 TABLET BY MOUTH EVERY DAY   ? tramadol HCl (TRAMADOL PO) Take  by mouth.   ? zolpidem (AMBIEN) 5 mg tablet Take one tablet by mouth at bedtime as needed for Sleep.

## 2021-09-16 NOTE — Patient Instructions
We would like to see you back in the office for a follow up appointment in 1 year.    1) Have lab work drawn today. It is ok if you have eaten today     2) Call Radiology at [phone], Option 1 to schedule your chest CT scan.    3) Schedule an echocardiogram on a future date @ Checkout Desk or by calling [phone] (This is an ultrasound of your heart. Please allow 1 hour for this test. If needed, an IV is required to obtain more quality images). This is performed in one of our Cardiology Echo Departments     4) Schedule TWO Part Chemical Stress Test @ Checkout Desk or by calling [phone]. This is performed in one of our Cardiology Nuclear Departments (instructions are below)     [facility] Nuclear Stress Test Instructions    PLEASE REPORT TO the appropriate location in which you are scheduled:    ____[facility] ([address], [place], [place]) - [phone]   ____[facility] ([address], [address], [place], [place]) - [phone]    ____[facility] ([address] [place], [place]) - [phone]    ____[facility] ([address], [place], [place]) - [phone]    ____[facility] ([address] [place], [place]) - [phone]     Are you able to raise your arm up by your head for about 20 minutes?   Can you lie on your back for approximately 20 minutes with minimal movement?    The Thallium evaluation has two parts -- two nuclear scans.  The first scan is done in the morning and the second three to four hours later.    Wear comfortable clothing. Shorts or pants. (No dresses or skirts please).  Bring or wear sneakers/walking shoes if you are walking on [facility].  Please let the nuclear technologists know if you plan on flying after the test.    NO CAFFEINE 24 HOURS PRIOR TO TEST. Caffeine can  make an abnormal test look normal. Examples: coffee, tea, decaf drinks, cola, chocolate.     DO NOT EAT OR DRINK THE MORNING OF YOUR TEST unless otherwise instructed. (You may have a couple sips of water).    If you are a diabetic, if insulin dependent: please take one third of your insulin with two pieces of dry toast and a small juice). Bring insulin and medication with you to the test.     _X_ TAKE MORNING MEDICATIONS WITH A COUPLE SIPS OF WATER PRIOR TO TEST.     HOLD THE FOLLOWING MEDICATIONS: Vitamins        WHAT TO DO BETWEEN THE FIRST TWO THALLIUM SCANS:    Please do NOT drink Coffee, tea, cola, alcohol or milk    1. No strenuous exercise should be performed during this time.  2. A light lunch is permissible. The less you eat, the more accurate your test results.     The following is what you may have:   Water, 7-UP, Sprite, Apple Juice  If you feel like you must eat, you may have TWO of the following items:  Dry toast or crackers  Fruit, Fruit salad, or Jell-O  Small vegetable salad with a small amount of non-fat dressing (Please do NOT eat meat, cheese or eggs).     3. Please return 15 minutes prior to the schedule of your second scan. Our nuclear technologist will tell you exactly what  time to return.  4. Please do not use tobacco products in between scans.  5. After the first scan is completed, you may resume usual medications.      Please send a MyChart message or call the Cardiology G[facility] Team with questions, [phone], Melanie RN, Morrie Sheldon RN, Heather RN, Korissa Horsford RN, Chrys Racer RN, Marcelino Duster, RN, [facility] LPN    You may receive test results in MyChart before the ordering provider has reviewed them. Our care team will follow up with you after reviewing the tests to discuss your care. This may take up to 5-7 business days if results are not urgently needing to be addressed. Thank you for your patience.      [facility] Team Fax number: [phone]  Cardiology Scheduling number: [phone]  On-Call/After-Hours Cardiology number: [phone]

## 2021-09-16 NOTE — Telephone Encounter
===  View-only below this line===  ----- Message -----  From: Reubin Milan, MD  Sent: 09/16/2021   3:51 PM CDT  To: [facility] Team [facility]    Heather-overall the labs are stable.  The lipids look improved.  Please continue with the current plan and repeat the labs and lipids in 6 months time.  Thanks RG

## 2021-09-16 NOTE — Progress Notes
Date of Service: 09/16/2021    Jared Hebert is a 70 y.o. male. DOB: Oct 26, 1951   MRN#: [id number]    Subjective: follow up       Back Pain  This is a chronic problem. The current episode started more than 1 month ago. The problem occurs intermittently. The pain is at a severity of 4/10. The pain is moderate. The pain is worse during the day. The symptoms are aggravated by bending and standing. Pertinent negatives include no abdominal pain, chest pain, dysuria, fever, headaches, numbness, weakness or weight loss. He has tried chiropractic manipulation and NSAIDs for the symptoms. The treatment provided no relief.   Hypertension  This is a chronic problem. The problem is controlled. Pertinent negatives include no anxiety, blurred vision, chest pain, headaches, malaise/fatigue, neck pain, orthopnea, palpitations or shortness of breath.   Patient Reported Other  What medical problem brings you in to see the provider? Semi-Aunnal Office visit and review  of Labs..  Associated symptoms include arthralgias, fatigue and myalgias. Pertinent negatives include no abdominal pain, anorexia, change in bowel habit, chest pain, chills, congestion, coughing, diaphoresis, fever, headaches, joint swelling, nausea, neck pain, numbness, rash, sore throat, swollen glands, urinary symptoms, vertigo, visual change, vomiting or weakness. He has tried acetaminophen, NSAIDs, position changes and walking for the symptoms. The treatment provided mild relief.     Payam Zero is 70 y.o. patient who presents to clinic for follow up. He established care 07/19/2019. He is a Engineer, civil (consulting) in [place]. No smoking, no alcohol, no street drugs. No regular exercise. He has 4 children.     He was last seen 01/2021    He had Rt should er surgery 11/2020, 04/2021 and again in 04/2021 at [facility].     He saw sleep clinic 05/2021    He saw Cardiology 09/16/21    He was in clinic for low back pain for 6 weeks as above. 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   He was referred by cardiology to GI because of anemia. He did EGD and colonscopy 01/25/2020 and was found to have a non-bleeding gastric ulcer 15mm that was biopsied and is scheduled for repeat EGD in 2 months. Colonscopy was normal. Results of scopes are sent for scanning. I told him to stop all  NSAID and Mobic at this time. He had a repeat EGD 05/07/2020 by his outside GI doctor and was reported that gastric ulcer has healed.      His calcium score was 315. He saw cardiology 12/2019. He had CTA chest which was normal but There was incidental cholelithiasis and a right renal lesion that will require ultrasound for further definition.     He had a normal stress test 11/09/2020  He failed oral iron so cardiology ordered IV Iron.     He had labs drawn 09/16/21    He had abdomen US 01/30/2020 which showed:  Small hypoechoic exophytic right renal nodule most compatible with a   hemorrhagic cyst   Cholelithiasis   Mild bladder wall thickening, nonspecific but most likely secondary to   bladder outlet stenosis or neurogenic bladder     He managed to lose intentionally 55 Lb over last 1 year by having ketogenic diet. He takes multivitamin daily.    He also struggles with depression but no intent to harm himself or others. His brother had depression and committed suicide in 2003.  Medical History:   Diagnosis Date   ? Arthritis     back & shoulders   ? Cataract 5 Years ago   ? Depression 20+ Yrs   ? Generalized headaches Most of adult life   ? Hyperlipidemia    ? Hypertension    ? Joint pain 10 yrs ago   ? Joint stiffness 2009   ? Sleep apnea    ? Ulcer 12/2019       Surgical History:   Procedure Laterality Date   ? VASECTOMY  1984   ? HX CATARACT REMOVAL Bilateral 02/2017    [facility]   ? MENISCECTOMY Left 2019   ? PARS PLANA MECHANICAL VITRECTOMY WITH  Kenolog Right 09/02/2018    Performed by Blair Hailey, [facility] at [facility]   ? PARS PLANA MECHANICAL VITRECTOMY Left 11/04/2018 Performed by Blair Hailey, MD at [facility] OR   ? HX ADENOIDECTOMY     ? HX ARTHROSCOPIC SURGERY  4 yrs ago    Left Knee Scope   ? HX TONSILLECTOMY     ? KNEE SURGERY  Left Knee Scope 4 yrs ago   ? NASAL SURGERY      TWICE       family history includes Arthritis in his father and mother; Back pain in his brother, father, and mother; Cataract in his father and mother; Depression in his brother; Heart Disease in his father; Heart problem in his father; Hypertension in his father; Joint Pain in his brother, father, and mother; Osteoporosis in his mother.    Social History     Socioeconomic History   ? Marital status: Married   Tobacco Use   ? Smoking status: Former Smoker     Packs/day: 1.00     Years: 10.00     Pack years: 10.00     Types: Cigarettes     Start date: 08/04/1967     Quit date: 2008     Years since quitting: 14.7   ? Smokeless tobacco: Former Neurosurgeon   ? Tobacco comment: 15 + yrs since last smoked   Vaping Use   ? Vaping Use: Never used   Substance and Sexual Activity   ? Alcohol use: Not Currently     Alcohol/week: 2.0 standard drinks     Types: 2 Cans of beer per week     Comment: Social drinker   ? Drug use: Never   ? Sexual activity: Yes     Partners: Female     Birth control/protection: None       Social History     Tobacco Use   ? Smoking status: Former Smoker     Packs/day: 1.00     Years: 10.00     Pack years: 10.00     Types: Cigarettes     Start date: 08/04/1967     Quit date: 2008     Years since quitting: 14.7   ? Smokeless tobacco: Former Neurosurgeon   ? Tobacco comment: 15 + yrs since last smoked   Substance Use Topics   ? Alcohol use: Not Currently     Alcohol/week: 2.0 standard drinks     Types: 2 Cans of beer per week     Comment: Social drinker        Review of Systems   Constitutional: Positive for fatigue. Negative for chills, diaphoresis, fever, malaise/fatigue, night sweats, weight gain and weight loss.   HENT: Negative for congestion, sore throat and stridor.  Eyes: Negative for blurred vision.   Cardiovascular: Negative for chest pain, orthopnea, palpitations and syncope.   Respiratory: Negative for cough, shortness of breath, snoring and wheezing.    Skin: Negative for rash.   Musculoskeletal: Positive for arthralgias, back pain and myalgias. Negative for joint pain, joint swelling and neck pain.   Gastrointestinal: Negative for abdominal pain, anorexia, change in bowel habit, diarrhea, nausea and vomiting.   Genitourinary: Negative for dysuria, flank pain and hesitancy.   Neurological: Negative for disturbances in coordination, excessive daytime sleepiness, dizziness, focal weakness, headaches, numbness, seizures, vertigo and weakness.   Psychiatric/Behavioral: Negative for depression, hallucinations and memory loss. The patient is not nervous/anxious.    Allergic/Immunologic: Negative for hives.   All other systems reviewed and are negative.      Objective:     ? ascorbic acid (VITAMIN C) 500 mg tablet Take 500 mg by mouth daily.   ? aspirin EC 81 mg tablet Take 81 mg by mouth daily. Take with food.   ? Celecoxib 50 mg cap TAKE 1 CAPSULE BY MOUTH TWICE A DAY   ? cetirizine (ZYRTEC) 10 mg tablet Take 10 mg by mouth.   ? citalopram (CELEXA) 40 mg tablet Take one tablet by mouth daily.   ? cyclobenzaprine (FLEXERIL) 5 mg tablet Take 1-2 tablets by mouth daily as needed.   ? ezetimibe (ZETIA) 10 mg tablet TAKE 1 TABLET BY MOUTH DAILY   ? fluticasone propionate (FLONASE) 50 mcg/actuation nasal spray, suspension Apply 1 spray to each nostril as directed daily. Shake bottle gently before using.   ? folic acid (FOLVITE) 1 mg tablet TAKE 1 TABLET BY MOUTH EVERY DAY   ? gabapentin (NEURONTIN) 300 mg capsule Take one capsule by mouth every 8 hours.   ? glucosamine(+) 500 mg tab Take 1,500 mg by mouth once.   ? lifitegrast (XIIDRA) 5 % ophthalmic drop Apply one drop to both eyes twice daily.   ? losartan (COZAAR) 25 mg tablet Take one tablet by mouth daily.   ? meloxicam (MOBIC) 15 mg tablet Take one tablet by mouth daily.   ? rosuvastatin (CRESTOR) 20 mg tablet TAKE 1 TABLET BY MOUTH EVERY DAY   ? tramadol HCl (TRAMADOL PO) Take  by mouth.   ? zolpidem (AMBIEN) 5 mg tablet Take one tablet by mouth at bedtime as needed for Sleep.     Vitals:    09/16/21 1428 09/16/21 1433   BP: (!) 129/92 129/84   BP Source: Arm, Left Upper Arm, Left Upper   Pulse: 75 69   Temp: 36.3 ?C (97.3 ?F)    Resp: 16    SpO2: 97%    TempSrc: Temporal    PainSc: Zero    Weight: 82 kg (180 lb 12.8 oz)    Height: 175.3 cm (5' 9.02)      Body mass index is 26.69 kg/m?Marland Kitchen     Physical Exam  Vitals and nursing note reviewed.   Constitutional:       General: He is not in acute distress.     Appearance: He is well-developed. He is not diaphoretic.   HENT:      Head: Normocephalic and atraumatic.      Nose: Nose normal.   Eyes:      General: No scleral icterus.        Right eye: No discharge.         Left eye: No discharge.      Conjunctiva/sclera: Conjunctivae normal.  Pupils: Pupils are equal, round, and reactive to light.   Neck:      Thyroid: No thyromegaly.      Vascular: No JVD.      Trachea: No tracheal deviation.   Cardiovascular:      Rate and Rhythm: Normal rate and regular rhythm.      Heart sounds: Normal heart sounds. No murmur heard.    No friction rub.   Pulmonary:      Effort: Pulmonary effort is normal. No respiratory distress.      Breath sounds: Normal breath sounds. No wheezing or rales.   Chest:      Chest wall: No tenderness.   Abdominal:      General: There is no distension.      Palpations: Abdomen is soft. There is no mass.      Tenderness: There is no abdominal tenderness. There is no guarding or rebound.   Musculoskeletal:         General: Normal range of motion.      Cervical back: Normal range of motion and neck supple.   Lymphadenopathy:      Cervical: No cervical adenopathy.   Skin:     General: Skin is warm and dry.   Neurological:      Mental Status: He is alert and oriented to person, place, and time. Cranial Nerves: No cranial nerve deficit.   Psychiatric:         Mood and Affect: Mood normal.         Behavior: Behavior normal.         Thought Content: Thought content normal.         Judgment: Judgment normal.          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   He was referred by cardiology to GI because of anemia. He did EGD and colonscopy 01/25/2020 and was found to have a non-bleeding gastric ulcer 15mm that was biopsied and is scheduled for repeat EGD in 2 months. Colonscopy was normal. Results of scopes are sent for scanning. I told him to stop all  NSAID and Mobic at this time  He failed oral iron so cardiology ordered IV Iron.   He is scheduled to repeat EGD 05/07/20. I told him to restart Mobic with food if EGD is back to normal.   He had a repeat EGD 05/07/2020 by his outside GI doctor and was reported that gastric ulcer has healed.    Stable. Avoid NSAID    Hypertension:  He is on HCTZ and Losarten  BP Readings from Last 3 Encounters:   09/16/21 129/84   09/16/21 124/82   05/28/21 133/76   continue  He had stress test and echo in 02/2016 that were reported as normal and I reviewed in care Every where  We ordered CT calcium  score which he did 08/2019  Stable    Coronary calcification:  CT calcium scan 08/2019:  Quantitative Coronary Calcium Score:   Left main: 0   LAD: 114   LCX: 144   RCA: 5 7   Total calcium score: 315   He saw cardiology and had echo and normal stress test 10/2019 and has a follow up 12/2019 and 09/16/21    Renal mass:  His calcium score was 315. He saw cardiology 12/2019. He had CTA chest which was normal but There was incidental cholelithiasis and a right renal lesion that will require ultrasound for further definition.     He had a normal stress test 11/09/2020  He had abdomen US 01/30/2020 which showed:  Small hypoechoic exophytic right renal nodule most compatible with a   hemorrhagic cyst   Cholelithiasis   Mild bladder wall thickening, nonspecific but most likely secondary to   bladder outlet stenosis or neurogenic bladder   Stable    Hyperlipidemia:  He is on Zocor 20mg  daily but did not take for 30 days. He had cramps with Lipitor.  We increased Zocor to 40mg  daily 07/19/2019  Cardiology switched him to Crestor 10mg  daily  Plan on repeat labs in 3 months  Lab Results   Component Value Date    CHOL 99 09/16/2021    TRIG 91 09/16/2021    HDL 46 09/16/2021    LDL 39 09/16/2021    VLDL 18 09/16/2021    NONHDLCHOL 53 09/16/2021      Anxiety and Depression:   He had for 20 years  He is on Effexor and Remeron  He is not feeling that this combination is helping  He is homicidal or suicidal but his brother committed suicide in 2013  We got him to taper off his medications over2 weeks and we he started Celexa 20mg  daily after he tapered off  We referred to psychologist  I am referring this patient to the behavior health consultant for the following conditions:  Health Risk Behaviors:  Stress management  Mental Health Conditions:  Depression and Anxiety  We increased Celexa from 20 to 40mg  daily back in 08/2019  He is doing better    History of sleep apnea:  He is not snoring after he lost weight  He struggles with staying sleep and Ambien did not help  He saw sleep clinic 05/2021    Weakness:  Normal labs and TSH and B12 level   He is on ketogenic diet    Hearing deficits:  He has hearing aids    Routine health maintenance:    Colonoscopy: 2014, normal  Tdap: 2015  Pneumovax: Prevnar 07/19/2019 and plan on pneumovax in a year  Influenza:09/2018, 10/2019  Eye exam: 2019  Dental exam: 2019  AAA screening: 07/2019  He had COVID vaccine 10/2020    Return to clinic in 6 months with labs    Total of 40 minutes were spent on the same day of the visit including preparing to see the patient, obtaining and/or reviewing separately obtained history, performing a medically appropriate examination and/or evaluation, counseling and educating the patient/family/caregiver, ordering medications, tests, or procedures, referring and communication with other health care professionals, documenting clinical information in the electronic or other health record, independently interpreting results and communicating results to the patient/family/caregiver, and care coordination.   I Counseled patient regarding low back pain, depression and weakness. We discussed safety during COVID19 pandemic.     Orders Placed This Encounter   ?  CBC AND DIFF   ? BASIC METABOLIC PANEL   ? HEMOGLOBIN A1C   ? LIPID PROFILE   ? LIVER FUNCTION PANEL   ? TSH WITH FREE T4 REFLEX   ? citalopram (CELEXA) 40 mg tablet   ? losartan (COZAAR) 25 mg tablet   ? meloxicam (MOBIC) 15 mg tablet   ? zolpidem (AMBIEN) 5 mg tablet     Encounter Medications   Medications   ? citalopram (CELEXA) 40 mg tablet     Sig: Take one tablet by mouth daily.     Dispense:  90 tablet     Refill:  3   ? losartan (COZAAR) 25 mg tablet     Sig: Take one tablet by mouth daily.     Dispense:  90 tablet     Refill:  3     Put refills on file   ? meloxicam (MOBIC) 15 mg tablet     Sig: Take one tablet by mouth daily.     Dispense:  90 tablet     Refill:  3   ? zolpidem (AMBIEN) 5 mg tablet     Sig: Take one tablet by mouth at bedtime as needed for Sleep.     Dispense:  30 tablet     Refill:  5     Future Appointments   Date Time Provider Department Center   09/16/2021  3:40 PM Al-Hihi, Roderic Scarce, MD [facility]   11/12/2021  8:00 AM Velta Addison, MD [facility]   03/18/2022  9:00 AM Al-Hihi, Roderic Scarce, MD [facility] Patient Instructions   Get fasting labs few days before return to clinic in 6 months

## 2021-09-16 NOTE — Patient Instructions
Get fasting labs few days before return to clinic in 6 months

## 2021-10-04 ENCOUNTER — Encounter: Admit: 2021-10-04 | Discharge: 2021-10-04 | Payer: BC Managed Care – PPO

## 2021-10-04 MED ORDER — ROSUVASTATIN 20 MG PO TAB
ORAL_TABLET | Freq: Every day | ORAL | 1 refills | 90.00000 days | Status: AC
Start: 2021-10-04 — End: ?

## 2021-10-23 ENCOUNTER — Encounter: Admit: 2021-10-23 | Discharge: 2021-10-23 | Payer: BC Managed Care – PPO

## 2021-10-24 ENCOUNTER — Encounter: Admit: 2021-10-24 | Discharge: 2021-10-24 | Payer: BC Managed Care – PPO

## 2021-10-24 NOTE — Telephone Encounter
LVM for NUC instructions:    PLEASE DO NOT EAT OR DRINK THE MORNING OF YOUR TEST.  Water is ok to drink with your morning medications.    NO CAFFEINE 24 HOURS PRIOR TO TEST:  Includes coffee, tea, decaffeinated drinks and soda or any foods CONTAINING CHOCOLATE.     You will get an IV placed in your arm for the test.    There may be enough time to leave to get a snack after the stress portion of the test. The NUC tech will give you a list of appropriate types of food you may eat and tell you what time to return for second set of images.      HOLD THE FOLLOWING MEDICATIONS AS INDICATED BELOW:  Vitamins  ECHO at 8 and left call back number.

## 2021-10-25 ENCOUNTER — Ambulatory Visit: Admit: 2021-10-25 | Discharge: 2021-10-25 | Payer: BC Managed Care – PPO

## 2021-10-25 ENCOUNTER — Encounter: Admit: 2021-10-25 | Discharge: 2021-10-25 | Payer: BC Managed Care – PPO

## 2021-10-25 DIAGNOSIS — Z136 Encounter for screening for cardiovascular disorders: Secondary | ICD-10-CM

## 2021-10-25 DIAGNOSIS — R911 Solitary pulmonary nodule: Secondary | ICD-10-CM

## 2021-10-25 DIAGNOSIS — I10 Essential (primary) hypertension: Secondary | ICD-10-CM

## 2021-10-25 DIAGNOSIS — E782 Mixed hyperlipidemia: Secondary | ICD-10-CM

## 2021-10-25 MED ORDER — RP DX TC-99M TETROFOSMIN MCI
12 | Freq: Once | INTRAVENOUS | 0 refills | Status: CP
Start: 2021-10-25 — End: ?
  Administered 2021-10-25: 18:00:00 12.9 via INTRAVENOUS

## 2021-10-25 MED ORDER — ALBUTEROL SULFATE 90 MCG/ACTUATION IN HFAA
2 | RESPIRATORY_TRACT | 0 refills | Status: AC | PRN
Start: 2021-10-25 — End: ?

## 2021-10-25 MED ORDER — RP DX TC-99M TETROFOSMIN MCI
4 | Freq: Once | INTRAVENOUS | 0 refills | Status: CP
Start: 2021-10-25 — End: ?
  Administered 2021-10-25: 15:00:00 4.3 via INTRAVENOUS

## 2021-10-25 MED ORDER — REGADENOSON 0.4 MG/5 ML IV SYRG
.4 mg | Freq: Once | INTRAVENOUS | 0 refills | Status: CP
Start: 2021-10-25 — End: ?
  Administered 2021-10-25: 14:00:00 0.4 mg via INTRAVENOUS

## 2021-10-25 MED ORDER — EUCALYPTUS-MENTHOL MM LOZG
1 | Freq: Once | ORAL | 0 refills | Status: AC | PRN
Start: 2021-10-25 — End: ?

## 2021-10-25 MED ORDER — NITROGLYCERIN 0.4 MG SL SUBL
.4 mg | SUBLINGUAL | 0 refills | Status: AC | PRN
Start: 2021-10-25 — End: ?

## 2021-10-25 MED ORDER — AMINOPHYLLINE 500 MG/20 ML IV SOLN
50 mg | INTRAVENOUS | 0 refills | Status: AC | PRN
Start: 2021-10-25 — End: ?

## 2021-10-25 MED ORDER — SODIUM CHLORIDE 0.9 % IV SOLP
250 mL | INTRAVENOUS | 0 refills | Status: AC | PRN
Start: 2021-10-25 — End: ?

## 2021-10-29 ENCOUNTER — Encounter: Admit: 2021-10-29 | Discharge: 2021-10-29 | Payer: BC Managed Care – PPO

## 2021-10-29 NOTE — Telephone Encounter
Spk to pt, last cee 12/2020 with new glasses rx he is wearing currently. Headaches after computer use with blurred va. Pt does not want to wait until 01/2022. Next available is 02/2022 and pt can add themselves to the wait list to be offered a sooner appt. Pt will think about it and reach back out. Bk.

## 2021-11-01 ENCOUNTER — Encounter: Admit: 2021-11-01 | Discharge: 2021-11-01 | Payer: BC Managed Care – PPO

## 2021-11-04 ENCOUNTER — Encounter: Admit: 2021-11-04 | Discharge: 2021-11-04 | Payer: BC Managed Care – PPO

## 2021-11-05 ENCOUNTER — Encounter: Admit: 2021-11-05 | Discharge: 2021-11-05 | Payer: BC Managed Care – PPO

## 2021-11-05 NOTE — Telephone Encounter
-----   Message from Scot Dock, RN sent at 11/04/2021  1:08 PM CST -----  Regarding: New order  Hello. I have entered an order for 128 CCTA +/- FFR. Please let me know if I can be of any further assistance.     Thank you,  Museum/gallery curator

## 2021-11-06 ENCOUNTER — Encounter: Admit: 2021-11-06 | Discharge: 2021-11-06 | Payer: BC Managed Care – PPO

## 2021-11-06 MED ORDER — NEBIVOLOL 10 MG PO TAB
ORAL_TABLET | ORAL | 0 refills | 60.00000 days | Status: AC
Start: 2021-11-06 — End: ?

## 2021-11-10 ENCOUNTER — Encounter: Admit: 2021-11-10 | Discharge: 2021-11-10 | Payer: BC Managed Care – PPO

## 2021-11-10 ENCOUNTER — Ambulatory Visit: Admit: 2021-11-10 | Discharge: 2021-11-10 | Payer: BC Managed Care – PPO

## 2021-11-10 DIAGNOSIS — E785 Hyperlipidemia, unspecified: Secondary | ICD-10-CM

## 2021-11-10 DIAGNOSIS — I10 Essential (primary) hypertension: Secondary | ICD-10-CM

## 2021-11-10 DIAGNOSIS — M1990 Unspecified osteoarthritis, unspecified site: Secondary | ICD-10-CM

## 2021-11-10 DIAGNOSIS — G4730 Sleep apnea, unspecified: Secondary | ICD-10-CM

## 2021-11-10 DIAGNOSIS — H269 Unspecified cataract: Secondary | ICD-10-CM

## 2021-11-10 DIAGNOSIS — M2550 Pain in unspecified joint: Secondary | ICD-10-CM

## 2021-11-10 DIAGNOSIS — R519 Generalized headaches: Secondary | ICD-10-CM

## 2021-11-10 DIAGNOSIS — IMO0002 Ulcer: Secondary | ICD-10-CM

## 2021-11-10 DIAGNOSIS — M2560 Stiffness of unspecified joint, not elsewhere classified: Secondary | ICD-10-CM

## 2021-11-10 DIAGNOSIS — F32A Depression: Secondary | ICD-10-CM

## 2021-11-10 DIAGNOSIS — B029 Zoster without complications: Secondary | ICD-10-CM

## 2021-11-10 DIAGNOSIS — M25512 Pain in left shoulder: Secondary | ICD-10-CM

## 2021-11-10 MED ORDER — VALACYCLOVIR 1 GRAM PO TAB
1000 mg | ORAL_TABLET | ORAL | 0 refills | Status: AC
Start: 2021-11-10 — End: ?

## 2021-11-10 NOTE — Progress Notes
Date of Service: 11/10/2021    Jared Hebert is a 70 y.o. male.  DOB: 04-05-1951  MRN: [id number]     Subjective:             Chief Complaint   Patient presents with   ? Fall     X 6 days ago, no loss of concious, landed on Ly shoulder   ? Shoulder Pain     Lt shoudler pain x 6 days.    ? Rash     On left side of chest, back, arm, and shoulder     History of Present Illness:    Jared Hebert is a pleasant 70 y.o. male who presents today with left shoulder pain and rash. Pt states he fell while racking leaves (leaves were slick) landed on left shoulder, noticed rash formation after injury. Has been preforming ROM with shoulder to help relieve pain. Patient unsure if he has received shingles vaccine.       Review of Systems   Musculoskeletal: Positive for arthralgias. Negative for back pain and joint swelling.   Skin: Positive for rash. Negative for wound.         Allergies   Allergen Reactions   ? Seasonal Allergies SNEEZING      ? ascorbic acid (VITAMIN C) 500 mg tablet Take 500 mg by mouth daily.   ? aspirin EC 81 mg tablet Take 81 mg by mouth daily. Take with food.   ? Celecoxib 50 mg cap TAKE 1 CAPSULE BY MOUTH TWICE A DAY   ? cetirizine (ZYRTEC) 10 mg tablet Take 10 mg by mouth.   ? citalopram (CELEXA) 40 mg tablet Take one tablet by mouth daily.   ? cyclobenzaprine (FLEXERIL) 5 mg tablet Take 1-2 tablets by mouth daily as needed.   ? ezetimibe (ZETIA) 10 mg tablet TAKE 1 TABLET BY MOUTH DAILY   ? fluticasone propionate (FLONASE) 50 mcg/actuation nasal spray, suspension Apply 1 spray to each nostril as directed daily. Shake bottle gently before using.   ? folic acid (FOLVITE) 1 mg tablet TAKE 1 TABLET BY MOUTH EVERY DAY   ? gabapentin (NEURONTIN) 300 mg capsule Take one capsule by mouth every 8 hours.   ? glucosamine(+) 500 mg tab Take 1,500 mg by mouth once.   ? lifitegrast (XIIDRA) 5 % ophthalmic drop Apply one drop to both eyes twice daily.   ? losartan (COZAAR) 25 mg tablet Take one tablet by mouth daily. ? meloxicam (MOBIC) 15 mg tablet Take one tablet by mouth daily.   ? [START ON 12/03/2021] nebivoloL (BYSTOLIC) 10 mg tablet Take bystolic 10mg  on 12/13 @ 8PM and 1`2/14 @ 8AM  Indications: ccta   ? rosuvastatin (CRESTOR) 20 mg tablet TAKE 1 TABLET BY MOUTH EVERY DAY   ? tramadol HCl (TRAMADOL PO) Take  by mouth.   ? valACYclovir (VALTREX) 1 gram tablet Take one tablet by mouth every 8 hours for 7 days. Indications: skin and skin structure infection   ? zolpidem (AMBIEN) 5 mg tablet Take one tablet by mouth at bedtime as needed for Sleep.       Medical History:   Diagnosis Date   ? Arthritis     back & shoulders   ? Cataract 5 Years ago   ? Depression 20+ Yrs   ? Generalized headaches Most of adult life   ? Hyperlipidemia    ? Hypertension    ? Joint pain 10 yrs ago   ? Joint stiffness 2009   ?  Sleep apnea    ? Ulcer 12/2019     Surgical History:   Procedure Laterality Date   ? VASECTOMY  1984   ? HX CATARACT REMOVAL Bilateral 02/2017    OS Toric   ? MENISCECTOMY Left 2019   ? PARS PLANA MECHANICAL VITRECTOMY WITH  [place] Right 09/02/2018    Performed by Blair Hailey, MD at [facility] [place]   ? PARS PLANA MECHANICAL VITRECTOMY Left 11/04/2018    Performed by Blair Hailey, MD at [facility] [place]   ? HX ADENOIDECTOMY     ? HX ARTHROSCOPIC SURGERY  4 yrs ago    Left Knee Scope   ? HX TONSILLECTOMY     ? KNEE SURGERY  Left Knee Scope 4 yrs ago   ? NASAL SURGERY      TWICE     Family History   Problem Relation Age of Onset   ? Cataract Mother    ? Arthritis Mother    ? Back pain Mother    ? Joint Pain Mother    ? Osteoporosis Mother    ? Cataract Father    ? Heart Disease Father    ? Arthritis Father    ? Back pain Father    ? Heart problem Father    ? Hypertension Father    ? Joint Pain Father    ? Depression Brother    ? Back pain Brother    ? Joint Pain Brother    ? Glaucoma Neg Hx    ? Macular Degen Neg Hx      Social History     Socioeconomic History   ? Marital status: Married   Tobacco Use   ? Smoking status: Former Packs/day: 1.00     Years: 10.00     Pack years: 10.00     Types: Cigarettes     Start date: 08/04/1967     Quit date: 2008     Years since quitting: 14.8   ? Smokeless tobacco: Former   ? Tobacco comments:     15 + yrs since last smoked   Vaping Use   ? Vaping Use: Never used   Substance and Sexual Activity   ? Alcohol use: Not Currently     Alcohol/week: 2.0 standard drinks     Types: 2 Cans of beer per week     Comment: Social drinker   ? Drug use: Never   ? Sexual activity: Yes     Partners: Female     Birth control/protection: None                Objective:           Vitals:    11/10/21 1040   BP: (!) 142/90   BP Source: Arm, Left Upper   Pulse: 78   Temp: 36.6 ?C (97.9 ?F)   Resp: 16   SpO2: 97%   TempSrc: Oral   Weight: 81.8 kg (180 lb 6.4 oz)   Height: 172.7 cm (5' 8)     Body mass index is 27.43 kg/m?Marland Kitchen     Physical Exam  Constitutional:       Appearance: Normal appearance. He is normal weight.   HENT:      Head: Normocephalic.      Mouth/Throat:      Mouth: Mucous membranes are moist.      Pharynx: Oropharynx is clear.   Eyes:      Extraocular Movements: Extraocular movements intact.  Conjunctiva/sclera: Conjunctivae normal.      Pupils: Pupils are equal, round, and reactive to light.   Cardiovascular:      Rate and Rhythm: Normal rate and regular rhythm.      Heart sounds: Normal heart sounds.   Pulmonary:      Effort: Pulmonary effort is normal.      Breath sounds: Normal breath sounds.   Musculoskeletal:         General: Signs of injury present. No swelling or deformity. Normal range of motion.      Right shoulder: Normal.      Left shoulder: Tenderness and crepitus present. No swelling, effusion, laceration or bony tenderness. Normal range of motion. Normal strength. Normal pulse.   Skin:     General: Skin is warm.      Capillary Refill: Capillary refill takes less than 2 seconds.      Findings: Rash present. Rash is vesicular. Rash is not crusting.             Comments: No oozing or crusting observed    Neurological:      General: No focal deficit present.      Mental Status: He is alert and oriented to person, place, and time.   Psychiatric:         Mood and Affect: Mood normal.                    Assessment and Plan:    Don Sauvageau. Schooler was seen today for fall, shoulder pain and rash.    Diagnoses and all orders for this visit:    Herpes zoster without complication  -     valACYclovir (VALTREX) 1 gram tablet; Take one tablet by mouth every 8 hours for 7 days. Indications: skin and skin structure infection    Acute pain of left shoulder     Please follow at home interventions provided to you today via educational handout.     Patient Instructions   Randie Heinz to meet you today, if you continue to have issues please schedule an appt. with your PCP. Take medications as directed. It is my recommendation that as soon as your rash clears I would make an appointment with your primary care doctor. for your Shingles vaccines. If your shoulder continues to hurt after your rash has cleared It is my recommendation that you request an MRI from your primary care doctor.  I hope you feel better,take care.     Return if symptoms worsen or fail to improve.

## 2021-11-18 ENCOUNTER — Encounter: Admit: 2021-11-18 | Discharge: 2021-11-18 | Payer: BC Managed Care – PPO

## 2021-11-18 MED ORDER — FOLIC ACID 1 MG PO TAB
ORAL_TABLET | Freq: Every day | 2 refills
Start: 2021-11-18 — End: ?

## 2021-11-18 MED ORDER — EZETIMIBE 10 MG PO TAB
ORAL_TABLET | Freq: Every day | 1 refills
Start: 2021-11-18 — End: ?

## 2021-11-22 ENCOUNTER — Encounter: Admit: 2021-11-22 | Discharge: 2021-11-22 | Payer: BC Managed Care – PPO

## 2021-11-27 ENCOUNTER — Encounter: Admit: 2021-11-27 | Discharge: 2021-11-27 | Payer: BC Managed Care – PPO

## 2021-12-04 ENCOUNTER — Ambulatory Visit: Admit: 2021-12-04 | Discharge: 2021-12-04 | Payer: BC Managed Care – PPO

## 2021-12-04 ENCOUNTER — Encounter: Admit: 2021-12-04 | Discharge: 2021-12-04 | Payer: BC Managed Care – PPO

## 2021-12-04 DIAGNOSIS — I10 Essential (primary) hypertension: Secondary | ICD-10-CM

## 2021-12-04 DIAGNOSIS — E669 Obesity, unspecified: Secondary | ICD-10-CM

## 2021-12-04 DIAGNOSIS — R931 Abnormal findings on diagnostic imaging of heart and coronary circulation: Secondary | ICD-10-CM

## 2021-12-04 DIAGNOSIS — E782 Mixed hyperlipidemia: Secondary | ICD-10-CM

## 2021-12-04 LAB — POC CREATININE, RAD: CREATININE, POC: 0.8 mg/dL (ref 0.4–1.24)

## 2021-12-04 MED ORDER — SODIUM CHLORIDE 0.9 % IV SOLP
250 mL | INTRAVENOUS | 0 refills | Status: AC
Start: 2021-12-04 — End: ?

## 2021-12-04 MED ORDER — DIPHENHYDRAMINE HCL 50 MG PO CAP
50 mg | Freq: Once | ORAL | 0 refills | Status: AC | PRN
Start: 2021-12-04 — End: ?

## 2021-12-04 MED ORDER — DIPHENHYDRAMINE HCL 50 MG/ML IJ SOLN
50 mg | Freq: Once | INTRAVENOUS | 0 refills | Status: AC | PRN
Start: 2021-12-04 — End: ?

## 2021-12-04 MED ORDER — SODIUM CHLORIDE 0.9 % IV SOLP
250 mL | INTRAVENOUS | 0 refills | Status: AC | PRN
Start: 2021-12-04 — End: ?

## 2021-12-04 MED ORDER — METOPROLOL TARTRATE 5 MG/5 ML IV SOLN
5 mg | INTRAVENOUS | 0 refills | Status: AC | PRN
Start: 2021-12-04 — End: ?

## 2021-12-04 MED ORDER — IOPAMIDOL 370 MG IODINE /ML (76 %) IV SOLN
100 mL | Freq: Once | INTRAVENOUS | 0 refills | Status: CP
Start: 2021-12-04 — End: ?
  Administered 2021-12-04: 18:00:00 100 mL via INTRAVENOUS

## 2021-12-04 MED ORDER — METHYLPREDNISOLONE SOD SUC(PF) 125 MG/2 ML IJ SOLR
125 mg | Freq: Once | INTRAVENOUS | 0 refills | Status: AC | PRN
Start: 2021-12-04 — End: ?

## 2021-12-04 MED ORDER — IVABRADINE 5 MG PO TAB
15 mg | Freq: Once | ORAL | 0 refills | Status: AC | PRN
Start: 2021-12-04 — End: ?

## 2021-12-04 MED ORDER — SODIUM CHLORIDE 0.9 % IJ SOLN
100 mL | Freq: Once | INTRAVENOUS | 0 refills | Status: CP
Start: 2021-12-04 — End: ?
  Administered 2021-12-04: 18:00:00 100 mL via INTRAVENOUS

## 2021-12-04 MED ORDER — NITROGLYCERIN 0.4 MG SL SUBL
.4 mg | SUBLINGUAL | 0 refills | Status: AC | PRN
Start: 2021-12-04 — End: ?

## 2021-12-06 ENCOUNTER — Encounter: Admit: 2021-12-06 | Discharge: 2021-12-06 | Payer: BC Managed Care – PPO

## 2021-12-08 ENCOUNTER — Encounter: Admit: 2021-12-08 | Discharge: 2021-12-08 | Payer: BC Managed Care – PPO

## 2021-12-08 NOTE — Telephone Encounter
Mychart message sent to the pt.

## 2021-12-08 NOTE — Telephone Encounter
-----   Message from Reubin Milan, MD sent at 12/06/2021  6:31 AM CST -----  Ashley-please let him know the coronary CT suggests three-vessel coronary disease.  Please have him get heart cath plus minus PCI.  Thanks RG

## 2021-12-09 ENCOUNTER — Encounter: Admit: 2021-12-09 | Discharge: 2021-12-09 | Payer: BC Managed Care – PPO

## 2021-12-11 ENCOUNTER — Encounter: Admit: 2021-12-11 | Discharge: 2021-12-11 | Payer: BC Managed Care – PPO

## 2021-12-11 DIAGNOSIS — I998 Other disorder of circulatory system: Secondary | ICD-10-CM

## 2021-12-11 DIAGNOSIS — I10 Essential (primary) hypertension: Secondary | ICD-10-CM

## 2021-12-11 MED ORDER — ASPIRIN 325 MG PO TAB
325 mg | Freq: Once | ORAL | 0 refills
Start: 2021-12-11 — End: ?

## 2021-12-11 NOTE — Patient Instructions
CARDIAC CATHETERIZATION   PRE-ADMISSION INSTRUCTIONS    Patient Name: Jared Hebert  MRN#: [id number]  Date of Birth: 1951/09/04 (70 y.o.)  Today's Date: 12/11/2021    PROCEDURE:  You are scheduled for a Coronary Angiogram with possible Angioplasty/Stenting with Dr. Elayne Snare.    PROCEDURE DATE AND ARRIVAL TIME:  Your procedure date is 01/13/22.  You will receive a call from the Cath lab staff between 8:00 a.m. and noon on the business day prior to your procedure to let you know at what time to arrive on the day of your procedure.    Please check in at the Admitting Desk in the [facility] for your procedure. [facility] Entrance and and take a right. Continue down the hallway past the Cardiovascular Medicine office. That hall will take you into the [facility]. Check in at the desk on the left side.)     (If you have further questions regarding your arrival time for the CV lab, please call [phone] by 3:00pm the day before your procedure. Please leave a message with your name and number, your call will be returned in a timely manner.)    PRE-PROCEDURE APPOINTMENTS:    01/10/22 at 1:30 PM   Office visit to update history and physical (requirement within 30 days of procedure)  with  Bobbye Riggs, APRN  at Cardiovascular [facility]       Anytime between 1/10 and 1/20   Pre-Admission lab work required within 14 days of procedure: CBC w diff and CMP at the lab of your choice.       N/A     COVID Testing must be completed at least 72 hours prior to procedure     FOOD AND DRINK INSTRUCTIONS  Nothing to eat after midnight before your procedure. No caffeine for 24 hours prior to your procedure. You will be under moderate sedation for your procedure.  You may drink clear liquids up to an hour before hospital arrival. This will be confirmed by the Cath lab staff the day before your procedure.     SPECIAL MEDICATION INSTRUCTIONS  Any new prescriptions will be sent to your pharmacy listed on file with [place].        Please either take 4 baby aspirins (4 times 81mg ) or one full strength NON-COATED 325mg  aspirin the morning of your procedure.        TAKE AM OF PROCEDURE:  Losartan      HOLD ALL erectile dysfunction medications for 3 days, unless prescribed for pulmonary hypertension.  HOLD ALL over the counter vitamins or supplements on the morning of your procedure.      Additional Instructions  If you wear CPAP, please bring your mask and machine with you to the hospital.    Take a bath or shower with anti-bacterial soap the evening before, or the morning of the procedure.     Bring photo ID and your health insurance card(s).    Arrange for a driver to take you home from the hospital. Please arrange for a friend or family member to take you home from this test. You cannot take a Taxi, Benedetto Goad, or public transportation as there has to be a responsible person to help care for you after sedation    Bring an accurate list of your current medications with you to the hospital (all medications and supplements taken daily).  Please use the medication list below and write in the date and time when you took your last  dose before your procedure. Update this list of medications as needed.      Wear comfortable clothes and don't bring valuables, other than photo identification card, with you to the hospital.    Please pack a bag for an overnight stay.     Please review your pre-procedure instructions and bring them with you on the day of your procedure.  Call the office at  [phone]  with any questions. You may ask to speak with Dr. Ansel Bong nurse.        ALLERGIES  Allergies   Allergen Reactions    Seasonal Allergies SNEEZING       CURRENT MEDICATIONS  Outpatient Encounter Medications as of 12/11/2021   Medication Sig Dispense Refill    ascorbic acid (VITAMIN C) 500 mg tablet Take 500 mg by mouth daily.      aspirin EC 81 mg tablet Take 81 mg by mouth daily. Take with food.      Celecoxib 50 mg cap TAKE 1 CAPSULE BY MOUTH TWICE A DAY 60 capsule 0    cetirizine (ZYRTEC) 10 mg tablet Take 10 mg by mouth.      citalopram (CELEXA) 40 mg tablet Take one tablet by mouth daily. 90 tablet 3    cyclobenzaprine (FLEXERIL) 5 mg tablet Take 1-2 tablets by mouth daily as needed.      ezetimibe (ZETIA) 10 mg tablet TAKE 1 TABLET BY MOUTH EVERY DAY 90 tablet 1    fluticasone propionate (FLONASE) 50 mcg/actuation nasal spray, suspension Apply 1 spray to each nostril as directed daily. Shake bottle gently before using.      folic acid (FOLVITE) 1 mg tablet TAKE 1 TABLET BY MOUTH EVERY DAY 90 tablet 2    gabapentin (NEURONTIN) 300 mg capsule Take one capsule by mouth every 8 hours. 270 capsule 3    glucosamine(+) 500 mg tab Take 1,500 mg by mouth once.      lifitegrast (XIIDRA) 5 % ophthalmic drop Apply one drop to both eyes twice daily. 60 each 11    losartan (COZAAR) 25 mg tablet Take one tablet by mouth daily. 90 tablet 3    meloxicam (MOBIC) 15 mg tablet Take one tablet by mouth daily. 90 tablet 3    rosuvastatin (CRESTOR) 20 mg tablet TAKE 1 TABLET BY MOUTH EVERY DAY 90 tablet 1    tramadol HCl (TRAMADOL PO) Take  by mouth.      zolpidem (AMBIEN) 5 mg tablet Take one tablet by mouth at bedtime as needed for Sleep. 30 tablet 5     No facility-administered encounter medications on file as of 12/11/2021.       _________________________________________  Form completed by: Scot Dock, RN  Date completed: 12/11/21  Method:  Telephone and mc message      Having Cardiac Catheterization  You may have had chest pain (angina), dizziness, or other symptoms of heart trouble. To help diagnose your problem, your healthcare provider may advise a cardiac catheterization. This is a procedure that looks for a blockage or narrow area in the arteries around the heart. These can cause chest pain or a heart attack if not treated. It can also be used to evaluate other problems with your heart.  This common procedure may also be used to treat a heart problem. It may be done as a planned procedure if you've had chest pain in the past. Or it may be done right away to treat a suspected heart attack.     The catheter  may be placed in the arm or the groin.     Before the procedure  Tell your healthcare team what medicines you take and about any allergies you have.  Follow any directions you're given for not eating or drinking before the procedure.    During the procedure  Hair may be trimmed where the catheter will be inserted. This may be in your leg (groin), wrist, or arm.  You may be given medicine to relax before the procedure.  You'll be given a local anesthetic to prevent pain at the insertion site.  A healthcare provider inserts a tube (sheath) into a blood vessel in your groin or arm.  Through the sheath, a long, thin tube called a catheter is placed inside the artery. The catheter is then guided toward your heart under X-ray guidance.  The catheter can then be used to measure pressures in the heart. It can take blood samples if needed. It can also be used to inject contrast into the heart arteries to look for blockages. This is called angiography.    After the procedure  Your healthcare providers will tell you how long to lie down and keep the insertion site still.  If the insertion site was in your groin, you may need to lie down with your leg still for up to 6 or more hours. A stitch (suture) or closure device such as a collagen plug may be used on the artery site to close the site. If so, you may be able to move sooner. This depends on any bleeding that occurs.  If your arm was used, you may need to wear a special type of immobilizing device and pressure bandage for a few hours after the procedure.  A nurse will check the insertion site and your blood pressure.  You may be asked to drink fluid. This is to help flush the contrast liquid out of your system.  Have someone drive you home from the hospital.  It?s normal to find a small bruise or lump at the insertion site. This should go away in a few weeks.    When to call your healthcare provider  Call your healthcare provider right away if you have any of these:  Pain, swelling, redness, warmth, bleeding, or fluid leaking at the insertion site  New, severe back pain or chest pain  Inability to pee  Blood in your urine, black or sticky stools, or any other kind of bleeding  Fever of 100.4?F ( 38.0?C) or higher, or as advised by your provider  Call 911  Call 911 if you have any of these:     Chest pain or pressure, nausea or vomiting, profuse sweating, dizziness, or fainting  Shortness of breath or trouble breathing  Severe pain, coldness, or a bluish color in the leg or arm where the catheter was inserted  Sudden numbness or weakness in arms, legs, or face, or difficulty speaking  The puncture site swells up very fast  Bleeding from the puncture site doesn't slow down when you press on it firmly     [employer] last reviewed this educational content on 01/22/2021  ? 2000-2022 [employer], [place]. All rights reserved. This information is not intended as a substitute for professional medical care. Always follow your healthcare professional's instructions.

## 2021-12-19 ENCOUNTER — Encounter: Admit: 2021-12-19 | Discharge: 2021-12-19 | Payer: BC Managed Care – PPO

## 2021-12-19 DIAGNOSIS — F419 Anxiety disorder, unspecified: Secondary | ICD-10-CM

## 2021-12-19 DIAGNOSIS — I10 Essential (primary) hypertension: Secondary | ICD-10-CM

## 2021-12-19 DIAGNOSIS — E782 Mixed hyperlipidemia: Secondary | ICD-10-CM

## 2021-12-19 MED ORDER — GABAPENTIN 300 MG PO CAP
ORAL_CAPSULE | Freq: Three times a day (TID) | 3 refills
Start: 2021-12-19 — End: ?

## 2021-12-20 NOTE — Telephone Encounter
Some refill protocol elements NOT Met  Medication name: Gabapentin  Medication Strength: 317m capsule      Off protocol   This refill cannot be delegated    Routed to Provider     LTruitt Leep RN

## 2021-12-27 ENCOUNTER — Encounter: Admit: 2021-12-27 | Discharge: 2021-12-27 | Payer: BC Managed Care – PPO

## 2021-12-27 NOTE — Progress Notes
Website with [employer], confirmed benefits and eligibility:  Current and active since 12/31/14, $350 deductible with required co-insurance of 15% to max OOP $6000, then plan will pay 100% of allowable charges.  Transaction ID: [id number]  Per Drinda Butts with [employer] [id number] Prior auth not required for LV Cors [id number], poss PCI [phone].  REF# [id number]

## 2022-01-03 ENCOUNTER — Encounter: Admit: 2022-01-03 | Discharge: 2022-01-03 | Payer: BC Managed Care – PPO

## 2022-01-03 DIAGNOSIS — G479 Sleep disorder, unspecified: Secondary | ICD-10-CM

## 2022-01-03 NOTE — Progress Notes
Sleep study is negative for any significant sleep disordered breathing.   Consider referral to sleep clinic if persistent sleep related symptoms.      Sleep study report should be uploaded in EPIC soon, please see the final report for details.

## 2022-01-07 ENCOUNTER — Encounter: Admit: 2022-01-07 | Discharge: 2022-01-07 | Payer: BC Managed Care – PPO

## 2022-01-08 ENCOUNTER — Encounter: Admit: 2022-01-08 | Discharge: 2022-01-08 | Payer: BC Managed Care – PPO

## 2022-01-08 NOTE — Telephone Encounter
I explained to him his HST showed no significant OSA or desaturations so no need to restart CPAP therapy and congratulated him on his weight loss which cured his apnea.  He is still taking Ambien 5 mg most nights and has very poor sleep when he does not so will continue as needed and no follow up with our team for now.

## 2022-01-08 NOTE — Telephone Encounter
Future appointment cancelled.

## 2022-01-10 ENCOUNTER — Encounter: Admit: 2022-01-10 | Discharge: 2022-01-10 | Payer: BC Managed Care – PPO

## 2022-01-10 ENCOUNTER — Ambulatory Visit: Admit: 2022-01-10 | Discharge: 2022-01-10 | Payer: BC Managed Care – PPO

## 2022-01-10 DIAGNOSIS — R931 Abnormal findings on diagnostic imaging of heart and coronary circulation: Secondary | ICD-10-CM

## 2022-01-10 DIAGNOSIS — E782 Mixed hyperlipidemia: Secondary | ICD-10-CM

## 2022-01-10 DIAGNOSIS — K21 Gastroesophageal reflux disease with esophagitis without hemorrhage: Secondary | ICD-10-CM

## 2022-01-10 DIAGNOSIS — Z01810 Encounter for preprocedural cardiovascular examination: Secondary | ICD-10-CM

## 2022-01-10 DIAGNOSIS — G4730 Sleep apnea, unspecified: Secondary | ICD-10-CM

## 2022-01-10 DIAGNOSIS — H269 Unspecified cataract: Secondary | ICD-10-CM

## 2022-01-10 DIAGNOSIS — R519 Generalized headaches: Secondary | ICD-10-CM

## 2022-01-10 DIAGNOSIS — M1990 Unspecified osteoarthritis, unspecified site: Secondary | ICD-10-CM

## 2022-01-10 DIAGNOSIS — IMO0002 Ulcer: Secondary | ICD-10-CM

## 2022-01-10 DIAGNOSIS — I10 Essential (primary) hypertension: Secondary | ICD-10-CM

## 2022-01-10 DIAGNOSIS — F419 Anxiety disorder, unspecified: Secondary | ICD-10-CM

## 2022-01-10 DIAGNOSIS — M2550 Pain in unspecified joint: Secondary | ICD-10-CM

## 2022-01-10 DIAGNOSIS — M2560 Stiffness of unspecified joint, not elsewhere classified: Secondary | ICD-10-CM

## 2022-01-10 DIAGNOSIS — F5101 Primary insomnia: Secondary | ICD-10-CM

## 2022-01-10 DIAGNOSIS — E785 Hyperlipidemia, unspecified: Secondary | ICD-10-CM

## 2022-01-10 DIAGNOSIS — F32A Depression: Secondary | ICD-10-CM

## 2022-01-10 DIAGNOSIS — Z0000 Encounter for general adult medical examination without abnormal findings: Secondary | ICD-10-CM

## 2022-01-10 LAB — CBC AND DIFF
ABSOLUTE BASO COUNT: 0 K/UL (ref >60)
ABSOLUTE LYMPH COUNT: 1.1 K/UL (ref 1.0–4.8)
ABSOLUTE NEUTROPHIL: 2.6 K/UL — ABNORMAL HIGH (ref 1.8–7.0)
HEMATOCRIT: 36 % — ABNORMAL LOW (ref 40–50)
HEMOGLOBIN: 12 g/dL — ABNORMAL LOW (ref 13.5–16.5)
MCHC: 34 g/dL (ref 32.0–36.0)
MONOCYTES %: 16 % — ABNORMAL HIGH (ref 4–12)
PLATELET COUNT: 217 K/UL (ref 150–400)
RBC COUNT: 3.9 M/UL — ABNORMAL LOW (ref 4.4–5.5)
WBC COUNT: 4.9 K/UL (ref 4.5–11.0)

## 2022-01-10 LAB — COMPREHENSIVE METABOLIC PANEL
ALBUMIN: 4.4 g/dL (ref 3.5–5.0)
ALK PHOSPHATASE: 88 U/L (ref 25–110)
ALT: 93 U/L — ABNORMAL HIGH (ref 7–56)
ANION GAP: 7 K/UL (ref 3–12)
CALCIUM: 9.7 mg/dL (ref 8.5–10.6)
CREATININE: 0.8 mg/dL — ABNORMAL LOW (ref 0.4–1.24)
GLUCOSE,PANEL: 99 mg/dL (ref 70–100)
POTASSIUM: 5 MMOL/L (ref 3.5–5.1)
SODIUM: 139 MMOL/L (ref 137–147)
TOTAL PROTEIN: 7.3 g/dL (ref 6.0–8.0)

## 2022-01-10 NOTE — Progress Notes
Date of Service: 01/10/2022    Jared Hebert is a 71 y.o. male.       HPI     Jared Hebert is a 71 y.o. male with a past medical history of coronary artery disease with a calcium score 315, hypertension, and dyslipidemia with statin intolerance.  He followed with Dr. Shela Leff last seen in September 2022. He had an echocardiogram and regadenoson stress testing on October 25, 2021 that revealed left ventricular ejection fraction of 60 to 65%, no wall motion abnormalities or significant valvular disease.  His stress test revealed transient ischemic dilation that was new from his previous studies.  He proceeded with a coronary CT angiogram on December 04, 2021 that revealed moderate to severe three-vessel coronary disease involving the circumflex, LAD and diagonal arteries.  Due to these findings, it is recommended that he proceed with a coronary angiogram to further define his anatomy and he presents the office today for an updated history and physical prior to his procedure on January 13, 2022.     He comes alone to clinic today and is doing fairly well from a cardiovascular standpoint.  Around Thanksgiving, he developed shingles on his left arm, chest and back.  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  He denies any shortness of breath, lightheadedness, dizziness, palpitations or lower extremity swelling.       Vitals:    01/10/22 1410   BP: 128/76   BP Source: Arm, Left Upper   Pulse: 70   SpO2: 95%   O2 Device: None (Room air)   PainSc: Zero   Weight: 82.6 kg (182 lb)   Height: 172.7 cm (5' 8)     Body mass index is 27.67 kg/m?Marland Kitchen     Past Medical History  Patient Active Problem List    Diagnosis Date Noted   ? 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 Iron deficiency anemia 01/03/2020   ? Chronic left-sided low back pain without sciatica 12/20/2019   ? 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 Anxiety and depression 09/05/2019   ? Contact w and exposure to oth viral communicable diseases 08/04/2019   ? 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 GERD (gastroesophageal reflux disease) 08/04/2019   ? Obesity (BMI 30.0-34.9) 08/04/2019   ? OSA (obstructive sleep apnea) 08/04/2019     He has a history of obstructive sleep apnea diagnosed around 2010 and did well on CPAP therapy for around 6 to 7 years.  However he has lost more than 50 pounds since that time and no longer snores at night.     ? Palpitations 08/04/2019   ? Insomnia 08/04/2019     He has a long history of insomnia with mainly difficulty falling asleep.  He was started on Ambien 5 mg about 2020.  He currently takes it about half the nights of the week and does find it helpful.  He denies any side effects.     ? Mixed hyperlipidemia 07/19/2019   ? Hypertension, essential 07/19/2019   ? 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 Dry  eye syndrome of both eyes 10/14/2017     0.7 plugs OU 11/26/17     ? Astigmatism of right eye 03/24/2017   ? Cataract, right eye 03/24/2017   ? Astigmatism of left eye 03/10/2017   ? Cataract, left eye 03/09/2017         Review of Systems   Constitutional: Negative.   HENT: Negative.    Eyes: Negative.    Cardiovascular: Negative.    Respiratory: Negative.    Endocrine: Negative.    Hematologic/Lymphatic: Negative.    Skin: Negative.    Musculoskeletal: Negative.    Gastrointestinal: Negative.    Genitourinary: Negative.    Neurological: Negative.    Psychiatric/Behavioral: Negative.    Allergic/Immunologic: Negative.        Physical Exam  General Appearance: no acute distress  Skin: warm & intact  HEENT: unremarkable  Neck Veins: neck veins are flat & not distended  Carotid Arteries: no bruits  Chest Inspection: chest is normal in appearance  Auscultation/Percussion: lungs clear to auscultation, no rales, rhonchi, or wheezing  Cardiac Rhythm: regular rhythm & normal rate  Cardiac Auscultation: Normal S1 & S2,.  Murmurs: no cardiac murmurs   Extremities: no lower extremity edema; 2+ symmetric distal pulses  Abdominal Exam: soft, non-tender, no masses, bowel sounds normal  Neurologic Exam: oriented to time, place and person; no focal neurologic deficits  Psychiatric: Normal mood and affect.  Behavior is normal. Judgment and thought content normal.       Cardiovascular Studies  Preliminary EKG:    Most recent results for 12-Lead ECG   ECG 12-LEAD    Collection Time: 01/10/22  2:07 PM   Result Value Status    VENTRICULAR RATE 70 Incomplete    P-R INTERVAL 202 Incomplete    QRS DURATION 80 Incomplete    Q-T INTERVAL 380 Incomplete    QTC CALCULATION (BAZETT) 410 Incomplete    P AXIS 53 Incomplete    R AXIS 36 Incomplete    T AXIS 32 Incomplete    Impression    Normal sinus rhythm  Normal ECG  When compared with ECG of 16-Sep-2021 09:29,  No significant change was found         Cardiovascular Health Factors  Vitals BP Readings from Last 3 Encounters:   01/10/22 128/76   11/10/21 (!) 142/90   10/25/21 137/84     Wt Readings from Last 3 Encounters:   01/10/22 82.6 kg (182 lb)   11/10/21 81.8 kg (180 lb 6.4 oz)   10/25/21 83.3 kg (183 lb 9.6 oz)     BMI Readings from Last 3 Encounters:   01/10/22 27.67 kg/m?   11/10/21 27.43 kg/m?   10/25/21 27.11 kg/m?      Smoking Social History     Tobacco Use   Smoking Status Former   ? Packs/day: 1.00   ? Years: 10.00   ? Pack years: 10.00   ? Types: Cigarettes   ? Start date: 08/04/1967   ? Quit date: 2008   ? Years since quitting: 15.0   Smokeless Tobacco Former   Tobacco Comments    15 + yrs since last smoked      Lipid Profile Cholesterol   Date Value Ref Range Status   09/16/2021 99 <200 MG/DL Final     HDL   Date Value Ref Range Status   09/16/2021 46 >40 MG/DL Final     LDL   Date Value Ref Range Status   09/16/2021 39 <100  mg/dL Final     Triglycerides   Date Value Ref Range Status   09/16/2021 91 <150 MG/DL Final      Blood Sugar Hemoglobin A1C   Date Value Ref Range Status   09/16/2021 5.5 4.0 - 6.0 % Final     Comment:     The ADA recommends that most patients with type 1 and type 2 diabetes maintain   an A1c level <7%.       Glucose   Date Value Ref Range Status   09/16/2021 100 70 - 100 MG/DL Final   16/09/9603 540 70 - 100 MG/DL Final   98/10/9146 829 (H) 70 - 100 MG/DL Final          Problems Addressed Today  Encounter Diagnoses   Name Primary?   ? Preop cardiovascular exam Yes   ? Mixed hyperlipidemia    ? Hypertension, essential    ? 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        Assessment and Plan     1.  Coronary artery disease.  He has a calcium score of 315.  When he saw Dr. Vivianne Spence last, he ordered an echocardiogram and regadenoson stress testing that did reveal transient ischemic dilation.  He proceeded with a coronary CTA that was abnormal with moderate to severe three-vessel disease involving the circumflex LAD and diagonal artery.  He is scheduled for a coronary angiogram on January 13, 2022 for further evaluation.  He will have preprocedure lab work drawn today before he leaves office.  He has received his preprocedure instructions and arrival time with no further questions.    2.  Hypertension.  Blood pressure is well controlled today in the office, no changes made today.  He remains on losartan 25 mg daily.    3.  Dyslipidemia.  His last blood panel was in September 2022 with a total cholesterol 99, triglycerides 91, HDL 46 and LDL 39.  He is on rosuvastatin 20 mg daily and Zetia 10 mg daily.    He will proceed with his procedure as scheduled next week.  He was previously seen by Dr. Vivianne Spence and wishes to establish with a cardiologist at the [facility] office.    Judieth Keens, APRN-NP      Current Medications (including today's revisions)  ? ascorbic acid (VITAMIN C) 500 mg tablet Take 500 mg by mouth daily.   ? aspirin EC 81 mg tablet Take 81 mg by mouth daily. Take with food.   ? cetirizine (ZYRTEC) 10 mg tablet Take 10 mg by mouth.   ? citalopram (CELEXA) 40 mg tablet Take one tablet by mouth daily.   ? cyclobenzaprine (FLEXERIL) 5 mg tablet Take 1-2 tablets by mouth daily as needed.   ? ezetimibe (ZETIA) 10 mg tablet TAKE 1 TABLET BY MOUTH EVERY DAY   ? fluticasone propionate (FLONASE) 50 mcg/actuation nasal spray, suspension Apply 1 spray to each nostril as directed daily. Shake bottle gently before using.   ? folic acid (FOLVITE) 1 mg tablet TAKE 1 TABLET BY MOUTH EVERY DAY   ? gabapentin (NEURONTIN) 300 mg capsule TAKE 1 CAPSULE BY MOUTH EVERY 8 HOURS (Patient taking differently: Take 300 mg by mouth at bedtime daily.)   ? GLUCOSAMINE SULFATE PO Take 1,500 mg by mouth once.   ? losartan (COZAAR) 25 mg tablet Take one tablet by mouth daily.   ? meloxicam (MOBIC) 15 mg tablet Take one tablet by mouth daily.   ? rosuvastatin (CRESTOR)  20 mg tablet TAKE 1 TABLET BY MOUTH EVERY DAY   ? zolpidem (AMBIEN) 5 mg tablet Take one tablet by mouth at bedtime as needed for Sleep.

## 2022-01-10 NOTE — Patient Instructions
No changes made to your medications today, continue taking all as prescribed.       Have your labs drawn today.     Proceed with your procedure on Monday!

## 2022-01-13 ENCOUNTER — Encounter: Admit: 2022-01-13 | Discharge: 2022-01-13 | Payer: BC Managed Care – PPO

## 2022-01-13 DIAGNOSIS — R519 Generalized headaches: Secondary | ICD-10-CM

## 2022-01-13 DIAGNOSIS — M2560 Stiffness of unspecified joint, not elsewhere classified: Secondary | ICD-10-CM

## 2022-01-13 DIAGNOSIS — M1990 Unspecified osteoarthritis, unspecified site: Secondary | ICD-10-CM

## 2022-01-13 DIAGNOSIS — G4730 Sleep apnea, unspecified: Secondary | ICD-10-CM

## 2022-01-13 DIAGNOSIS — R9430 Abnormal result of cardiovascular function study, unspecified: Secondary | ICD-10-CM

## 2022-01-13 DIAGNOSIS — F32A Depression: Secondary | ICD-10-CM

## 2022-01-13 DIAGNOSIS — IMO0002 Ulcer: Secondary | ICD-10-CM

## 2022-01-13 DIAGNOSIS — I10 Essential (primary) hypertension: Secondary | ICD-10-CM

## 2022-01-13 DIAGNOSIS — M2550 Pain in unspecified joint: Secondary | ICD-10-CM

## 2022-01-13 DIAGNOSIS — H269 Unspecified cataract: Secondary | ICD-10-CM

## 2022-01-13 DIAGNOSIS — E785 Hyperlipidemia, unspecified: Secondary | ICD-10-CM

## 2022-01-13 MED ADMIN — SODIUM CHLORIDE 0.9 % IV SOLP [27838]: 500 mL | INTRAVENOUS | @ 14:00:00 | Stop: 2022-01-13 | NDC 00338004904

## 2022-01-15 ENCOUNTER — Encounter: Admit: 2022-01-15 | Discharge: 2022-01-15 | Payer: BC Managed Care – PPO

## 2022-01-15 DIAGNOSIS — R7401 Transaminitis: Secondary | ICD-10-CM

## 2022-01-15 NOTE — Progress Notes
Pt hospitalized since last office visit: No    Health Maintenance Due   Topic Date Due    HEPATITIS C SCREENING  Never done    SHINGLES RECOMBINANT VACCINE (1 of 2) Never done    COVID-19 VACCINE (4 - Booster for Moderna series) 01/02/2021    INFLUENZA VACCINE (1) 07/22/2021       There are no diagnoses linked to this encounter.    Labs not done:      Lab Frequency Next Occurrence   CBC AND DIFF Once 06/18/2021   BASIC METABOLIC PANEL Once 06/18/2021   LIPID PROFILE Once 06/18/2021   LIVER FUNCTION PANEL Once 06/18/2021   SLEEP STUDY Once 06/27/2021   CT CHEST WO CONTRAST Once 09/16/2021   REQUEST FOR CARDIOLOGY APPOINTMENT Once 09/16/2022   HEMOGLOBIN A1C Once 01/16/2022   LIPID PROFILE Once 01/16/2022   LIVER FUNCTION PANEL Once 01/16/2022   TSH WITH FREE T4 REFLEX Once 01/16/2022   LIPID PROFILE Once 03/16/2022   CT COR FFR IF INDICATED Once 11/04/2021   REQUEST FOR CARDIOLOGY APPOINTMENT Once 07/10/2022   REQUEST FOR CARDIOLOGY APPOINTMENT Once 04/13/2022       No labs due at this time.  Shingles appt    Notes to provider:

## 2022-01-20 ENCOUNTER — Ambulatory Visit: Admit: 2022-01-20 | Discharge: 2022-01-20 | Payer: BC Managed Care – PPO

## 2022-01-20 ENCOUNTER — Encounter: Admit: 2022-01-20 | Discharge: 2022-01-20 | Payer: BC Managed Care – PPO

## 2022-01-20 DIAGNOSIS — E782 Mixed hyperlipidemia: Secondary | ICD-10-CM

## 2022-01-20 DIAGNOSIS — M2560 Stiffness of unspecified joint, not elsewhere classified: Secondary | ICD-10-CM

## 2022-01-20 DIAGNOSIS — K21 Gastroesophageal reflux disease with esophagitis without hemorrhage: Secondary | ICD-10-CM

## 2022-01-20 DIAGNOSIS — R9430 Abnormal result of cardiovascular function study, unspecified: Secondary | ICD-10-CM

## 2022-01-20 DIAGNOSIS — IMO0002 Ulcer: Secondary | ICD-10-CM

## 2022-01-20 DIAGNOSIS — R7301 Impaired fasting glucose: Secondary | ICD-10-CM

## 2022-01-20 DIAGNOSIS — M1990 Unspecified osteoarthritis, unspecified site: Secondary | ICD-10-CM

## 2022-01-20 DIAGNOSIS — M2550 Pain in unspecified joint: Secondary | ICD-10-CM

## 2022-01-20 DIAGNOSIS — I10 Essential (primary) hypertension: Secondary | ICD-10-CM

## 2022-01-20 DIAGNOSIS — M545 Chronic left-sided low back pain without sciatica: Secondary | ICD-10-CM

## 2022-01-20 DIAGNOSIS — R519 Generalized headaches: Secondary | ICD-10-CM

## 2022-01-20 DIAGNOSIS — R931 Abnormal findings on diagnostic imaging of heart and coronary circulation: Secondary | ICD-10-CM

## 2022-01-20 DIAGNOSIS — Z23 Encounter for immunization: Secondary | ICD-10-CM

## 2022-01-20 DIAGNOSIS — E785 Hyperlipidemia, unspecified: Secondary | ICD-10-CM

## 2022-01-20 DIAGNOSIS — F5101 Primary insomnia: Secondary | ICD-10-CM

## 2022-01-20 DIAGNOSIS — F419 Anxiety disorder, unspecified: Secondary | ICD-10-CM

## 2022-01-20 DIAGNOSIS — H269 Unspecified cataract: Secondary | ICD-10-CM

## 2022-01-20 DIAGNOSIS — Z0000 Encounter for general adult medical examination without abnormal findings: Secondary | ICD-10-CM

## 2022-01-20 DIAGNOSIS — F32A Depression, unspecified: Secondary | ICD-10-CM

## 2022-01-20 DIAGNOSIS — G4730 Sleep apnea, unspecified: Secondary | ICD-10-CM

## 2022-01-20 DIAGNOSIS — Z1159 Encounter for screening for other viral diseases: Secondary | ICD-10-CM

## 2022-01-20 MED ORDER — ZOLPIDEM 5 MG PO TAB
5 mg | ORAL_TABLET | Freq: Every evening | ORAL | 5 refills | 30.00000 days | Status: AC | PRN
Start: 2022-01-20 — End: ?

## 2022-01-20 NOTE — Progress Notes
Pt received bIVALENT during ov in the Lt deltoid. Pt tolerated well. Verified name and DOB. No further questions or concerns.

## 2022-01-20 NOTE — Progress Notes
Date of Service: 01/20/2022    Jared Hebert is a 71 y.o. male. DOB: 1951/11/08   MRN#: [id number]    Subjective: follow up       Back Pain  This is a chronic problem. The current episode started more than 1 month ago. The problem occurs intermittently. The pain is at a severity of 4/10. The pain is moderate. The pain is worse during the day. The symptoms are aggravated by bending and standing. Pertinent negatives include no abdominal pain, chest pain, dysuria, fever, headaches, numbness, weakness or weight loss. He has tried chiropractic manipulation and NSAIDs for the symptoms. The treatment provided no relief.   Hypertension  This is a chronic problem. The problem is controlled. Pertinent negatives include no anxiety, blurred vision, chest pain, headaches, malaise/fatigue, neck pain, orthopnea, palpitations or shortness of breath.   Patient Reported Other    Jared Hebert is 71 y.o. patient who presents to clinic for follow up. He established care 07/19/2019. He is a Engineer, civil (consulting) in [place]. No smoking, no alcohol, no street drugs. No regular exercise. He has 4 children.     He was last seen 08/2021    He had Rt should er surgery 11/2020, 04/2021 and again in 04/2021 at [facility].     He saw sleep clinic 05/2021    He saw Cardiology 09/16/21 and 11/2021:  He had inconclusive coronary CT study and stress test so he ended with cardiac cath a/23/23 which showed no critical stenosis.     He had left arm and left chest wall shingles in 10/2021.     He had upset stomach recently similar to when he GI bleed before. No blood in stool. He had a bleeding ulcer before so I advised him to talk to his outside GI doctor to get another EGD. His Hb dropped from 13.7 on 09/16/21 to 12.6 on 01/10/22     He was in clinic for low back pain for 6 weeks as above. 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   He was referred by cardiology to GI because of anemia. He did EGD and colonscopy 01/25/2020 and was found to have a non-bleeding gastric ulcer 15mm that was biopsied and is scheduled for repeat EGD in 2 months. Colonscopy was normal. Results of scopes are sent for scanning. I told him to stop all  NSAID and Mobic at this time. He had a repeat EGD 05/07/2020 by his outside GI doctor and was reported that gastric ulcer has healed.      His calcium score was 315. He saw cardiology 12/2019. He had CTA chest which was normal but There was incidental cholelithiasis and a right renal lesion that will require ultrasound for further definition.     He had a normal stress test 11/09/2020  He failed oral iron so cardiology ordered IV Iron.     He had labs drawn 01/13/22    He had abdomen US 01/30/2020 which showed:  Small hypoechoic exophytic right renal nodule most compatible with a   hemorrhagic cyst   Cholelithiasis   Mild bladder wall thickening, nonspecific but most likely secondary to   bladder outlet stenosis or neurogenic bladder     He managed to lose intentionally 55 Lb over last 1 year by having ketogenic diet. He takes multivitamin daily.    He also struggles with depression but no intent to harm himself or others. His brother had depression and  committed suicide in 2003.       Medical History:   Diagnosis Date   ? Abnormal cardiac function test 01/13/2022   ? Arthritis     back & shoulders   ? Cataract 5 Years ago   ? Depression 20+ Yrs   ? Generalized headaches Most of adult life   ? Hyperlipidemia    ? Hypertension    ? Joint pain 10 yrs ago   ? Joint stiffness 2009   ? Sleep apnea    ? Ulcer 12/2019       Surgical History:   Procedure Laterality Date   ? VASECTOMY  1984   ? HX CATARACT REMOVAL Bilateral 02/2017    [facility]   ? MENISCECTOMY Left 2019   ? PARS PLANA MECHANICAL VITRECTOMY WITH  Kenolog Right 09/02/2018    Performed by Blair Hailey, MD at [facility] OR   ? PARS PLANA MECHANICAL VITRECTOMY Left 11/04/2018    Performed by Blair Hailey, MD at [facility] OR   ? ANGIOGRAPHY CORONARY ARTERY WITH LEFT HEART CATHETERIZATION N/A 01/13/2022    Performed by Marcell Barlow, MD at [facility] CATH LAB   ? POSSIBLE PERCUTANEOUS CORONARY STENT PLACEMENT WITH ANGIOPLASTY N/A 01/13/2022    Performed by Marcell [facility]low[facility] at [facility] CATH LAB   ? HX ADENOIDECTOMY     ? HX ARTHROSCOPIC SURGERY  4 yrs ago    Left Knee Scope   ? HX TONSILLECTOMY     ? KNEE SURGERY  Left Knee Scope 4 yrs ago   ? NASAL SURGERY      TWICE       family history includes Arthritis in his father and mother; Back pain in his brother, father, and mother; Cataract in his father and mother; Depression in his brother; Heart Disease in his father; Heart problem in his father; Hypertension in his father; Joint Pain in his brother, father, and mother; Osteoporosis in his mother.    Social History     Socioeconomic History   ? Marital status: Married     Spouse name: Bonita Quin   Tobacco Use   ? Smoking status: Former     Packs/day: 1.00     Years: 10.00     Pack years: 10.00     Types: Cigarettes     Start date: 08/04/1967     Quit date: 2008     Years since quitting: 15.0   ? Smokeless tobacco: Former   ? Tobacco comments:     15 + yrs since last smoked   Vaping Use   ? Vaping Use: Never used   Substance and Sexual Activity   ? Alcohol use: Not Currently     Alcohol/week: 2.0 standard drinks     Types: 2 Cans of beer per week     Comment: Social drinker   ? Drug use: Never   ? Sexual activity: Yes     Partners: Female     Birth control/protection: None       Social History     Tobacco Use   ? Smoking status: Former     Packs/day: 1.00     Years: 10.00     Pack years: 10.00     Types: Cigarettes     Start date: 08/04/1967     Quit date: 2008     Years since quitting: 15.0   ? Smokeless tobacco: Former   ? Tobacco comments:     15 + yrs since  last smoked   Substance Use Topics   ? Alcohol use: Not Currently     Alcohol/week: 2.0 standard drinks     Types: 2 Cans of beer per week     Comment: Social drinker        Review of Systems   Constitutional: Negative for chills, diaphoresis, fever, malaise/fatigue, night sweats, weight gain and weight loss.   HENT: Negative for congestion, sore throat and stridor.    Eyes: Negative for blurred vision.   Cardiovascular: Negative for chest pain, orthopnea, palpitations and syncope.   Respiratory: Negative for cough, shortness of breath, snoring and wheezing.    Skin: Negative for rash.   Musculoskeletal: Positive for back pain and myalgias. Negative for joint pain, joint swelling and neck pain.   Gastrointestinal: Negative for abdominal pain, anorexia, change in bowel habit, diarrhea, nausea and vomiting.   Genitourinary: Negative for dysuria, flank pain and hesitancy.   Neurological: Negative for disturbances in coordination, excessive daytime sleepiness, dizziness, focal weakness, headaches, numbness, seizures, vertigo and weakness.   Psychiatric/Behavioral: Negative for depression, hallucinations and memory loss. The patient is not nervous/anxious.    Allergic/Immunologic: Negative for hives.   All other systems reviewed and are negative.      Objective:     ? ascorbic acid (VITAMIN C) 500 mg tablet Take 500 mg by mouth daily.   ? aspirin EC 81 mg tablet Take 81 mg by mouth daily. Take with food.   ? cetirizine (ZYRTEC) 10 mg tablet Take 10 mg by mouth.   ? citalopram (CELEXA) 40 mg tablet Take one tablet by mouth daily.   ? cyclobenzaprine (FLEXERIL) 5 mg tablet Take 1-2 tablets by mouth daily as needed.   ? ezetimibe (ZETIA) 10 mg tablet TAKE 1 TABLET BY MOUTH EVERY DAY   ? fluticasone propionate (FLONASE) 50 mcg/actuation nasal spray, suspension Apply 1 spray to each nostril as directed daily. Shake bottle gently before using.   ? folic acid (FOLVITE) 1 mg tablet TAKE 1 TABLET BY MOUTH EVERY DAY   ? gabapentin (NEURONTIN) 300 mg capsule TAKE 1 CAPSULE BY MOUTH EVERY 8 HOURS (Patient taking differently: Take 300 mg by mouth at bedtime daily.)   ? GLUCOSAMINE SULFATE PO Take 1,500 mg by mouth once.   ? losartan (COZAAR) 25 mg tablet Take one tablet by mouth daily.   ? meloxicam (MOBIC) 15 mg tablet Take one tablet by mouth daily.   ? omeprazole DR (PRILOSEC) 40 mg capsule Take 40 mg by mouth daily.   ? rosuvastatin (CRESTOR) 20 mg tablet TAKE 1 TABLET BY MOUTH EVERY DAY   ? zolpidem (AMBIEN) 5 mg tablet Take one tablet by mouth at bedtime as needed for Sleep.     Vitals:    01/20/22 1528   BP: (P) 130/72   Pulse: (P) 73   Resp: (P) 15   SpO2: (P) 99%   PainSc: Zero   Weight: 82.1 kg (181 lb)   Height: 172.7 cm (5' 8)     Body mass index is 27.52 kg/m?Marland Kitchen     Physical Exam  Vitals and nursing note reviewed.   Constitutional:       General: He is not in acute distress.     Appearance: He is well-developed. He is not diaphoretic.   HENT:      Head: Normocephalic and atraumatic.      Nose: Nose normal.   Eyes:      General: No scleral icterus.  Right eye: No discharge.         Left eye: No discharge.      Conjunctiva/sclera: Conjunctivae normal.      Pupils: Pupils are equal, round, and reactive to light.   Neck:      Thyroid: No thyromegaly.      Vascular: No JVD.      Trachea: No tracheal deviation.   Cardiovascular:      Rate and Rhythm: Normal rate and regular rhythm.      Heart sounds: Normal heart sounds. No murmur heard.    No friction rub.   Pulmonary:      Effort: Pulmonary effort is normal. No respiratory distress.      Breath sounds: Normal breath sounds. No wheezing or rales.   Chest:      Chest wall: No tenderness.   Abdominal:      General: There is no distension.      Palpations: Abdomen is soft. There is no mass.      Tenderness: There is no abdominal tenderness. There is no guarding or rebound.   Musculoskeletal:         General: Normal range of motion.      Cervical back: Normal range of motion and neck supple.   Lymphadenopathy:      Cervical: No cervical adenopathy.   Skin:     General: Skin is warm and dry.   Neurological:      Mental Status: He is alert and oriented to person, place, and time.      Cranial Nerves: No cranial nerve deficit. Psychiatric:         Mood and Affect: Mood normal.         Behavior: Behavior normal.         Thought Content: Thought content normal.         Judgment: Judgment normal.          Assessment and Plan:         Chronic low back pain for few weeks:  He went to chiroparcter and took NSAID and did home exercises  With his gastric ulcer, NO NSAID or MOBIC  He is seeing PT at [place]  We referred to PM&R again  He had ablation  He saw spine clinic 05/2020  Doing well  No complaints    Rt shoulder injury at work:  He had surgery 11/2020, and 04/2021  Stable    Gastric ulcer:  He is in clinic for low back pain for 6 weeks as above. 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   He was referred by cardiology to GI because of anemia. He did EGD and colonscopy 01/25/2020 and was found to have a non-bleeding gastric ulcer 15mm that was biopsied and is scheduled for repeat EGD in 2 months. Colonscopy was normal. Results of scopes are sent for scanning. I told him to stop all  NSAID and Mobic at this time  He failed oral iron so cardiology ordered IV Iron.   He is scheduled to repeat EGD 05/07/20. I told him to restart Mobic with food if EGD is back to normal.   He had a repeat EGD 05/07/2020 by his outside GI doctor and was reported that gastric ulcer has healed.    Stable. Avoid NSAID  He had upset stomach recently similar to when he GI bleed before. No blood in stool. He had a bleeding ulcer before so I advised  him to talk to his outside GI doctor to get another EGD. His Hb dropped from 13.7 on 09/16/21 to 12.6 on 01/10/22     Shingles:  He had left arm and left chest wall shingles in 10/2021.     Hypertension:  He is on HCTZ and Losarten  BP Readings from Last 3 Encounters:   01/20/22 (P) 130/72   01/13/22 (!) 129/92   01/10/22 128/76   continue  He had stress test and echo in 02/2016 that were reported as normal and I reviewed in care Every where  We ordered CT calcium score which he did 08/2019  Stable    Coronary calcification:  CT calcium scan 08/2019:  Quantitative Coronary Calcium Score:   Left main: 0   LAD: 114   LCX: 144   RCA: 5 7   Total calcium score: 315   He saw cardiology and had echo and normal stress test 10/2019 and has a follow up 12/2019 and 09/16/21  He saw Cardiology 09/16/21 and 11/2021:  He had inconclusive coronary CT study and stress test so he ended with cardiac cath a/23/23 which showed no critical stenosis.     Renal mass:  His calcium score was 315. He saw cardiology 12/2019. He had CTA chest which was normal but There was incidental cholelithiasis and a right renal lesion that will require ultrasound for further definition.     He had a normal stress test 11/09/2020  He had abdomen US 01/30/2020 which showed:  Small hypoechoic exophytic right renal nodule most compatible with a   hemorrhagic cyst   Cholelithiasis   Mild bladder wall thickening, nonspecific but most likely secondary to   bladder outlet stenosis or neurogenic bladder   Stable    Hyperlipidemia:  He is on Zocor 20mg  daily but did not take for 30 days. He had cramps with Lipitor.  We increased Zocor to 40mg  daily 07/19/2019  Cardiology switched him to Crestor 10mg  daily and Zetia  Plan on repeat labs in 3 months  Lab Results   Component Value Date    CHOL 112 01/13/2022    TRIG 116 01/13/2022    HDL 42 01/13/2022    LDL 49 01/13/2022    VLDL 23 01/13/2022    NONHDLCHOL 70 01/13/2022      Anxiety and Depression:   He had for 20 years  He is on Effexor and Remeron  He is not feeling that this combination is helping  He is homicidal or suicidal but his brother committed suicide in 2013  We got him to taper off his medications over2 weeks and we he started Celexa 20mg  daily after he tapered off  We referred to psychologist  I am referring this patient to the behavior health consultant for the following conditions:  Health Risk Behaviors:  Stress management  Mental Health Conditions:  Depression and Anxiety  We increased Celexa from 20 to 40mg  daily back in 08/2019  He is doing better    History of sleep apnea:  He is not snoring after he lost weight  He struggles with staying sleep and Ambien did not help  He saw sleep clinic 05/2021  Refill    Weakness:  Normal labs and TSH and B12 level   He is on ketogenic diet    Hearing deficits:  He has hearing aids    Routine health maintenance:    Colonoscopy: 2014, normal  Tdap: 2015  Pneumovax: Prevnar 07/19/2019 and plan on pneumovax in a year  Influenza:09/2018, 10/2019, 11/2021  Eye exam: 2019  Dental exam: 2019  AAA screening: 07/2019  He had COVID vaccine 10/2020, and today 01/20/22    Return to clinic in 4 months with labs    Total of 40 minutes were spent on the same day of the visit including preparing to see the patient, obtaining and/or reviewing separately obtained history, performing a medically appropriate examination and/or evaluation, counseling and educating the patient/family/caregiver, ordering medications, tests, or procedures, referring and communication with other health care professionals, documenting clinical information in the electronic or other health record, independently interpreting results and communicating results to the patient/family/caregiver, and care coordination.   I Counseled patient regarding low back pain, depression and weakness. We discussed safety during COVID19 pandemic.     Orders Placed This Encounter   ? Pfizer (COVID-19) SARS-CoV-2 BIVALENT BOOSTER (12+ y/o)   ? CBC AND DIFF   ? BASIC METABOLIC PANEL   ? HEMOGLOBIN A1C   ? LIPID PROFILE   ? LIVER FUNCTION PANEL   ? MICROALB/CR RATIO-URINE RANDOM   ? TSH WITH FREE T4 REFLEX   ? zolpidem (AMBIEN) 5 mg tablet     Encounter Medications   Medications   ? omeprazole DR (PRILOSEC) 40 mg capsule     Sig: Take 40 mg by mouth daily.   ? zolpidem (AMBIEN) 5 mg tablet     Sig: Take one tablet by mouth at bedtime as needed for Sleep.     Dispense:  30 tablet     Refill:  5     Please use GoodRx coupon Future Appointments   Date Time Provider Department Center   03/18/2022  9:00 AM Al-Hihi, Roderic Scarce, MD [facility]     There are no Patient Instructions on file for this visit.

## 2022-02-17 ENCOUNTER — Encounter: Admit: 2022-02-17 | Discharge: 2022-02-17 | Payer: BC Managed Care – PPO

## 2022-02-17 NOTE — Telephone Encounter
===  View-only below this line===  ----- Message -----  From: Fransico MichaelMiranda, Gabrielle, RN  Sent: 02/15/2022  12:00 AM CST  To: [facility] Nurse Gen Card Team Gold  Subject: Needs repeat CMP for transaminitis - orders *

## 2022-03-16 ENCOUNTER — Encounter: Admit: 2022-03-16 | Discharge: 2022-03-16 | Payer: BC Managed Care – PPO

## 2022-03-16 NOTE — Telephone Encounter
===  View-only below this line===  ----- Message -----  From: Loleta Chance, RN  Sent: 03/16/2022  12:00 AM CDT  To: [facility] Nurse Gen Card Team Gold  Subject: Needs 71mo labs/lipids

## 2022-03-22 ENCOUNTER — Encounter: Admit: 2022-03-22 | Discharge: 2022-03-22 | Payer: BC Managed Care – PPO

## 2022-03-22 ENCOUNTER — Ambulatory Visit: Admit: 2022-03-22 | Discharge: 2022-03-22 | Payer: BC Managed Care – PPO

## 2022-03-22 DIAGNOSIS — F419 Anxiety disorder, unspecified: Secondary | ICD-10-CM

## 2022-03-22 DIAGNOSIS — Z0000 Encounter for general adult medical examination without abnormal findings: Secondary | ICD-10-CM

## 2022-03-22 DIAGNOSIS — E782 Mixed hyperlipidemia: Secondary | ICD-10-CM

## 2022-03-22 DIAGNOSIS — R7401 Transaminitis: Secondary | ICD-10-CM

## 2022-03-22 DIAGNOSIS — I10 Essential (primary) hypertension: Secondary | ICD-10-CM

## 2022-03-22 DIAGNOSIS — K21 Gastroesophageal reflux disease with esophagitis without hemorrhage: Secondary | ICD-10-CM

## 2022-03-22 DIAGNOSIS — F5101 Primary insomnia: Secondary | ICD-10-CM

## 2022-03-22 DIAGNOSIS — R7301 Impaired fasting glucose: Secondary | ICD-10-CM

## 2022-03-22 LAB — COMPREHENSIVE METABOLIC PANEL
ALBUMIN: 4.4 g/dL (ref 3.5–5.0)
ALK PHOSPHATASE: 63 U/L (ref 25–110)
ANION GAP: 6 (ref 3–12)
BLD UREA NITROGEN: 38 mg/dL — ABNORMAL HIGH (ref 7–25)
CALCIUM: 9 mg/dL (ref 8.5–10.6)
CHLORIDE: 107 MMOL/L (ref 98–110)
CREATININE: 0.7 mg/dL (ref 0.4–1.24)
GLUCOSE,PANEL: 98 mg/dL — ABNORMAL HIGH (ref 70–100)
POTASSIUM: 4.4 MMOL/L (ref 3.5–5.1)
SODIUM: 140 MMOL/L (ref 137–147)
TOTAL BILIRUBIN: 0.3 mg/dL (ref 0.3–1.2)
TOTAL PROTEIN: 7 g/dL (ref 6.0–8.0)

## 2022-03-22 LAB — LIPID PROFILE
CHOLESTEROL: 106 mg/dL (ref <200)
HDL: 49 mg/dL (ref >40)
LDL: 43 mg/dL (ref <100)
TRIGLYCERIDES: 79 mg/dL (ref <150)

## 2022-03-22 LAB — CBC AND DIFF
HEMATOCRIT: 39 % — ABNORMAL LOW (ref 40–50)
MCH: 31 pg (ref 26–34)
MCHC: 33 g/dL (ref 32.0–36.0)
MCV: 93 FL (ref 80–100)
MPV: 6.6 FL — ABNORMAL LOW (ref 7–11)
RBC COUNT: 4.2 M/UL — ABNORMAL LOW (ref 4.4–5.5)
RDW: 12 % (ref 11–15)
WBC COUNT: 5.1 K/UL (ref 4.5–11.0)

## 2022-03-22 LAB — TSH WITH FREE T4 REFLEX: TSH: 2.3 uU/mL (ref 0.35–5.00)

## 2022-03-22 LAB — HEMOGLOBIN A1C: HEMOGLOBIN A1C: 5.4 % (ref 4.0–5.7)

## 2022-03-30 ENCOUNTER — Encounter: Admit: 2022-03-30 | Discharge: 2022-03-30 | Payer: BC Managed Care – PPO

## 2022-03-30 DIAGNOSIS — F419 Anxiety disorder, unspecified: Secondary | ICD-10-CM

## 2022-03-30 DIAGNOSIS — I10 Essential (primary) hypertension: Secondary | ICD-10-CM

## 2022-03-30 DIAGNOSIS — E782 Mixed hyperlipidemia: Secondary | ICD-10-CM

## 2022-03-30 DIAGNOSIS — K21 Gastroesophageal reflux disease with esophagitis without hemorrhage: Secondary | ICD-10-CM

## 2022-03-30 DIAGNOSIS — Z0000 Encounter for general adult medical examination without abnormal findings: Secondary | ICD-10-CM

## 2022-03-30 DIAGNOSIS — F5101 Primary insomnia: Secondary | ICD-10-CM

## 2022-03-30 MED ORDER — ROSUVASTATIN 20 MG PO TAB
ORAL_TABLET | 1 refills
Start: 2022-03-30 — End: ?

## 2022-03-30 MED ORDER — MELOXICAM 15 MG PO TAB
ORAL_TABLET | 3 refills
Start: 2022-03-30 — End: ?

## 2022-03-30 NOTE — Telephone Encounter
Some refill protocol elements NOT Met  Medication name: Mobic  Medication Strength: 14m tablet      Off protocol   This refill cannot be delegated      Routed to Provider     LTruitt Leep RN

## 2022-04-01 ENCOUNTER — Encounter: Admit: 2022-04-01 | Discharge: 2022-04-01 | Payer: BC Managed Care – PPO

## 2022-04-09 ENCOUNTER — Encounter: Admit: 2022-04-09 | Discharge: 2022-04-09 | Payer: BC Managed Care – PPO

## 2022-04-11 ENCOUNTER — Encounter: Admit: 2022-04-11 | Discharge: 2022-04-11 | Payer: BC Managed Care – PPO

## 2022-04-11 ENCOUNTER — Ambulatory Visit: Admit: 2022-04-11 | Discharge: 2022-04-11 | Payer: BC Managed Care – PPO

## 2022-04-11 DIAGNOSIS — R519 Generalized headaches: Secondary | ICD-10-CM

## 2022-04-11 DIAGNOSIS — M2560 Stiffness of unspecified joint, not elsewhere classified: Secondary | ICD-10-CM

## 2022-04-11 DIAGNOSIS — M2550 Pain in unspecified joint: Secondary | ICD-10-CM

## 2022-04-11 DIAGNOSIS — R9430 Abnormal result of cardiovascular function study, unspecified: Secondary | ICD-10-CM

## 2022-04-11 DIAGNOSIS — I10 Essential (primary) hypertension: Secondary | ICD-10-CM

## 2022-04-11 DIAGNOSIS — IMO0002 Ulcer: Secondary | ICD-10-CM

## 2022-04-11 DIAGNOSIS — M47816 Spondylosis without myelopathy or radiculopathy, lumbar region: Secondary | ICD-10-CM

## 2022-04-11 DIAGNOSIS — M5416 Radiculopathy, lumbar region: Secondary | ICD-10-CM

## 2022-04-11 DIAGNOSIS — F32A Depression: Secondary | ICD-10-CM

## 2022-04-11 DIAGNOSIS — G4730 Sleep apnea, unspecified: Secondary | ICD-10-CM

## 2022-04-11 DIAGNOSIS — E785 Hyperlipidemia, unspecified: Secondary | ICD-10-CM

## 2022-04-11 DIAGNOSIS — M1990 Unspecified osteoarthritis, unspecified site: Secondary | ICD-10-CM

## 2022-04-11 DIAGNOSIS — H269 Unspecified cataract: Secondary | ICD-10-CM

## 2022-04-11 NOTE — Progress Notes
[facility] CLINIC NOTE       SUBJECTIVE:   Jared Hebert is a 71 y.o.-year-old male with history of shoulder replacement, hyperlipidemia, and gastric ulcer, who presents for evaluation of worsening low back pain and lower extremity weakness.  The patient was last seen in clinic on 06/19/20 after he underwent a L5-S1 ESI with no relief. Patient also had undergone lumbar RFA with significant relief but only lasted for 2 weeks. His last MRI of lumbar spine was in March 2021. He reports increased pain, weakness, and numbness in the anterior thighs. He reports pain shooting down both legs with standing upright. He is limited in his ability to stand for prolonged periods or sit for prolonged periods. He is currently taking Gabapentin and has not noticed any improvement in his pain. He reports brain fog when taking during the day. He has been participating in a home exercise program daily for more than 8 weeks in the last 6 months. He is supposed to return in the next month and start building a house. He would like to proceed with updated imaging and possible injections. VAS pain score is rated as a 7/10.  Denies loss of bowel or bladder function.  Denies recent falls.        Review of Systems    Current Outpatient Medications:   ?  ascorbic acid (VITAMIN C) 500 mg tablet, Take one tablet by mouth daily., Disp: , Rfl:   ?  aspirin EC 81 mg tablet, Take one tablet by mouth daily. Take with food., Disp: , Rfl:   ?  cetirizine (ZYRTEC) 10 mg tablet, Take one tablet by mouth., Disp: , Rfl:   ?  citalopram (CELEXA) 40 mg tablet, Take one tablet by mouth daily., Disp: 90 tablet, Rfl: 3  ?  cyclobenzaprine (FLEXERIL) 5 mg tablet, Take one tablet to two tablets by mouth daily as needed., Disp: , Rfl:   ?  ezetimibe (ZETIA) 10 mg tablet, TAKE 1 TABLET BY MOUTH EVERY DAY, Disp: 90 tablet, Rfl: 1  ?  fluticasone propionate (FLONASE) 50 mcg/actuation nasal spray, suspension, Apply one spray to each nostril as directed daily. Shake bottle gently before using., Disp: , Rfl:   ?  folic acid (FOLVITE) 1 mg tablet, TAKE 1 TABLET BY MOUTH EVERY DAY, Disp: 90 tablet, Rfl: 2  ?  gabapentin (NEURONTIN) 300 mg capsule, TAKE 1 CAPSULE BY MOUTH EVERY 8 HOURS (Patient taking differently: Take one capsule by mouth at bedtime daily.), Disp: 270 capsule, Rfl: 3  ?  GLUCOSAMINE SULFATE PO, Take 1,500 mg by mouth once., Disp: , Rfl:   ?  losartan (COZAAR) 25 mg tablet, Take one tablet by mouth daily., Disp: 90 tablet, Rfl: 3  ?  meloxicam (MOBIC) 15 mg tablet, TAKE 1 TABLET BY MOUTH EVERY DAY, Disp: 90 tablet, Rfl: 3  ?  omeprazole DR (PRILOSEC) 40 mg capsule, Take one capsule by mouth daily., Disp: , Rfl:   ?  rosuvastatin (CRESTOR) 20 mg tablet, TAKE 1 TABLET BY MOUTH EVERY DAY, Disp: 90 tablet, Rfl: 1  ?  zolpidem (AMBIEN) 5 mg tablet, Take one tablet by mouth at bedtime as needed for Sleep., Disp: 30 tablet, Rfl: 5  Allergies   Allergen Reactions   ? Seasonal Allergies SNEEZING     Physical Exam  Vitals:    04/11/22 1401   BP: 123/69   BP Source: Arm, Right Upper   Pulse: 78   Temp: 36.6 ?C (97.9 ?F)   Resp: 18  SpO2: 98%   TempSrc: Oral   PainSc: Three  Comment: 5-7/10 when standing.   Weight: 82.1 kg (181 lb)   Height: 172.7 cm (5' 8)        Pain Score: Three (5-7/10 when standing.)  Body mass index is 27.52 kg/m?Marland Kitchen  General: 71 y.o. male appears stated age, in no acute distress  HEENT: Normocephalic, atraumatic  Neck: No thyroidmegaly  Cardiovascular: Well perfused  Pulmonary: Unlabored respirations  Extremities: No cyanosis, clubbing, or edema  Skin: Warm and dry  Psychiatric:  Appropriate mood and affect  Musculoskeletal: Stands with forward flexed posture. Full range of motion with lumbar flexion, decreased extension, and lateral rotation.  Tender to palpation at bilateral gluteal musculature. Facet loading is uncomfortable bilaterally.  FABER is negative bilaterally, on the left reproduces lateral hip pain.   FAIR is negative bilaterally. Neurologic: Weakness with bilateral hip flexion and single heel raises 4/5 otherwise, Lower extremity myotomes are all 5/5.  Lower extremity dermatomes are all intact to light touch.  Deep tendon reflexes are symmetric at patella and achilles.  Negative slump test bilaterally.   No ankle clonus.        IMPRESSION:  1. Lumbar radiculopathy    2. Lumbar spondylosis        PLAN:    1.  Lifestyle modifications.  Recommend activity as tolerated.  Avoid provocative maneuvers.  Keep spine in neutral position.  2.  Medications.  He may continue with Gabapentin. No other changes indicated at this time. Medication usage and safety reviewed.   3.  Therapy. Patient was provided with home exercises from the [employer], Sports Medicine Patient Advisor, 3rd Edition, [employer], 2010 in May of 2021.  The patient was instructed to perform on a daily basis for at least 6 weeks. He has been performing these daily since that time without relief.   4.  Imaging.  Ordered updated imaging of lumbar spine today.   5.  Interventions. May consider pending MRI review.   6.  Follow-up.  Patient to follow-up after MRI.

## 2022-04-17 ENCOUNTER — Encounter: Admit: 2022-04-17 | Discharge: 2022-04-17 | Payer: BC Managed Care – PPO

## 2022-04-17 NOTE — Telephone Encounter
Received phone call from [facility] requesting MRI Lumbar Spine order be faxed to [phone]. Orders faxed.

## 2022-04-24 ENCOUNTER — Encounter: Admit: 2022-04-24 | Discharge: 2022-04-24 | Payer: BC Managed Care – PPO

## 2022-04-24 ENCOUNTER — Ambulatory Visit: Admit: 2022-04-24 | Discharge: 2022-04-25 | Payer: BC Managed Care – PPO

## 2022-04-24 DIAGNOSIS — IMO0002 Ulcer: Secondary | ICD-10-CM

## 2022-04-24 DIAGNOSIS — G4730 Sleep apnea, unspecified: Secondary | ICD-10-CM

## 2022-04-24 DIAGNOSIS — M2560 Stiffness of unspecified joint, not elsewhere classified: Secondary | ICD-10-CM

## 2022-04-24 DIAGNOSIS — E785 Hyperlipidemia, unspecified: Secondary | ICD-10-CM

## 2022-04-24 DIAGNOSIS — M2550 Pain in unspecified joint: Secondary | ICD-10-CM

## 2022-04-24 DIAGNOSIS — R9430 Abnormal result of cardiovascular function study, unspecified: Secondary | ICD-10-CM

## 2022-04-24 DIAGNOSIS — M48062 Spinal stenosis, lumbar region with neurogenic claudication: Secondary | ICD-10-CM

## 2022-04-24 DIAGNOSIS — M1990 Unspecified osteoarthritis, unspecified site: Secondary | ICD-10-CM

## 2022-04-24 DIAGNOSIS — H269 Unspecified cataract: Secondary | ICD-10-CM

## 2022-04-24 DIAGNOSIS — I10 Essential (primary) hypertension: Secondary | ICD-10-CM

## 2022-04-24 DIAGNOSIS — F32A Depression: Secondary | ICD-10-CM

## 2022-04-24 DIAGNOSIS — B0229 Other postherpetic nervous system involvement: Secondary | ICD-10-CM

## 2022-04-24 DIAGNOSIS — R519 Generalized headaches: Secondary | ICD-10-CM

## 2022-04-24 NOTE — Progress Notes
SPINE CENTER CLINIC NOTE       SUBJECTIVE: Mr. Stavinoha is a 71 year old male with history of obstructive sleep apnea who presents for follow-up on radicular low back pain.  Patient has previously undergone radiofrequency ablation.  He had increasing pain in the legs and had transforaminal epidural steroid injection.  This did not provide complete relief.  He has had MRI completed which is detailed below.  He has secondary complaints of left arm pruritus after shingles flare in November.         Review of Systems    Current Outpatient Medications:   ?  ascorbic acid (VITAMIN C) 500 mg tablet, Take one tablet by mouth daily., Disp: , Rfl:   ?  aspirin EC 81 mg tablet, Take one tablet by mouth daily. Take with food., Disp: , Rfl:   ?  cetirizine (ZYRTEC) 10 mg tablet, Take one tablet by mouth., Disp: , Rfl:   ?  citalopram (CELEXA) 40 mg tablet, Take one tablet by mouth daily., Disp: 90 tablet, Rfl: 3  ?  cyclobenzaprine (FLEXERIL) 5 mg tablet, Take one tablet to two tablets by mouth daily as needed., Disp: , Rfl:   ?  ezetimibe (ZETIA) 10 mg tablet, TAKE 1 TABLET BY MOUTH EVERY DAY, Disp: 90 tablet, Rfl: 1  ?  fluticasone propionate (FLONASE) 50 mcg/actuation nasal spray, suspension, Apply one spray to each nostril as directed daily. Shake bottle gently before using., Disp: , Rfl:   ?  folic acid (FOLVITE) 1 mg tablet, TAKE 1 TABLET BY MOUTH EVERY DAY, Disp: 90 tablet, Rfl: 2  ?  gabapentin (NEURONTIN) 300 mg capsule, TAKE 1 CAPSULE BY MOUTH EVERY 8 HOURS (Patient taking differently: Take one capsule by mouth at bedtime daily.), Disp: 270 capsule, Rfl: 3  ?  GLUCOSAMINE SULFATE PO, Take 1,500 mg by mouth once., Disp: , Rfl:   ?  losartan (COZAAR) 25 mg tablet, Take one tablet by mouth daily., Disp: 90 tablet, Rfl: 3  ?  meloxicam (MOBIC) 15 mg tablet, TAKE 1 TABLET BY MOUTH EVERY DAY, Disp: 90 tablet, Rfl: 3  ?  omeprazole DR (PRILOSEC) 40 mg capsule, Take one capsule by mouth daily., Disp: , Rfl:   ?  rosuvastatin (CRESTOR) 20 mg tablet, TAKE 1 TABLET BY MOUTH EVERY DAY, Disp: 90 tablet, Rfl: 1  ?  zolpidem (AMBIEN) 5 mg tablet, Take one tablet by mouth at bedtime as needed for Sleep., Disp: 30 tablet, Rfl: 5  Allergies   Allergen Reactions   ? Seasonal Allergies SNEEZING     Physical Exam  Vitals:    04/24/22 0810   BP: 125/82   BP Source: Arm, Right Upper   Pulse: 62   SpO2: 97%   PainSc: Four   Weight: 82.1 kg (181 lb)   Height: 172.7 cm (5' 8)     Oswestry Total Score:: 30  Pain Score: Four  Body mass index is 27.52 kg/m?Marland Kitchen  General: 71 y.o. male appears stated age, in no acute distress  HEENT: Normocephalic, atraumatic  Neck: No thyroidmegaly  Cardiovascular: Well perfused  Pulmonary: Unlabored respirations  Extremities: No cyanosis, clubbing, or edema  Skin: No lesions seen on exposed skin  Psychiatric:  Appropriate mood and affect  Musculoskeletal: No atrophy.   Neurologic: Antigravity strength in all extremities. CN II -XII grossly intact.  Alert and oriented x 3.     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  There  is grade 1 anterolisthesis with unroofing of the disc resulting in severe bilateral neural foraminal stenosis.  At L4-L5 there is broad-based disc protrusion and ligamentum flavum hypertrophy resulting in severe trefoil and severe bilateral neural foraminal stenosis.  At L3-L4 there is grade 1 anterolisthesis with unroofing of the disc which is eccentric to the left combined with facet arthropathy resulting in moderate to severe left neural foraminal stenosis.         IMPRESSION:  1. Lumbar stenosis with neurogenic claudication    2. Post herpetic neuralgia          PLAN:     1.  Lifestyle modification.  Recommend activity as tolerated.  2.  Medication.  He may continue with gabapentin.  We discussed changing to Lyrica if he continues to have postherpetic neuralgia.  3.  Therapy.  He has not undergone physical therapy. He may continue with home program.  4.  Interventions.  I would recommend an L5-S1 interlaminar epidural steroid injection for therapeutic benefit.  5.  Referral.  Will refer patient orthopedic spine surgery for surgical evaluation.  6.  Follow-up.  Patient to follow-up for procedure.

## 2022-05-05 ENCOUNTER — Encounter: Admit: 2022-05-05 | Discharge: 2022-05-05 | Payer: BC Managed Care – PPO

## 2022-05-05 NOTE — Progress Notes
MRI lumbar spine from 04/18/2022 was personally reviewed demonstrated anterolisthesis of L3 on L4 with unroofing of the disc and facet arthropathy.  There is right posterior synovial cyst measuring 1.2 cm.  There is left lateral recess stenosis effacing the left L4 nerve root.  There is mild right and severe left neural foraminal stenosis.  At L4-L5 there is a right posterior facet synovial cyst measuring 1.4 cm.  There is severe central stenosis with mass effect on the right L5 nerve roots.  At L5-S1 there is anterolisthesis with unroofing of the disc and facet arthropathy resulting in severe right and moderate to severe left neural foraminal stenosis.

## 2022-05-21 ENCOUNTER — Encounter: Admit: 2022-05-21 | Discharge: 2022-05-21 | Payer: BC Managed Care – PPO

## 2022-05-21 NOTE — Progress Notes
Pt hospitalized since last office visit: No    Health Maintenance Due   Topic Date Due    SHINGLES RECOMBINANT VACCINE (1 of 2) Never done       There are no diagnoses linked to this encounter.    Labs not done:      Lab Frequency Next Occurrence   SLEEP STUDY Once 06/27/2021   CT CHEST WO CONTRAST Once 09/16/2021   REQUEST FOR CARDIOLOGY APPOINTMENT Once 09/16/2022   CT COR FFR IF INDICATED Once 11/04/2021   REQUEST FOR CARDIOLOGY APPOINTMENT Once 07/10/2022   REQUEST FOR CARDIOLOGY APPOINTMENT Once 04/13/2022   CBC AND DIFF Once 04/20/2022   BASIC METABOLIC PANEL Once 04/20/2022   HEMOGLOBIN A1C Once 04/20/2022   LIPID PROFILE Once 04/20/2022   LIVER FUNCTION PANEL Once 04/20/2022   MICROALB/CR RATIO-URINE RANDOM Once 04/20/2022   TSH WITH FREE T4 REFLEX Once 04/20/2022   MRI L-SPINE WO CONTRAST Once 04/11/2022   [place] [facility] SPINE INJECT INTERLAM LMBR/SAC Once 04/24/2022       MyChart message sent to patient on 05/31/23Angie Jean Rosenthal, RN     Notes to provider:

## 2022-05-27 ENCOUNTER — Ambulatory Visit: Admit: 2022-05-27 | Discharge: 2022-05-28 | Payer: BC Managed Care – PPO

## 2022-05-27 ENCOUNTER — Encounter: Admit: 2022-05-27 | Discharge: 2022-05-27 | Payer: BC Managed Care – PPO

## 2022-05-27 DIAGNOSIS — F5101 Primary insomnia: Secondary | ICD-10-CM

## 2022-05-27 DIAGNOSIS — M2550 Pain in unspecified joint: Secondary | ICD-10-CM

## 2022-05-27 DIAGNOSIS — M2560 Stiffness of unspecified joint, not elsewhere classified: Secondary | ICD-10-CM

## 2022-05-27 DIAGNOSIS — E782 Mixed hyperlipidemia: Secondary | ICD-10-CM

## 2022-05-27 DIAGNOSIS — Z125 Encounter for screening for malignant neoplasm of prostate: Secondary | ICD-10-CM

## 2022-05-27 DIAGNOSIS — Z0000 Encounter for general adult medical examination without abnormal findings: Secondary | ICD-10-CM

## 2022-05-27 DIAGNOSIS — I10 Essential (primary) hypertension: Secondary | ICD-10-CM

## 2022-05-27 DIAGNOSIS — R9430 Abnormal result of cardiovascular function study, unspecified: Secondary | ICD-10-CM

## 2022-05-27 DIAGNOSIS — E785 Hyperlipidemia, unspecified: Secondary | ICD-10-CM

## 2022-05-27 DIAGNOSIS — H269 Unspecified cataract: Secondary | ICD-10-CM

## 2022-05-27 DIAGNOSIS — R519 Generalized headaches: Secondary | ICD-10-CM

## 2022-05-27 DIAGNOSIS — M1990 Unspecified osteoarthritis, unspecified site: Secondary | ICD-10-CM

## 2022-05-27 DIAGNOSIS — F32A Depression: Secondary | ICD-10-CM

## 2022-05-27 DIAGNOSIS — R7301 Impaired fasting glucose: Secondary | ICD-10-CM

## 2022-05-27 DIAGNOSIS — G4730 Sleep apnea, unspecified: Secondary | ICD-10-CM

## 2022-05-27 DIAGNOSIS — F419 Anxiety disorder, unspecified: Secondary | ICD-10-CM

## 2022-05-27 DIAGNOSIS — IMO0002 Ulcer: Secondary | ICD-10-CM

## 2022-05-27 MED ORDER — ZOLPIDEM 5 MG PO TAB
5 mg | ORAL_TABLET | Freq: Every evening | ORAL | 5 refills | 30.00000 days | Status: AC | PRN
Start: 2022-05-27 — End: ?

## 2022-05-27 NOTE — Patient Instructions
Get fasting labs few days before return to clinic in 6 months

## 2022-05-27 NOTE — Progress Notes
Date of Service: 05/27/2022    Jared Hebert is a 71 y.o. male. DOB: 02/23/1951   MRN#: [id number]    Subjective: follow up       Hypertension  This is a chronic problem. The problem is controlled. Pertinent negatives include no anxiety, blurred vision, chest pain, headaches, malaise/fatigue, neck pain, orthopnea, palpitations or shortness of breath.   Back Pain  This is a chronic problem. The current episode started more than 1 month ago. The problem occurs intermittently. The pain is at a severity of 4/10. The pain is moderate. The pain is worse during the day. The symptoms are aggravated by bending and standing. Pertinent negatives include no abdominal pain, chest pain, dysuria, fever, headaches, numbness, weakness or weight loss. He has tried chiropractic manipulation and NSAIDs for the symptoms. The treatment provided no relief.   Patient Reported Other    Jared Hebert is 71 y.o. patient who presents to clinic for follow up. He established care 07/19/2019. He is a Engineer, civil (consulting) in [place]. No smoking, no alcohol, no street drugs. No regular exercise. He has 4 children.     He was last seen 01/20/22    He follows with spine center and saw them 04/11/22. He had a back injection 04/24/22      He had Rt should er surgery 11/2020, 04/2021 and again in 04/2021 at [facility].     He saw sleep clinic 05/2021    He saw Cardiology 09/16/21 and 11/2021:  He had inconclusive coronary CT study and stress test so he ended with cardiac cath a/23/23 which showed no critical stenosis.     He had left arm and left chest wall shingles in 10/2021.     He had upset stomach recently similar to when he GI bleed before. No blood in stool. He had a bleeding ulcer before so I advised him to talk to his outside GI doctor to get another EGD. His Hb dropped from 13.7 on 09/16/21 to 12.6 on 01/10/22   He had a normal repeat EGD 02/28/22 ( copied sent for scanning)    He was in clinic for low back pain for 6 weeks as above. 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   He was referred by cardiology to GI because of anemia. He did EGD and colonscopy 01/25/2020 and was found to have a non-bleeding gastric ulcer 15mm that was biopsied and is scheduled for repeat EGD in 2 months. Colonscopy was normal. Results of scopes are sent for scanning. I told him to stop all  NSAID and Mobic at this time. He had a repeat EGD 05/07/2020 by his outside GI doctor and was reported that gastric ulcer has healed.      His calcium score was 315. He saw cardiology 12/2019. He had CTA chest which was normal but There was incidental cholelithiasis and a right renal lesion that will require ultrasound for further definition.     He had a normal stress test 11/09/2020  He failed oral iron so cardiology ordered IV Iron.     He had labs drawn 03/22/22    He had abdomen US 01/30/2020 which showed:  Small hypoechoic exophytic right renal nodule most compatible with a   hemorrhagic cyst   Cholelithiasis   Mild bladder wall thickening, nonspecific but most likely secondary to   bladder outlet stenosis or neurogenic bladder     He managed to lose intentionally 55 Lb  over last 1 year by having ketogenic diet. He takes multivitamin daily.    He also struggles with depression but no intent to harm himself or others. His brother had depression and committed suicide in 2003.       Medical History:   Diagnosis Date   ? Abnormal cardiac function test 01/13/2022   ? Arthritis     back & shoulders   ? Cataract 5 Years ago   ? Depression 20+ Yrs   ? Generalized headaches Most of adult life   ? Hyperlipidemia    ? Hypertension    ? Joint pain 10 yrs ago   ? Joint stiffness 2009   ? Sleep apnea    ? Ulcer 12/2019       Surgical History:   Procedure Laterality Date   ? VASECTOMY  1984   ? HX CATARACT REMOVAL Bilateral 02/2017    [facility]   ? MENISCECTOMY Left 2019   ? PARS PLANA MECHANICAL VITRECTOMY WITH  [place] Right 09/02/2018    Performed by Blair Hailey, MD at [facility]   ? PARS PLANA MECHANICAL VITRECTOMY Left 11/04/2018    Performed by Blair Hailey, MD at [facility] OR   ? ANGIOGRAPHY CORONARY ARTERY WITH LEFT HEART CATHETERIZATION N/A 01/13/2022    Performed by Marcell Barlow, [facility] at [facility] CATH LAB   ? POSSIBLE PERCUTANEOUS CORONARY STENT PLACEMENT WITH ANGIOPLASTY N/A 01/13/2022    Performed by Marcell [facility]low[facility] at [facility] CATH LAB   ? HX ADENOIDECTOMY     ? HX ARTHROSCOPIC SURGERY  4 yrs ago    Left Knee Scope   ? HX TONSILLECTOMY     ? KNEE SURGERY  Left Knee Scope 4 yrs ago   ? NASAL SURGERY      TWICE       family history includes Arthritis in his father and mother; Back pain in his brother, father, and mother; Cataract in his father and mother; Depression in his brother; Heart Disease in his father; Heart problem in his father; Hypertension in his father; Joint Pain in his brother, father, and mother; Osteoporosis in his mother.    Social History     Socioeconomic History   ? Marital status: Married     Spouse name: Bonita Quin   Tobacco Use   ? Smoking status: Former     Packs/day: 1.00     Years: 10.00     Pack years: 10.00     Types: Cigarettes     Start date: 08/04/1967     Quit date: 2008     Years since quitting: 15.4   ? Smokeless tobacco: Former   ? Tobacco comments:     15 + yrs since last smoked   Vaping Use   ? Vaping Use: Never used   Substance and Sexual Activity   ? Alcohol use: Not Currently     Alcohol/week: 2.0 standard drinks of alcohol     Types: 2 Cans of beer per week     Comment: Social drinker   ? Drug use: Never   ? Sexual activity: Yes     Partners: Female     Birth control/protection: None       Social History     Tobacco Use   ? Smoking status: Former     Packs/day: 1.00     Years: 10.00     Pack years: 10.00     Types: Cigarettes     Start date:  08/04/1967     Quit date: 2008     Years since quitting: 15.4   ? Smokeless tobacco: Former   ? Tobacco comments:     15 + yrs since last smoked   Vaping Use   ? Vaping Use: Never used   Substance Use Topics   ? Alcohol use: Not Currently     Alcohol/week: 2.0 standard drinks of alcohol     Types: 2 Cans of beer per week     Comment: Social drinker        Review of Systems   Constitutional: Negative for chills, diaphoresis, fever, malaise/fatigue, night sweats, weight gain and weight loss.   HENT: Negative for congestion, sore throat and stridor.    Eyes: Negative for blurred vision.   Cardiovascular: Negative for chest pain, orthopnea, palpitations and syncope.   Respiratory: Negative for cough, shortness of breath, snoring and wheezing.    Skin: Negative for rash.   Musculoskeletal: Positive for back pain and myalgias. Negative for joint pain, joint swelling and neck pain.   Gastrointestinal: Negative for abdominal pain, anorexia, change in bowel habit, diarrhea, nausea and vomiting.   Genitourinary: Negative for dysuria, flank pain and hesitancy.   Neurological: Negative for disturbances in coordination, excessive daytime sleepiness, dizziness, focal weakness, headaches, numbness, seizures, vertigo and weakness.   Psychiatric/Behavioral: Negative for depression, hallucinations and memory loss. The patient is not nervous/anxious.    Allergic/Immunologic: Negative for hives.   All other systems reviewed and are negative.      Objective:     ? ascorbic acid (VITAMIN C) 500 mg tablet Take one tablet by mouth daily.   ? cetirizine (ZYRTEC) 10 mg tablet Take one tablet by mouth.   ? citalopram (CELEXA) 40 mg tablet Take one tablet by mouth daily.   ? cyclobenzaprine (FLEXERIL) 5 mg tablet Take one tablet to two tablets by mouth daily as needed.   ? ezetimibe (ZETIA) 10 mg tablet TAKE 1 TABLET BY MOUTH EVERY DAY   ? fluticasone propionate (FLONASE) 50 mcg/actuation nasal spray, suspension Apply one spray to each nostril as directed daily. Shake bottle gently before using.   ? folic acid (FOLVITE) 1 mg tablet TAKE 1 TABLET BY MOUTH EVERY DAY   ? gabapentin (NEURONTIN) 300 mg capsule TAKE 1 CAPSULE BY MOUTH EVERY 8 HOURS (Patient taking differently: Take one capsule by mouth at bedtime daily.)   ? GLUCOSAMINE SULFATE PO Take 1,500 mg by mouth once.   ? losartan (COZAAR) 25 mg tablet Take one tablet by mouth daily.   ? meloxicam (MOBIC) 15 mg tablet TAKE 1 TABLET BY MOUTH EVERY DAY   ? omeprazole DR (PRILOSEC) 40 mg capsule Take one capsule by mouth daily.   ? rosuvastatin (CRESTOR) 20 mg tablet TAKE 1 TABLET BY MOUTH EVERY DAY   ? zolpidem (AMBIEN) 5 mg tablet Take one tablet by mouth at bedtime as needed for Sleep.     Vitals:    05/27/22 1109   BP: 121/79   BP Source: Arm, Left Upper   Pulse: 70   Temp: 36.6 ?C (97.9 ?F)   Resp: 16   SpO2: 96%   TempSrc: Oral   PainSc: Three   Weight: 82.4 kg (181 lb 9.6 oz)   Height: 172.7 cm (5' 7.99)     Body mass index is 27.62 kg/m?Marland Kitchen     Physical Exam  Vitals and nursing note reviewed.   Constitutional:       General: He is not in  acute distress.     Appearance: He is well-developed. He is not diaphoretic.   HENT:      Head: Normocephalic and atraumatic.      Nose: Nose normal.   Eyes:      General: No scleral icterus.        Right eye: No discharge.         Left eye: No discharge.      Conjunctiva/sclera: Conjunctivae normal.      Pupils: Pupils are equal, round, and reactive to light.   Neck:      Thyroid: No thyromegaly.      Vascular: No JVD.      Trachea: No tracheal deviation.   Cardiovascular:      Rate and Rhythm: Normal rate and regular rhythm.      Heart sounds: Normal heart sounds. No murmur heard.     No friction rub.   Pulmonary:      Effort: Pulmonary effort is normal. No respiratory distress.      Breath sounds: Normal breath sounds. No wheezing or rales.   Chest:      Chest wall: No tenderness.   Abdominal:      General: There is no distension.      Palpations: Abdomen is soft. There is no mass.      Tenderness: There is no abdominal tenderness. There is no guarding or rebound.   Musculoskeletal:         General: Normal range of motion.      Cervical back: Normal range of motion and neck supple.   Lymphadenopathy:      Cervical: No cervical adenopathy.   Skin:     General: Skin is warm and dry.   Neurological:      Mental Status: He is alert and oriented to person, place, and time.      Cranial Nerves: No cranial nerve deficit.   Psychiatric:         Mood and Affect: Mood normal.         Behavior: Behavior normal.         Thought Content: Thought content normal.         Judgment: Judgment normal.          Assessment and Plan:         Chronic low back pain for few weeks:  He went to chiroparcter and took NSAID and did home exercises  With his gastric ulcer, NO NSAID or MOBIC  He is seeing PT at [place]  We referred to PM&R again  He had ablation  He saw spine clinic 05/2020 and 03/2022 and 04/2022  He got MRI  Doing well  No complaints    Rt shoulder injury at work:  He had surgery 11/2020, and 04/2021  Stable    Gastric ulcer:  He is in clinic for low back pain for 6 weeks as above. 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   He was referred by cardiology to GI because of anemia. He did EGD and colonscopy 01/25/2020 and was found to have a non-bleeding gastric ulcer 15mm that was biopsied and is scheduled for repeat EGD in 2 months. Colonscopy was normal. Results of scopes are sent for scanning. I told him to stop all  NSAID and Mobic at this time  He failed oral iron so cardiology ordered IV Iron.   He is scheduled to repeat EGD 05/07/20. I told him  to restart Mobic with food if EGD is back to normal.   He had a repeat EGD 05/07/2020 by his outside GI doctor and was reported that gastric ulcer has healed.    Stable. Avoid NSAID  He had upset stomach recently similar to when he GI bleed before. No blood in stool. He had a bleeding ulcer before so I advised him to talk to his outside GI doctor to get another EGD. His Hb dropped from 13.7 on 09/16/21 to 12.6 on 01/10/22   He had a normal repeat EGD 02/28/22 ( copied sent for scanning)  CBC w diff    Lab Results   Component Value Date/Time    WBC 5.1 03/22/2022 08:02 AM    RBC 4.23 (L) 03/22/2022 08:02 AM    HGB 13.5 03/22/2022 08:02 AM    HCT 39.7 (L) 03/22/2022 08:02 AM    MCV 93.8 03/22/2022 08:02 AM    MCH 31.8 03/22/2022 08:02 AM    MCHC 33.9 03/22/2022 08:02 AM    RDW 12.7 03/22/2022 08:02 AM    PLTCT 210 03/22/2022 08:02 AM    MPV 6.6 (L) 03/22/2022 08:02 AM    Lab Results   Component Value Date/Time    NEUT 56 03/22/2022 08:02 AM    ANC 2.89 03/22/2022 08:02 AM    LYMA 25 03/22/2022 08:02 AM    ALC 1.28 03/22/2022 08:02 AM    MONA 11 03/22/2022 08:02 AM    AMC 0.54 03/22/2022 08:02 AM    EOSA 7 (H) 03/22/2022 08:02 AM    AEC 0.38 03/22/2022 08:02 AM    BASA 1 03/22/2022 08:02 AM    ABC 0.03 03/22/2022 08:02 AM        Shingles:  He had left arm and left chest wall shingles in 10/2021.     Hypertension:  He is on HCTZ and Losarten  BP Readings from Last 3 Encounters:   05/27/22 121/79   04/24/22 125/82   04/11/22 123/69   continue  He had stress test and echo in 02/2016 that were reported as normal and I reviewed in care Every where  We ordered CT calcium score which he did 08/2019  Stable    Coronary calcification:  CT calcium scan 08/2019:  Quantitative Coronary Calcium Score:   Left main: 0   LAD: 114   LCX: 144   RCA: 5 7   Total calcium score: 315   He saw cardiology and had echo and normal stress test 10/2019 and has a follow up 12/2019 and 09/16/21  He saw Cardiology 09/16/21 and 11/2021:  He had inconclusive coronary CT study and stress test so he ended with cardiac cath a/23/23 which showed no critical stenosis.   Doing well    Renal mass:  His calcium score was 315. He saw cardiology 12/2019. He had CTA chest which was normal but There was incidental cholelithiasis and a right renal lesion that will require ultrasound for further definition.     He had a normal stress test 11/09/2020  He had abdomen US 01/30/2020 which showed:  Small hypoechoic exophytic right renal nodule most compatible with a   hemorrhagic cyst Cholelithiasis   Mild bladder wall thickening, nonspecific but most likely secondary to   bladder outlet stenosis or neurogenic bladder   Stable    Hyperlipidemia:  He is on Zocor 20mg  daily but did not take for 30 days. He had cramps with Lipitor.  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  He is on Effexor and Remeron  He is not feeling that this combination is helping  He is homicidal or suicidal but his brother committed suicide in 2013  We got him to taper off his medications over2 weeks and we he started Celexa 20mg  daily after he tapered off  We referred to psychologist  I am referring this patient to the behavior health consultant for the following conditions:  Health Risk Behaviors:  Stress management  Mental Health Conditions:  Depression and Anxiety  We increased Celexa from 20 to 40mg  daily back in 08/2019  He is doing better    History of sleep apnea:  He is not snoring after he lost weight  He struggles with staying sleep and Ambien did not help  He saw sleep clinic 05/2021  Refill    Weakness:  Normal labs and TSH and B12 level   He is on ketogenic diet    Hearing deficits:  He has hearing aids    Routine health maintenance:    Colonoscopy: 2014, normal  Tdap: 2015  Pneumovax: Prevnar 07/19/2019 and plan on pneumovax in a year  Influenza:09/2018, 10/2019, 11/2021  Eye exam: 2019  Dental exam: 2019  AAA screening: 07/2019  He had COVID vaccine 10/2020, and today 01/20/22    Return to clinic in 4 months with labs    Total of 43 minutes were spent on the same day of the visit including preparing to see the patient, obtaining and/or reviewing separately obtained history, performing a medically appropriate examination and/or evaluation, counseling and educating the patient/family/caregiver, ordering medications, tests, or procedures, referring and communication with other health care professionals, documenting clinical information in the electronic or other health record, independently interpreting results and communicating results to the patient/family/caregiver, and care coordination.   I Counseled patient regarding low back pain, depression and weakness. We discussed safety during COVID19 pandemic.     Orders Placed This Encounter   ? CBC AND DIFF   ? BASIC METABOLIC PANEL   ? HEMOGLOBIN A1C   ? LIPID PROFILE   ? LIVER FUNCTION PANEL   ? MICROALB/CR RATIO-URINE RANDOM   ? TSH WITH FREE T4 REFLEX   ? PSA SCREEN today   ? zolpidem (AMBIEN) 5 mg tablet     Encounter Medications   Medications   ? zolpidem (AMBIEN) 5 mg tablet     Sig: Take one tablet by mouth at bedtime as needed for Sleep.     Dispense:  30 tablet     Refill:  5     Future Appointments   Date Time Provider Department Center   06/02/2022  3:30 PM Joanie Coddington, MD [facility] None   06/16/2022  9:00 AM Joanie Coddington, MD [facility] SPINE   06/27/2022  9:00 AM [facility]     Patient Instructions   Get fasting labs few days before return to clinic in 6 months

## 2022-05-28 DIAGNOSIS — G8929 Other chronic pain: Secondary | ICD-10-CM

## 2022-05-28 DIAGNOSIS — K2100 Gastro-esophageal reflux disease with esophagitis, without bleeding: Secondary | ICD-10-CM

## 2022-05-28 DIAGNOSIS — M545 Low back pain, unspecified: Secondary | ICD-10-CM

## 2022-05-28 DIAGNOSIS — F32A Depression, unspecified: Secondary | ICD-10-CM

## 2022-06-02 ENCOUNTER — Encounter: Admit: 2022-06-02 | Discharge: 2022-06-02 | Payer: BC Managed Care – PPO

## 2022-06-03 ENCOUNTER — Encounter: Admit: 2022-06-03 | Discharge: 2022-06-03 | Payer: BC Managed Care – PPO

## 2022-06-03 DIAGNOSIS — M5416 Radiculopathy, lumbar region: Secondary | ICD-10-CM

## 2022-06-10 ENCOUNTER — Encounter: Admit: 2022-06-10 | Discharge: 2022-06-10 | Payer: BC Managed Care – PPO

## 2022-06-10 DIAGNOSIS — M549 Dorsalgia, unspecified: Secondary | ICD-10-CM

## 2022-06-10 NOTE — Patient Instructions
It was a pleasure seeing you in clinic today.    Maitri Schnoebelen RN, CNC  Dr. Joshua T. Bunch/Ortho Spine Surgery  [facility]  [facility]  [address]  [place], [place] [address]  Phone: [phone]   Fax [phone]  Scheduling [phone]  [email or web address]    We appreciate the interest in your health.  The physician reviews both the radiology reports and the imaging independently and will contact you if there are any emergent findings.  If you would like, incidental and chronic changes seen in your images can be reviewed with Dr. Bunch at a follow up appointment.  At that appointment, the radiology report and terminology used in that report can also be explained. Please call our Schedule line if you would like to follow up.

## 2022-06-16 ENCOUNTER — Encounter: Admit: 2022-06-16 | Discharge: 2022-06-16 | Payer: BC Managed Care – PPO

## 2022-06-18 ENCOUNTER — Encounter: Admit: 2022-06-18 | Discharge: 2022-06-18 | Payer: BC Managed Care – PPO

## 2022-06-18 NOTE — Telephone Encounter
Spoke to patient about upcoming appointment. Told him he need to complete his new patient questionnaires before appointment or if he is unable to do to it to arrive 30 mins early. Remind him that his appointment is on July 7th and questionnaires will be available this Friday.

## 2022-06-18 NOTE — Telephone Encounter
I have reviewed the notes, assessments, and/or procedures performed by Angelina Camejo, and concur with her/his documentation unless otherwise noted.

## 2022-06-20 ENCOUNTER — Encounter: Admit: 2022-06-20 | Discharge: 2022-06-20 | Payer: BC Managed Care – PPO

## 2022-06-27 ENCOUNTER — Ambulatory Visit: Admit: 2022-06-27 | Discharge: 2022-06-27 | Payer: BC Managed Care – PPO

## 2022-06-27 ENCOUNTER — Encounter: Admit: 2022-06-27 | Discharge: 2022-06-27 | Payer: BC Managed Care – PPO

## 2022-06-27 DIAGNOSIS — R519 Generalized headaches: Secondary | ICD-10-CM

## 2022-06-27 DIAGNOSIS — M4316 Spondylolisthesis, lumbar region: Secondary | ICD-10-CM

## 2022-06-27 DIAGNOSIS — H269 Unspecified cataract: Secondary | ICD-10-CM

## 2022-06-27 DIAGNOSIS — M5136 Other intervertebral disc degeneration, lumbar region: Secondary | ICD-10-CM

## 2022-06-27 DIAGNOSIS — M4800 Spinal stenosis, site unspecified: Secondary | ICD-10-CM

## 2022-06-27 DIAGNOSIS — F32A Depression: Secondary | ICD-10-CM

## 2022-06-27 DIAGNOSIS — R9430 Abnormal result of cardiovascular function study, unspecified: Secondary | ICD-10-CM

## 2022-06-27 DIAGNOSIS — E785 Hyperlipidemia, unspecified: Secondary | ICD-10-CM

## 2022-06-27 DIAGNOSIS — M2560 Stiffness of unspecified joint, not elsewhere classified: Secondary | ICD-10-CM

## 2022-06-27 DIAGNOSIS — G4730 Sleep apnea, unspecified: Secondary | ICD-10-CM

## 2022-06-27 DIAGNOSIS — M48062 Spinal stenosis, lumbar region with neurogenic claudication: Secondary | ICD-10-CM

## 2022-06-27 DIAGNOSIS — M1990 Unspecified osteoarthritis, unspecified site: Secondary | ICD-10-CM

## 2022-06-27 DIAGNOSIS — M2550 Pain in unspecified joint: Secondary | ICD-10-CM

## 2022-06-27 DIAGNOSIS — M549 Dorsalgia, unspecified: Secondary | ICD-10-CM

## 2022-06-27 DIAGNOSIS — IMO0002 Ulcer: Secondary | ICD-10-CM

## 2022-06-27 DIAGNOSIS — I10 Essential (primary) hypertension: Secondary | ICD-10-CM

## 2022-06-27 DIAGNOSIS — M48061 Spinal stenosis, lumbar region without neurogenic claudication: Secondary | ICD-10-CM

## 2022-06-27 NOTE — Progress Notes
[facility]INE [facility] HISTORY AND PHYSICAL      CHIEF COMPLAINT     Chief Complaint   Patient presents with   ? New Patient   ? Back Pain     Lower back pain goes down bilateral on his legs   ? Weakness     Weakness bilateral in his legs          HISTORY OF PRESENT ILLNESS   Jared Hebert is a 71 y.o. male.  He is seen today from Dr. Alanson Aly Smith's office. he reports he has had back pain for quite a while and over the last year has gotten progressively worse.  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  First thing in the morning pain is mostly in the right buttock.  After the a while it is left anterior thigh and then tingling down both legs.  He started noticing the foot drop approximately six months ago.  The left side is worse than the right.  He has had near falls.  He has been always to latch onto something to avoid falling.  No bowel or bladder changes.  He is interested in a surgical solution.  He has tried injections with Dr. Katrinka Blazing with less than 30 days relief.  He also had RFA would relief.  He has a history of cardiac catheterization the first of the year.  Otherwise no major medical history.  No history of DVT or PE.  No previous spine surgery.  No history of abdominal surgeries.      Previous Spine Operations -  No    NSAIDS:  Yes  PT:  No  Pain medications:  Yes  Activity modification: Yes  Injections:  None recent       Spine Comprehensive History Form  HPI     When did your pain start?    Greater than 1 year Where is your worst pain?    Leg(s)    Have you been given a diagnosis for your pain?      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    No Date of Injury:           Was there a cause of injury (e.g.. fall, MVA, heavy lifting)?      No Please explain injury:           Is the pain constant or does it come and go?     Constant Words that best describe the quality of your pain:      Aching, Stabbing, Tiring, Shooting, Burning, Sharp    What makes the pain better? (e.g. rest, sitting down, ice, heat, medications):      Sitting down Other reason that makes the pain better?           What makes the pain worse? (e.g. sit, stand, walk, bend):      Stand, Walk Other reason that makes the pain worse?            How long can you  sit before the pain occurs? (in minutes)      How long can you stand before the pain occurs? (in minutes)      How long can you walk before the pain occurs? (in minutes)        Does the pain move into your arm or leg?    No Explanation of how far pain moves:         Do you have numbness in your arm or leg?      None Explanation of numbness in arm from start point to end point:      Explanation of numbness in leg from start point to end point:        Do you have weakness in your arm or leg?      Leg Explanation of weakness in arm from start point to end point:      Explanation of weakness in leg from start point to end point:    Hip, Ankle   Have you lost control of bladder or bowel function?    No Explanation of lost control of bladder or bowel function:         Is the pain worse at night?    No Description of how pain effects sleep:         Have you had any unplanned weight loss?    No       PAIN SCALE    You are here for pain. Where is the pain?    Low back, Leg or foot    On a scale of 0 to 10, how bad is your low back pain?    7 On a scale of 0 to 10, what was your average low back pain level in the past week?    7   On a scale of 0 to 10, how bad is your leg or foot pain?    7 On a scale of 0 to 10, what was your average leg or foot pain level in the past week?    5 On a scale of 0 to 10, how bad is your middle back pain?        On a scale of 0 to 10, what was your average middle back pain level in the past week?        On a scale of 0 to 10, how bad is your neck or upper back pain?      On a scale of 0 to 10, what was your average neck or upper back pain level in the past week?        On a scale of 0 to 10, how bad is your arm or hand pain?      On a scale of 0 to 10, what was your average arm or hand pain level in the past week?          SCOLIOSIS     Being seen for scoliosis?      When was your scoliosis first detected?      Explain how your scoliosis was detected:        What was your age of onset of menstruation (period)?      Please indicate any family members with scoliosis:      How much have you grown in the last year? (in inches)          TESTS FOR CONDITION DATE FACILITY (HOSPITAL/CLINIC)   X-Ray?  CT Scan?                    MRI?          04/18/22     [facility]   Discogram?                    Myelogram?                    EMG/NCV?                    Other Tests?                      TREATMENT FOR CONDITION DATE  % OF RELIEF   Physical Therapy?          03/22/22 How long did you do physical therapy? (in weeks)    12         Chiropractic Care?            How many times did you see the chiropractor?              Injection?            04/11/20   What type of spine injection have you had in the past?    Burning of nerve, Trigger point         Surgery?    No       What type of spine surgery have you had in the past?              Psychology?                  Other Treatments?         Comments:                  MEDICATIONS TRIED NAME DATE STARTING TAKING RELIEF   Anti-inflammatory (NSAID)?                   Anti-Spasmotic?                     Neuropathic?                     Relaxant?               Anti-depressant?                   Steroid?               Opioid?                             BLOOD THINNER   meloxicam (Mobic) :           :                                       PAST MEDICAL HISTORY     Medical History:   Diagnosis Date   ? Abnormal cardiac function test 01/13/2022   ? Arthritis     back & shoulders   ? Cataract 5 Years ago   ? Degenerative disc disease, lumbar    ? Depression 20+ Yrs   ? Generalized headaches Most of adult life   ? Hyperlipidemia    ? Hypertension    ?  Joint pain 10 yrs ago   ? Joint stiffness 2009   ? Sleep apnea    ? Spinal stenosis    ? Ulcer 12/2019       PAST SURGICAL HISTORY     Surgical History:   Procedure Laterality Date   ? VASECTOMY  1984   ? HX CATARACT REMOVAL Bilateral 02/2017    [facility]   ? MENISCECTOMY Left 2019   ? PARS PLANA MECHANICAL VITRECTOMY WITH  [place] Right 09/02/2018    Performed by Blair Hailey, MD at [facility] OR   ? PARS PLANA MECHANICAL VITRECTOMY Left 11/04/2018    Performed by Blair Hailey, MD at [facility] OR   ? ANGIOGRAPHY CORONARY ARTERY WITH LEFT HEART CATHETERIZATION N/A 01/13/2022    Per[facility] by Marcell [facility]low[facility] at [facility] CATH LAB   ? POSSIBLE PERCUTANEOUS CORONARY STENT PLACEMENT WITH ANGIOPLASTY N/A 01/13/2022    Performed by Marcell Barlow, MD at [facility] CATH LAB   ? HX ADENOIDECTOMY     ? HX ARTHROSCOPIC SURGERY  4 yrs ago    Left Knee Scope   ? HX JOINT REPLACEMENT  04/2021    Right Shoulder - Injury   ? HX TONSILLECTOMY     ? KNEE SURGERY  Left Knee Scope 4 yrs ago   ? NASAL SURGERY      TWICE   ? UMBILICAL ARTERIAL CATH - BEDSIDE  Less than a year    No Stents Placed       FAMILY HISTORY   family history includes Arthritis in his father and mother; Back pain in his brother, father, and mother; Cataract in his father and mother; Depression in his brother; Heart Disease in his father; Heart problem in his father; Hypertension in his father; Joint Pain in his brother, father, and mother; Osteoporosis in his mother.    SOCIAL HISTORY     Social History     Socioeconomic History   ? Marital status: Married     Spouse name: Bonita Quin Tobacco Use   ? Smoking status: Former     Packs/day: 1.00     Years: 10.00     Pack years: 10.00     Types: Cigarettes, Pipe, Cigars     Start date: 08/04/1967     Quit date: 12/22/2006     Years since quitting: 15.5   ? Smokeless tobacco: Former   ? Tobacco comments:     15 + yrs since last smoked   Vaping Use   ? Vaping Use: Never used   Substance and Sexual Activity   ? Alcohol use: Not Currently     Alcohol/week: 2.0 standard drinks of alcohol     Types: 2 Cans of beer per week     Comment: Less than 10 Beers a year.   ? Drug use: Never   ? Sexual activity: Yes     Partners: Female     Birth control/protection: None       ALLERGIES     Allergies   Allergen Reactions   ? Seasonal Allergies SNEEZING       MEDICATIONS     Current Outpatient Medications:   ?  ascorbic acid (VITAMIN C) 500 mg tablet, Take one tablet by mouth daily., Disp: , Rfl:   ?  cetirizine (ZYRTEC) 10 mg tablet, Take one tablet by mouth., Disp: , Rfl:   ?  citalopram (CELEXA) 40 mg tablet, Take one tablet by mouth daily., Disp: 90 tablet, Rfl: 3  ?  cyclobenzaprine (FLEXERIL) 5 mg tablet, Take one tablet to two tablets by mouth daily as needed., Disp: , Rfl:   ?  ezetimibe (ZETIA) 10 mg tablet, TAKE 1 TABLET BY MOUTH EVERY DAY, Disp: 90 tablet, Rfl: 1  ?  fluticasone propionate (FLONASE) 50 mcg/actuation nasal spray, suspension, Apply one spray to each nostril as directed daily. Shake bottle gently before using., Disp: , Rfl:   ?  folic acid (FOLVITE) 1 mg tablet, TAKE 1 TABLET BY MOUTH EVERY DAY, Disp: 90 tablet, Rfl: 2  ?  gabapentin (NEURONTIN) 300 mg capsule, TAKE 1 CAPSULE BY MOUTH EVERY 8 HOURS (Patient taking differently: Take one capsule by mouth at bedtime daily.), Disp: 270 capsule, Rfl: 3  ?  GLUCOSAMINE SULFATE PO, Take 1,500 mg by mouth once., Disp: , Rfl:   ?  losartan (COZAAR) 25 mg tablet, Take one tablet by mouth daily., Disp: 90 tablet, Rfl: 3  ?  meloxicam (MOBIC) 15 mg tablet, TAKE 1 TABLET BY MOUTH EVERY DAY, Disp: 90 tablet, Rfl: 3  ?  omeprazole DR (PRILOSEC) 40 mg capsule, Take one capsule by mouth daily., Disp: , Rfl:   ?  rosuvastatin (CRESTOR) 20 mg tablet, TAKE 1 TABLET BY MOUTH EVERY DAY, Disp: 90 tablet, Rfl: 1  ?  zolpidem (AMBIEN) 5 mg tablet, Take one tablet by mouth at bedtime as needed for Sleep., Disp: 30 tablet, Rfl: 5    REVIEW OF SYSTEMS   Review of Systems   Musculoskeletal: Positive for back pain.   Neurological: Positive for weakness and numbness.   All other systems reviewed and are negative.      PHYSICAL EXAM     Vitals:    06/27/22 0853   BP: 135/70   BP Source: Arm, Left Upper   Pulse: 65   SpO2: 98%   PainSc: Five   Weight: 84.4 kg (186 lb)   Height: 172.7 cm (5' 8)     Oswestry Total Score:: 40  Pain Score: Five  Body mass index is 28.28 kg/m?Marland Kitchen    Constitutional: Alert, NAD  Psychiatric: Mood and affect appropriate  Eyes: EOMI  Respiratory: Unlabored respirations  Cardiovascular: Palpable radial and pedal pulses distally.  Skin: No rashes or lesions  Musculoskeletal:  Spine Exam:    Gait: Ambulates with a normal gait. Able to toe walk without difficulty.  Unable to heel walk.   Stance: Balanced in the coronal and sagittal planes.    BACK:   No scars, rash or lesions.    PALPATION:  No pain in the midline or paraspinals. No pain at the SI joint or sciatic notch.    MOTOR:  Lower Ext. Iliopsoas Quads Hamstrings Gastroc Tib ant EHL   Right 5 5 5 5 5 5    Left 5 5 5 5 5 5      SENSATION:  Lower extremity: Sensation intact to light touch in L3-S1 distributions    REFLEXES:   Patellar Achilles   Right 2+ 2+   Left 2+ 2+     -Negative straight leg raise bilaterally  -No clonus      RADIOGRAPHIC EVALUATION       1. ?Mild left thoracic and right lumbar curvature.     2. ?Mild loss of height of C4 and C5.     3. ?Marked disc disease and/or disc erosions C4-5, and severe disc disease   at C5-6 and C6-7. Para moderate multilevel disc disease in the thoracic   spine.     4. Severe degenerative  disc disease in the lumbar spine L3-S1. Partially   sacralized S1 with small vestigial S1-2 disc. Moderate grade 1   anterolisthesis at L5-S1.     5. Right reverse total shoulder arthroplasty partially visualized. Mild   degenerative change of the hips.       ?Finalized by Shanna Cisco, M.D. on 06/27/2022 11:57 AM. Dictated by Shanna Cisco, M.D. on 06/27/2022 11:52 AM.    MRI Lumbar Spine            ASSESSMENT / PLAN     Kowen Chihuahua is a 71 y.o. male with L5-S1 spondylolisthesis with bilateral lumbar radiculopathy and lower extremity weakness.    Natural history and conservative treatment options were discussed with the patient.  At this point he is interested in pursuing a more definitive treatment option.  I certainly think that is reasonable.  He would be looking at a multi-level fusion, likely anterior posterior versus anterior lateral posterior L3-S1 decompression and fusion-type procedure.  Prior to making a final surgical plan, we need to get a CT scan of his lumbar spine.  We need to get a CT scan with correct protocol and slice thickness of .625 mm and 1 mm slice thicknesses to utilize intra-operative CT fluoro merge for navigation.  We will have him get this study and follow up with Dr. Liam Rogers to review and for further surgical discussion.  I have told the patient if he would like to proceed with his already planned injection with Dr. Katrinka Blazing while being worked up for surgery, that would be fine.  Instructed the patient to call the office with new or worsening symptoms, bowel or bladder changes.          In the presence of Dickie La, PA-C , I have taken down these notes, Mamie Laurel, Scribe. June 27, 2022 9:25 AM      Coralie Common, PA-C   Physician Assistant to Laneta Simmers, MD  Spinal Surgery  [place] [place]. Clydene Pugh MD, [facility]  Nurse: Jen Mow, RN   [phone]  -  [email or web address]

## 2022-07-02 ENCOUNTER — Encounter: Admit: 2022-07-02 | Discharge: 2022-07-02 | Payer: BC Managed Care – PPO

## 2022-07-02 MED ORDER — EZETIMIBE 10 MG PO TAB
ORAL_TABLET | 1 refills | Status: AC
Start: 2022-07-02 — End: ?

## 2022-07-07 ENCOUNTER — Encounter: Admit: 2022-07-07 | Discharge: 2022-07-07 | Payer: BC Managed Care – PPO

## 2022-07-07 DIAGNOSIS — M48062 Spinal stenosis, lumbar region with neurogenic claudication: Secondary | ICD-10-CM

## 2022-07-07 DIAGNOSIS — M48061 Spinal stenosis, lumbar region without neurogenic claudication: Secondary | ICD-10-CM

## 2022-07-07 DIAGNOSIS — M5136 Other intervertebral disc degeneration, lumbar region: Secondary | ICD-10-CM

## 2022-07-07 DIAGNOSIS — M4316 Spondylolisthesis, lumbar region: Secondary | ICD-10-CM

## 2022-07-21 ENCOUNTER — Encounter: Admit: 2022-07-21 | Discharge: 2022-07-21 | Payer: BC Managed Care – PPO

## 2022-07-21 ENCOUNTER — Ambulatory Visit: Admit: 2022-07-21 | Discharge: 2022-07-21 | Payer: BC Managed Care – PPO

## 2022-07-21 DIAGNOSIS — M48062 Spinal stenosis, lumbar region with neurogenic claudication: Secondary | ICD-10-CM

## 2022-07-21 MED ORDER — TRIAMCINOLONE ACETONIDE 40 MG/ML IJ SUSP
80 mg | Freq: Once | EPIDURAL | 0 refills | Status: CP
Start: 2022-07-21 — End: ?

## 2022-07-21 MED ORDER — SODIUM CHLORIDE 0.9 % IJ SOLN
3 mL | Freq: Once | INTRAMUSCULAR | 0 refills | Status: CP
Start: 2022-07-21 — End: ?

## 2022-07-21 MED ORDER — IOHEXOL 240 MG IODINE/ML IV SOLN
1 mL | Freq: Once | EPIDURAL | 0 refills | Status: CP
Start: 2022-07-21 — End: ?

## 2022-07-21 MED ORDER — LIDOCAINE (PF) 10 MG/ML (1 %) IJ SOLN
2 mL | Freq: Once | INTRAMUSCULAR | 0 refills | Status: CP
Start: 2022-07-21 — End: ?

## 2022-07-21 NOTE — Procedures
Attending Surgeon: Joanie Coddington, MD    Anesthesia: Local    Pre-Procedure Diagnosis:   1. Lumbar stenosis with neurogenic claudication        Post-Procedure Diagnosis:   1. Lumbar stenosis with neurogenic claudication             Epidural Steroid Injection Lumbar/Caudal  Procedure: epidural - interlaminar    Laterality: n/a   on 07/21/2022 12:49 PM  Location: lumbar ESI with imaging -  L5-S1      Consent:   Consent obtained: verbal and written  Consent given by: patient  Risks discussed: allergic reaction, bleeding, bruising, infection, nerve damage, no change or worsening in pain and reaction to medication    Discussed with patient the purpose of the treatment/procedure, other ways of treating my condition, including no treatment/ procedure and the risks and benefits of the alternatives. Patient has decided to proceed with treatment/procedure.        Universal Protocol:  Relevant documents: relevant documents present and verified  Test results: test results available and properly labeled  Imaging studies: imaging studies available  Required items: required blood products, implants, devices, and special equipment available  Site marked: the operative site was marked  Patient identity confirmed: Patient identify confirmed verbally with patient.        Time out: Immediately prior to procedure a time out was called to verify the correct patient, procedure, equipment, support staff and site/side marked as required      Procedures Details:   Indications: pain   Prep: chlorhexidine  Patient position: prone  Estimated Blood Loss: minimal  Specimens: none  Number of Levels: 1  Guidance: fluoroscopy  Contrast: Procedure confirmed with contrast under live fluoroscopy.  Needle and Epidural Catheter: tuohy  Injection procedure: Incremental injection and Negative aspiration for blood  Patient tolerance: Patient tolerated the procedure well with no immediate complications. Pressure was applied, and hemostasis was accomplished.  Comments: DESCRIPTION OF PROCEDURE:  The procedure risks and benefits were explained to the patient and informed consent was obtained.  The patient was positioned prone on the fluoroscopy table with a pillow under the abdomen to help reduce lumbar lordosis.  Blood pressure cuff and oxygen saturation monitors were attached and the patient was monitored throughout the entire procedure.  The L5 vertebral level was identified with the use of fluoroscopy in the AP view.  The skin was prepped using Chlorhexadine and draped in aseptic fashion.  The skin and subcutaneous tissue were anesthetized using 3 mL of 1 percent lidocaine with a 27-gauge needle.  A  20-gauge Tuohy needle was slowly advanced using AP view towards the interlaminar space.  The latter part of the needle advancement was guided with fluoroscopy in the lateral view.  The epidural space was identified using loss of resistance technique.  After negative aspiration, 1 mL of contrast dye was injected.  After epidural spread was seen, a solution containing 80 mg of triamcinolone and 2 mL of normal saline was injected in increments.  The stylet was reinserted and the needle was then removed.     After the procedure, the patient's blood pressure, heart rate, oxygen saturation, and VAS were recorded in the chart. There were no complications.  The patient tolerated the procedure well and was brought to recovery room for observation in stable condition and discharged with written discharge instructions.     PLAN OF CARE:  The patient is to followup in 3 weeks.    The patient was advised to contact  the [facility] for any of the following    Fever, chills, or night sweats.  New onset of severe sharp pain.  Any new upper or lower extremity weakness or numbness.  Any questions regarding the procedure.    This patient's clinical history, exam, AND imaging support radiculopathy AND there is a significant impact on quality of life and function AND the pain has been present for at least 4 weeks AND they have failed to improve with noninvasive conservative care.            Administrations This Visit     iohexoL (OMNIPAQUE-240) 240 mg/mL injection 1 mL     Admin Date  07/21/2022 Action  Given Dose  1 mL Route  Epidural Administered By  Lizbeth Bark, RN          lidocaine PF 1% (10 mg/mL) injection 2 mL     Admin Date  07/21/2022 Action  Given Dose  2 mL Route  Injection Administered By  Lizbeth Bark, RN          sodium chloride PF 0.9% injection 3 mL     Admin Date  07/21/2022 Action  Given Dose  3 mL Route  Injection Administered By  Lizbeth Bark, RN          triamcinolone acetonide [facility]) injection 80 mg     Admin Date  07/21/2022 Action  Given Dose  80 mg Route  Epidural Administered By  Lizbeth Bark, RN              Estimated blood loss: none or minimal  Specimens: none  Patient tolerated the procedure well with no immediate complications. Pressure was applied, and hemostasis was accomplished.

## 2022-07-21 NOTE — Discharge Instructions - Supplementary Instructions
GENERAL POST PROCEDURE INSTRUCTIONS  Physician: _________________________________  Procedure Completed Today:  Joint Injection (hip, knee, shoulder)  Cervical Epidural Steroid Injection  Cervical Transforaminal Steroid Injection  Trigger Point Injection  Caudal Epidural Steroid Injection  Pudendal Nerve Block  Other _____________________ Thoracic Epidural Steroid Injection  Lumbar Epidural Steroid Injection  Lumbar Transforaminal Steroid Injection  Facet Joint Injection  Celiac Nerve Block  Sacrococcygeal  Sacroiliac Joint Injection   Important information following your procedure today:  You may drive today     If you had sedation, you may NOT drive today  Rest at home for the next 6 hours.  You may then begin to resume your normal activities.  DO NOT drive any vehicle, operate any power tools, drink alcohol, make any major decisions, or sign any legal documents for the next 12 hours.  Pain relief may not be immediate. It is possible you may even experience an increase in pain during the first 24-48 hours followed by a gradual decrease of your pain.  Though the procedure is generally safe, and complications are rare, we do ask that you be aware of any of the following:  Any swelling, persistent redness, new bleeding or drainage from the site of the injection.  You should not experience a severe headache.  You should not run a fever over 101oF.  New onset of sharp, severe back and or neck pain.  New onset of upper or lower extremity numbness or weakness.  New difficulty controlling bowel or bladder function after injection.  New shortness of breath.  ** If any of these occur, please call to report this occurrence to a nurse at [phone]. If you are calling after 4:00 p.m. or on weekends or holidays, please call [phone] and ask to have the resident physician on call for the physician paged or go to your local emergency room.  You may experience soreness at the injection site. Ice can be applied at 20-minute intervals for the first 24 hours. The following day you may alternate ice with heat if you are experiencing muscle tightness, otherwise continue with ice. Ice works best at decreasing pain. Avoid application of direct heat, hot showers or hot tubs today.  Avoid strenuous activity today. You many resume your regular activities and exercise tomorrow.  Patients with diabetes may see an elevation in blood sugars for 7-10 days after the injection. It is important to pay close attention to your diet, check your blood sugars daily and report extreme elevations to the physician that manages your diabetes.  Patients taking daily blood thinners can resume their regular dose this evening.  It is important that you take all medications ordered by your pain physician. Taking medications as ordered is an important part of your pain care plan. If you cannot continue the medication plan, please notify the physician.    Possible side effects to steroids that may occur:  Flushing or redness of the face  Irritability  Fluid retention  Change in women's menses  Minor headache    If you are unable to keep your upcoming appointment, please notify the Spine Center scheduler at [phone] at least 24 hours in advance. If you have questions for the surgery center, call [facility] at [phone].

## 2022-07-21 NOTE — Progress Notes
SPINE CENTER  INTERVENTIONAL PAIN PROCEDURE HISTORY AND PHYSICAL    Chief Complaint: Pain    HISTORY OF PRESENT ILLNESS:    Patient returns today for interventional treatment of radicular pain. Patient denies any recent fevers, chills, infection, antibiotics, coagulopathy or contra-indicated anticoagulants. Risks of the procedure were discussed including but not limited to bleeding, infection, damage to surrounding structures and reaction to medications. Patient reports understanding and has elected to proceed with the procedure.  This patient had at least 50% pain relief for at least 3 months with the last epidural injection. This patient's clinical history, exam, AND imaging support radiculopathy AND there is a significant impact on quality of life and function AND their pain score has been documented in this note AND the pain has been present for at least 4 weeks AND they have failed to improve with noninvasive conservative care.         Medical History:   Diagnosis Date   ? Abnormal cardiac function test 01/13/2022   ? Arthritis     back & shoulders   ? Cataract 5 Years ago   ? Degenerative disc disease, lumbar    ? Depression 20+ Yrs   ? Generalized headaches Most of adult life   ? Hyperlipidemia    ? Hypertension    ? Joint pain 10 yrs ago   ? Joint stiffness 2009   ? Sleep apnea    ? Spinal stenosis    ? Ulcer 12/2019       Surgical History:   Procedure Laterality Date   ? VASECTOMY  1984   ? HX CATARACT REMOVAL Bilateral 02/2017    OS [facility]   ? MENISCECTOMY Left 2019   ? PARS PLANA MECHANICAL VITRECTOMY WITH  [place] Right 09/02/2018    Performed by Blair Hailey, MD at [facility] OR   ? PARS PLANA MECHANICAL VITRECTOMY Left 11/04/2018    Performed by Blair Hailey, MD at [facility] OR   ? ANGIOGRAPHY CORONARY ARTERY WITH LEFT HEART CATHETERIZATION N/A 01/13/2022    Performed by Marcell Barlow, MD at [facility] CATH LAB   ? POSSIBLE PERCUTANEOUS CORONARY STENT PLACEMENT WITH ANGIOPLASTY N/A 01/13/2022    Performed by Marcell Barlow, MD at [facility] CATH LAB   ? HX ADENOIDECTOMY     ? HX ARTHROSCOPIC SURGERY  4 yrs ago    Left Knee Scope   ? HX JOINT REPLACEMENT  04/2021    Right Shoulder - Injury   ? HX TONSILLECTOMY     ? KNEE SURGERY  Left Knee Scope 4 yrs ago   ? NASAL SURGERY      TWICE   ? UMBILICAL ARTERIAL CATH - BEDSIDE  Less than a year    No Stents Placed       family history includes Arthritis in his father and mother; Back pain in his brother, father, and mother; Cataract in his father and mother; Depression in his brother; Heart Disease in his father; Heart problem in his father; Hypertension in his father; Joint Pain in his brother, father, and mother; Osteoporosis in his mother.    Social History     Socioeconomic History   ? Marital status: Married     Spouse name: Bonita Quin   Tobacco Use   ? Smoking status: Former     Packs/day: 1.00     Years: 10.00     Pack years: 10.00     Types: Cigarettes, Pipe, Cigars     Start date: 08/04/1967  Quit date: 12/22/2006     Years since quitting: 15.5   ? Smokeless tobacco: Former   ? Tobacco comments:     15 + yrs since last smoked   Vaping Use   ? Vaping Use: Never used   Substance and Sexual Activity   ? Alcohol use: Not Currently     Alcohol/week: 2.0 standard drinks of alcohol     Types: 2 Cans of beer per week     Comment: Less than 10 Beers a year.   ? Drug use: Never   ? Sexual activity: Yes     Partners: Female     Birth control/protection: None       Allergies   Allergen Reactions   ? Seasonal Allergies SNEEZING           REVIEW OF SYSTEMS: 10 point ROS obtained and negative except as above.      PHYSICAL EXAM:  General: Alert, cooperative, no acute distress.  HEENT: Normocephalic, atraumatic.  Neck: Supple.  Lungs: Unlabored respirations, bilateral and equal chest excursion.  Heart: Regular rate.  Skin: Warm and dry to touch.  Abdomen: Nondistended.  MSK: No deformity  Neurological: Alert and oriented x3.        IMPRESSION:    1. Lumbar stenosis with neurogenic claudication PLAN:   L5-S1 ILESI  ?  Patient was seen, examined, and discussed with attending physician, Dr. Katrinka Blazing, who agrees with assessment and plan.  ?  Aurelio Brash MD  Interventional Spine & MSK Medicine Fellow  PM&R

## 2022-07-30 ENCOUNTER — Encounter: Admit: 2022-07-30 | Discharge: 2022-07-30 | Payer: BC Managed Care – PPO

## 2022-07-30 ENCOUNTER — Ambulatory Visit: Admit: 2022-07-30 | Discharge: 2022-07-30 | Payer: BC Managed Care – PPO

## 2022-07-30 DIAGNOSIS — R9430 Abnormal result of cardiovascular function study, unspecified: Secondary | ICD-10-CM

## 2022-07-30 DIAGNOSIS — I10 Essential (primary) hypertension: Secondary | ICD-10-CM

## 2022-07-30 DIAGNOSIS — M5136 Other intervertebral disc degeneration, lumbar region: Secondary | ICD-10-CM

## 2022-07-30 DIAGNOSIS — G4730 Sleep apnea, unspecified: Secondary | ICD-10-CM

## 2022-07-30 DIAGNOSIS — M4800 Spinal stenosis, site unspecified: Secondary | ICD-10-CM

## 2022-07-30 DIAGNOSIS — F32A Depression: Secondary | ICD-10-CM

## 2022-07-30 DIAGNOSIS — M4316 Spondylolisthesis, lumbar region: Secondary | ICD-10-CM

## 2022-07-30 DIAGNOSIS — IMO0002 Ulcer: Secondary | ICD-10-CM

## 2022-07-30 DIAGNOSIS — M48062 Spinal stenosis, lumbar region with neurogenic claudication: Secondary | ICD-10-CM

## 2022-07-30 DIAGNOSIS — M1990 Unspecified osteoarthritis, unspecified site: Secondary | ICD-10-CM

## 2022-07-30 DIAGNOSIS — M2560 Stiffness of unspecified joint, not elsewhere classified: Secondary | ICD-10-CM

## 2022-07-30 DIAGNOSIS — M2550 Pain in unspecified joint: Secondary | ICD-10-CM

## 2022-07-30 DIAGNOSIS — M48061 Spinal stenosis, lumbar region without neurogenic claudication: Secondary | ICD-10-CM

## 2022-07-30 DIAGNOSIS — H269 Unspecified cataract: Secondary | ICD-10-CM

## 2022-07-30 DIAGNOSIS — E785 Hyperlipidemia, unspecified: Secondary | ICD-10-CM

## 2022-07-30 DIAGNOSIS — M4150 Other secondary scoliosis, site unspecified: Secondary | ICD-10-CM

## 2022-07-30 DIAGNOSIS — R519 Generalized headaches: Secondary | ICD-10-CM

## 2022-08-10 ENCOUNTER — Encounter: Admit: 2022-08-10 | Discharge: 2022-08-10 | Payer: BC Managed Care – PPO

## 2022-08-10 MED ORDER — FOLIC ACID 1 MG PO TAB
ORAL_TABLET | 2 refills
Start: 2022-08-10 — End: ?

## 2022-08-10 MED ORDER — ROSUVASTATIN 20 MG PO TAB
ORAL_TABLET | 1 refills
Start: 2022-08-10 — End: ?

## 2022-08-11 ENCOUNTER — Encounter: Admit: 2022-08-11 | Discharge: 2022-08-11 | Payer: BC Managed Care – PPO

## 2022-08-19 ENCOUNTER — Encounter: Admit: 2022-08-19 | Discharge: 2022-08-19 | Payer: BC Managed Care – PPO

## 2022-08-19 ENCOUNTER — Inpatient Hospital Stay: Admit: 2022-08-19 | Discharge: 2022-08-19 | Payer: BC Managed Care – PPO

## 2022-08-19 DIAGNOSIS — M4316 Spondylolisthesis, lumbar region: Secondary | ICD-10-CM

## 2022-08-19 DIAGNOSIS — Z01818 Encounter for other preprocedural examination: Secondary | ICD-10-CM

## 2022-08-19 DIAGNOSIS — M48062 Spinal stenosis, lumbar region with neurogenic claudication: Secondary | ICD-10-CM

## 2022-08-19 MED ORDER — LACTATED RINGERS IV SOLP
1000 mL | INTRAVENOUS | 0 refills
Start: 2022-08-19 — End: ?

## 2022-08-19 MED ORDER — PRESURGERY KIT B
PACK | 0 refills | Status: AC
Start: 2022-08-19 — End: ?

## 2022-08-19 MED ORDER — PRESURGERY KIT B
PACK | 0 refills | Status: CN
Start: 2022-08-19 — End: ?

## 2022-08-19 MED ORDER — CELECOXIB 200 MG PO CAP
200 mg | Freq: Once | ORAL | 0 refills
Start: 2022-08-19 — End: ?

## 2022-08-19 MED ORDER — CEFAZOLIN INJ 1GM IVP
2 g | Freq: Once | INTRAVENOUS | 0 refills
Start: 2022-08-19 — End: ?

## 2022-08-19 MED ORDER — ACETAMINOPHEN 500 MG PO TAB
1000 mg | Freq: Once | ORAL | 0 refills
Start: 2022-08-19 — End: ?

## 2022-09-15 ENCOUNTER — Encounter: Admit: 2022-09-15 | Discharge: 2022-09-15 | Payer: BC Managed Care – PPO

## 2022-09-15 ENCOUNTER — Ambulatory Visit: Admit: 2022-09-15 | Discharge: 2022-09-16 | Payer: BC Managed Care – PPO

## 2022-09-15 DIAGNOSIS — I10 Essential (primary) hypertension: Secondary | ICD-10-CM

## 2022-09-15 DIAGNOSIS — M4800 Spinal stenosis, site unspecified: Secondary | ICD-10-CM

## 2022-09-15 DIAGNOSIS — IMO0002 Ulcer: Secondary | ICD-10-CM

## 2022-09-15 DIAGNOSIS — R002 Palpitations: Secondary | ICD-10-CM

## 2022-09-15 DIAGNOSIS — E785 Hyperlipidemia, unspecified: Secondary | ICD-10-CM

## 2022-09-15 DIAGNOSIS — R519 Generalized headaches: Secondary | ICD-10-CM

## 2022-09-15 DIAGNOSIS — M1990 Unspecified osteoarthritis, unspecified site: Secondary | ICD-10-CM

## 2022-09-15 DIAGNOSIS — R931 Abnormal findings on diagnostic imaging of heart and coronary circulation: Secondary | ICD-10-CM

## 2022-09-15 DIAGNOSIS — M5136 Other intervertebral disc degeneration, lumbar region: Secondary | ICD-10-CM

## 2022-09-15 DIAGNOSIS — E782 Mixed hyperlipidemia: Secondary | ICD-10-CM

## 2022-09-15 DIAGNOSIS — R9430 Abnormal result of cardiovascular function study, unspecified: Secondary | ICD-10-CM

## 2022-09-15 DIAGNOSIS — G4730 Sleep apnea, unspecified: Secondary | ICD-10-CM

## 2022-09-15 DIAGNOSIS — F32A Depression: Secondary | ICD-10-CM

## 2022-09-15 DIAGNOSIS — H269 Unspecified cataract: Secondary | ICD-10-CM

## 2022-09-15 DIAGNOSIS — M2550 Pain in unspecified joint: Secondary | ICD-10-CM

## 2022-09-15 DIAGNOSIS — M48061 Spinal stenosis, lumbar region without neurogenic claudication: Secondary | ICD-10-CM

## 2022-09-15 DIAGNOSIS — M2560 Stiffness of unspecified joint, not elsewhere classified: Secondary | ICD-10-CM

## 2022-09-15 NOTE — Patient Instructions
We have made no adjustment in your current medications    Your exam is normal      No further testing at this time, okay for surgery from my standpoint    Jody will see you in 6 months.  I will see you in 12 months    I work with a wonderful group of nurses that are part of the "purple" team.  There is a shared phone number for this group of nurses, [phone].  Please contact this number should you have any questions about your visit today or concerns about upcoming appointments/procedures.  We would like to know if you are having any worrisome cardiac symptoms.     Please call [phone] if needing to schedule an office visit.     We are also available by e-mail through the [facility] system if you have non-urgent concerns. We will get back to you as soon as possible. For urgent or after hours needs, please call [phone].

## 2022-09-16 DIAGNOSIS — Z01818 Encounter for other preprocedural examination: Secondary | ICD-10-CM

## 2022-09-30 ENCOUNTER — Encounter: Admit: 2022-09-30 | Discharge: 2022-09-30 | Payer: BC Managed Care – PPO

## 2022-11-04 ENCOUNTER — Encounter: Admit: 2022-11-04 | Discharge: 2022-11-04 | Payer: BC Managed Care – PPO

## 2022-11-04 NOTE — Patient Instructions
It was a pleasure seeing you in clinic today.  Your tentative surgery date is Monday 11/24/2022    Your surgery is Anterior/ lateral (left) /Posterior Lumbar two -lumbar Five decompression and fusion with infuse and allograft. This is a 7 hour surgery          Surgery will be at the [facility] with Dr. Laneta Simmers  [address], [place], [place]  [address]    You will need to check into admitting the day of your surgery at 0515 am  Admitting is located inside the main entrance on the left across from the information desk in the hospital lobby.  You will receive a call from the surgical team the day prior to your surgery after 2 pm, to inform you of what time you will need to arrive in admitting the day of your surgery.  If you have not been contacted by 4:30 pm the day prior your surgery date, please call [phone] or [phone].    Valet parking is available Monday through Friday from 7 AM to 4 PM.  Have your parking ticket validated at the Information Desk in the hospital lobby.  Parking is available in either the [place] parking garage or the [place] parking garage.    *Do not eat or drink (including gum, mints, candy, or chewing tobacco) anything after midnight-12 am the night before your surgery.    CHG (Chlorhexidine Gluconate) an antimicrobial scrub, shower twice prior to surgery-night before and morning of surgery.    *The morning of your surgery brush your teeth and tongue, ok to rinse, but do not drink any water.    *You may take your medications with a sip of water if instructed by your physician to do so.  At your appointment with PAT (pre-anesthesia testing) they will instruct you on what medications to take on the morning of surgery. If unsure, please clarify prior to surgery date.    [facility]: [phone]  If you need to cancel your procedure or if you are going to be late        Anticipated hospital stay depending on your surgery and your plan of care is 1-3 days including the day of surgery.    You may or may not have a drain in place when you wake up, but you will not go home with it.     Walking is great, but do not get up without assistance while you are here in the hospital, even to go to the bathroom.     Physical and Occupational Therapy will evaluate you during your stay.     Your first postoperative  follow up appointment will be 4 weeks following discharge.  A dressing will be placed over your incision prior to d/c from the hospital and needs to remain intact for 1 week. After, change dressing every other day, until your incision is healed. Change sooner if dressing becomes saturated.  Your sutures will be removed at your first post-op visit.  Do not shower until after your sutures have been removed. Your incision must stay completely dry after surgery. DO NOT submerge or soak your incision site. No swimming pools, lakes,  baths or hot tubs.  Please report any concerns with your incision.    You will have post-operative pain, however, opioid pain medications will be prescribed. You will receive a prescription upon discharge from the hospital. If pain is not effectively controlled, please notify our office.  Any additional refills will be provided, if necessary, in your post operative appointment. Goal is to be tapering down and off opioids around the 6 week post operative date.    Pain medication can be constipating, stay hydrated, continue stool softeners to maintain regular bowel movement while on pain medication.    No driving until your first follow up visit and as long as you are taking opioid pain medication.    After surgery you will have restrictions including no more than 5 lbs lifting, as well as no bending, twisting, stooping, squatting, crawling, pushing, pulling through the back and/or neck for 2-3 months.    The anticipated recovery time frame for this surgery is 2-3 months.     If FMLA or Short Term Disability will be used, please provide this paperwork ASAP to Norval Morton at phone number [phone] Fax number [phone]    If you have any questions or concerns regarding you insurance coverage and /or out of pocket expenses regarding this procedure/surgery, please feel to contact the financial counselor [phone].        Jen Mow RN, CNC  Dr. Ivin Booty T. Bunch/Ortho Spine Surgery  [facility]  [facility]  [address]  [place], [place] [address]  Phone: [phone]   Fax [phone]  Scheduling [phone]  [email or web address]    We appreciate the interest in your health.  The physician reviews both the radiology reports and the imaging independently and will contact you if there are any emergent findings.  If you would like, incidental and chronic changes seen in your images can be reviewed with Dr. Liam Rogers at a follow up appointment.  At that appointment, the radiology report and terminology used in that report can also be explained. Please call our Schedule line if you would like to follow up.

## 2022-11-05 ENCOUNTER — Encounter: Admit: 2022-11-05 | Discharge: 2022-11-05 | Payer: BC Managed Care – PPO

## 2022-11-05 ENCOUNTER — Ambulatory Visit: Admit: 2022-11-05 | Discharge: 2022-11-05 | Payer: BC Managed Care – PPO

## 2022-11-05 DIAGNOSIS — M2560 Stiffness of unspecified joint, not elsewhere classified: Secondary | ICD-10-CM

## 2022-11-05 DIAGNOSIS — F419 Anxiety disorder, unspecified: Secondary | ICD-10-CM

## 2022-11-05 DIAGNOSIS — M545 Chronic left-sided low back pain without sciatica: Secondary | ICD-10-CM

## 2022-11-05 DIAGNOSIS — M1990 Unspecified osteoarthritis, unspecified site: Secondary | ICD-10-CM

## 2022-11-05 DIAGNOSIS — I10 Essential (primary) hypertension: Secondary | ICD-10-CM

## 2022-11-05 DIAGNOSIS — M4800 Spinal stenosis, site unspecified: Secondary | ICD-10-CM

## 2022-11-05 DIAGNOSIS — H269 Unspecified cataract: Secondary | ICD-10-CM

## 2022-11-05 DIAGNOSIS — G4730 Sleep apnea, unspecified: Secondary | ICD-10-CM

## 2022-11-05 DIAGNOSIS — E782 Mixed hyperlipidemia: Secondary | ICD-10-CM

## 2022-11-05 DIAGNOSIS — K21 Gastroesophageal reflux disease with esophagitis without hemorrhage: Secondary | ICD-10-CM

## 2022-11-05 DIAGNOSIS — M5136 Other intervertebral disc degeneration, lumbar region: Secondary | ICD-10-CM

## 2022-11-05 DIAGNOSIS — M4316 Spondylolisthesis, lumbar region: Secondary | ICD-10-CM

## 2022-11-05 DIAGNOSIS — K219 Gastro-esophageal reflux disease without esophagitis: Secondary | ICD-10-CM

## 2022-11-05 DIAGNOSIS — E785 Hyperlipidemia, unspecified: Secondary | ICD-10-CM

## 2022-11-05 DIAGNOSIS — R519 Generalized headaches: Secondary | ICD-10-CM

## 2022-11-05 DIAGNOSIS — H9190 Unspecified hearing loss, unspecified ear: Secondary | ICD-10-CM

## 2022-11-05 DIAGNOSIS — R7301 Impaired fasting glucose: Secondary | ICD-10-CM

## 2022-11-05 DIAGNOSIS — M48062 Spinal stenosis, lumbar region with neurogenic claudication: Secondary | ICD-10-CM

## 2022-11-05 DIAGNOSIS — Z01818 Encounter for other preprocedural examination: Secondary | ICD-10-CM

## 2022-11-05 DIAGNOSIS — IMO0002 Ulcer: Secondary | ICD-10-CM

## 2022-11-05 DIAGNOSIS — R9430 Abnormal result of cardiovascular function study, unspecified: Secondary | ICD-10-CM

## 2022-11-05 DIAGNOSIS — I2510 Atherosclerotic heart disease of native coronary artery without angina pectoris: Secondary | ICD-10-CM

## 2022-11-05 DIAGNOSIS — Z125 Encounter for screening for malignant neoplasm of prostate: Secondary | ICD-10-CM

## 2022-11-05 DIAGNOSIS — F32A Depression: Secondary | ICD-10-CM

## 2022-11-05 DIAGNOSIS — M2550 Pain in unspecified joint: Secondary | ICD-10-CM

## 2022-11-05 LAB — COMPREHENSIVE METABOLIC PANEL
ALBUMIN: 4.6 g/dL (ref 3.5–5.0)
ALK PHOSPHATASE: 70 U/L (ref 25–110)
ALT: 21 U/L (ref 7–56)
ANION GAP: 9 (ref 3–12)
AST: 19 U/L (ref 7–40)
CALCIUM: 9.6 mg/dL (ref 8.5–10.6)
CHLORIDE: 105 MMOL/L (ref 98–110)
CO2: 26 MMOL/L (ref 21–30)
EGFR: >60 mL/min (ref >60)
POTASSIUM: 4.2 MMOL/L (ref 3.5–5.1)
SODIUM: 140 MMOL/L (ref 137–147)
TOTAL BILIRUBIN: 0.5 mg/dL (ref 0.3–1.2)
TOTAL PROTEIN: 7.4 g/dL (ref 6.0–8.0)

## 2022-11-05 LAB — LIPID PROFILE
CHOLESTEROL: 108 mg/dL (ref <200)
HDL: 52 mg/dL (ref >40)
LDL: 45 mg/dL (ref <100)
NON HDL CHOLESTEROL: 56 mg/dL
TRIGLYCERIDES: 86 mg/dL (ref <150)
VLDL: 17 mg/dL

## 2022-11-05 LAB — CBC AND DIFF
ABSOLUTE BASO COUNT: 0 K/UL (ref 0–0.20)
ABSOLUTE EOS COUNT: 0.1 K/UL (ref 0–0.45)
ABSOLUTE LYMPH COUNT: 1.2 K/UL (ref 1.0–4.8)
ABSOLUTE MONO COUNT: 0.4 K/UL (ref 0–0.80)
ABSOLUTE NEUTROPHIL: 3 K/UL (ref 1.8–7.0)
BASOPHILS %: 0 % (ref 0–2)
EOSINOPHILS %: 3 % (ref 0–5)
HEMATOCRIT: 41 % (ref 40–50)
HEMOGLOBIN: 14 g/dL (ref 13.5–16.5)
LYMPHOCYTES %: 25 % (ref 24–44)
MCH: 31 pg (ref 26–34)
MCHC: 34 g/dL (ref 32.0–36.0)
MCV: 93 FL (ref 80–100)
MONOCYTES %: 10 % (ref 4–12)
MPV: 6.4 FL — ABNORMAL LOW (ref 7–11)
NEUTROPHILS %: 62 % (ref 41–77)
PLATELET COUNT: 229 K/UL (ref 150–400)
RBC COUNT: 4.3 M/UL — ABNORMAL LOW (ref 4.4–5.5)
RDW: 13 % (ref 11–15)
WBC COUNT: 4.8 K/UL (ref 4.5–11.0)

## 2022-11-05 LAB — PROTIME INR (PT): PROTIME: 11 s (ref 9.5–14.2)

## 2022-11-05 LAB — PREALBUMIN: PREALBUMIN: 33 mg/dL (ref 17–34)

## 2022-11-05 LAB — HEMOGLOBIN A1C: HEMOGLOBIN A1C: 5.2 % (ref 4.0–5.7)

## 2022-11-05 LAB — PTT (APTT): PTT: 30 s (ref 24.0–36.5)

## 2022-11-05 LAB — MICROALB/CR RATIO-URINE RANDOM: UR CREATININE, RAN: 32 mg/dL (ref 0.3–1.2)

## 2022-11-05 LAB — TSH WITH FREE T4 REFLEX: TSH: 2.2 uU/mL (ref 0.35–5.00)

## 2022-11-05 LAB — PSA SCREEN: PSA SCREEN: 0.2 ng/mL (ref <4.01)

## 2022-11-05 NOTE — Unmapped
[facility] Anesthesia Pre-Procedure Evaluation    Name: Jared Hebert      MRN: [id number]     DOB: 1951/02/13     Age: 71 y.o.     Sex: male   _________________________________________________________________________     Procedure Info:   Procedure Information     Date/Time: 11/24/22 0715    Procedure: Anterior/ lateral (left) /Posterior Lumbar two -lumbar Five decompression and fusion with infuse and allograft. (Left) - 7 hrs, regular table. Jean Rosenthal. prone. Supine. Lateral. C arm. Globus.    Location: MAIN OR 37 / Main OR/Periop    Surgeons: Criss Rosales, MD          Physical Assessment  Vital Signs (last filed in past 24 hours):  BP: 144/87 (11/15 1229)  Temp: 35.9 ?C (96.6 ?F) (11/15 1229)  Pulse: 64 (11/15 1229)  Respirations: 14 PER MINUTE (11/15 1229)  SpO2: 98 % (11/15 1229)  O2 Device: None (Room air) (11/15 1229)  Height: 175.3 cm (5' 9) (11/15 1229)  Weight: 81 kg (178 lb 9.6 oz) (11/15 1229)  Admission / Dosing Weight: 81 kg (178 lb 9.6 oz) (11/15 1229)      Patient History   Allergies   Allergen Reactions   ? Seasonal Allergies SNEEZING        Current Medications    Medication Directions   ascorbic acid (VITAMIN C) 500 mg tablet Take one tablet by mouth daily.   cetirizine (ZYRTEC) 10 mg tablet Take one tablet by mouth every morning.   citalopram (CELEXA) 40 mg tablet Take one tablet by mouth daily.  Patient taking differently: Take one tablet by mouth every morning.   cyclobenzaprine (FLEXERIL) 5 mg tablet Take one tablet to two tablets by mouth daily as needed.   ezetimibe (ZETIA) 10 mg tablet TAKE 1 TABLET BY MOUTH EVERY DAY  Patient taking differently: Take one tablet by mouth every morning.   fluticasone propionate (FLONASE) 50 mcg/actuation nasal spray, suspension Apply one spray to each nostril as directed at bedtime daily. Shake bottle gently before using.   folic acid (FOLVITE) 1 mg tablet TAKE 1 TABLET BY MOUTH EVERY DAY  Patient taking differently: Take one tablet by mouth every morning. gabapentin (NEURONTIN) 300 mg capsule TAKE 1 CAPSULE BY MOUTH EVERY 8 HOURS  Patient taking differently: Take one capsule by mouth at bedtime daily.   GLUCOSAMINE SULFATE PO Take 1,500 mg by mouth every morning.   losartan (COZAAR) 25 mg tablet Take one tablet by mouth daily.  Patient taking differently: Take one tablet by mouth every morning.   meloxicam (MOBIC) 15 mg tablet TAKE 1 TABLET BY MOUTH EVERY DAY  Patient taking differently: Take one tablet by mouth every morning.   omeprazole DR (PRILOSEC) 40 mg capsule Take one capsule by mouth daily before breakfast.   rosuvastatin (CRESTOR) 20 mg tablet TAKE 1 TABLET BY MOUTH EVERY DAY  Patient taking differently: Take one tablet by mouth every morning.   zolpidem (AMBIEN) 5 mg tablet Take one tablet by mouth at bedtime as needed for Sleep.       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)      Pulmonary  Not a current smoker (former smoker; quit 2008 10PYH)      no COPD      No recent URI      No shortness of breath      No Obstructive Sleep Apnea      Cardiovascular         Exercise tolerance: >4 METS (denies DOE/CP; activities limited from leg weakness; same activity tolerance since cardiology OV 08/2022)      Beta Blocker therapy: No      Beta blockers within 24 hours: n/a      Hypertension (home BPs 130s/70s), well controlled    No valvular problems/murmurs          No past MI    Coronary artery disease ( moderate nonobstructive of mid LAD, ramus intermedius and mid RCA on cath 12/2021: medical management)      Palpitations        Hyperlipidemia (statin)      No dyspnea on exertion      No syncope      GI/Hepatic/Renal             GERD (PPI), well controlled      Peptic ulcer disease        No liver disease:      No renal disease:          Neuro/Psych         No CVA      Headaches (Chronic tension headaches )      Sensory deficit (HOH- hearing aids)        Psychiatric history          Depression      BILATERAL HEARING LOSS   INSOMNIA       Musculoskeletal         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      Not obese         Physical Exam    Airway Findings      Mallampati: III      TM distance: >3 FB      Neck ROM: full      Mouth opening: good    Cardiovascular Findings:       Rhythm: regular      Rate: normal      No murmur, no carotid bruit, no peripheral edema    Pulmonary Findings:       Breath sounds clear to auscultation.    Neurological Findings:       Alert and oriented x 3       Diagnostic Tests  Hematology:   Lab Results   Component Value Date    HGB 13.5 03/22/2022    HCT 39.7 03/22/2022    PLTCT 210 03/22/2022    WBC 5.1 03/22/2022    NEUT 56 03/22/2022    ANC 2.89 03/22/2022    ALC 1.28 03/22/2022    MONA 11 03/22/2022    AMC 0.54 03/22/2022    EOSA 7 03/22/2022    ABC 0.03 03/22/2022    MCV 93.8 03/22/2022    MCH 31.8 03/22/2022    MCHC 33.9 03/22/2022    MPV 6.6 03/22/2022    RDW 12.7 03/22/2022  General Chemistry:   Lab Results   Component Value Date    NA 140 03/22/2022    K 4.4 03/22/2022    CL 107 03/22/2022    CO2 27 03/22/2022    GAP 6 03/22/2022    BUN 38 03/22/2022    CR 0.75 03/22/2022    GLU 98 03/22/2022    CA 9.0 03/22/2022    ALBUMIN 4.4 03/22/2022    TOTBILI 0.3 03/22/2022      Coagulation: No results found for: PT, PTT, INR    Cardiac Cath 01/13/2021 (performed due to CTA results)  1. Moderate nonobstructive coronary artery disease involving the mid left anterior descending, ramus intermedius, and the mid right coronary artery as described above.  2. Normal left ventricular end-diastolic pressure.  3. No significant gradient across the aortic valve on pullback.  4. We measured instantaneous wave-free ratio across the mid left anterior descending and it was 0.94, which suggests that the lesion was not hemodynamically significant.  The IFR across the diagonal branch was 0.98.  5. We measured IFR across the ramus intermedius and it was 0.96, suggesting that the lesion was not hemodynamically significant.  ?  RECOMMENDATIONS:  Continue medical management and aggressive risk factor modification.    CTA coronary 11/2021: moderate to severe three-vessel disease    Echo 10/25/2021:  ? Normal left ventricular systolic function with an ejection fraction of 60-65%.  LV wall thickness.  ? Normal right ventricular size and function.  ? Normal diastolic function.  ? No regional wall motion abnormality.  ? Mild MR.  ? No pericardial effusion.  ? Visualized portions of the aortic root and ascending thoracic aorta are within normal limits.  ? CVP 3 mmHg.  Peak PA systolic pressure 24 mmHg.  ?  Compared to a prior 2020 study.  Continued preservation of LV systolic function.  No interval development of any clinically significant valve disease or regional wall motion abnormalities.  The prior study was notable for mild left ventricular diastolic dysfunction.  Per criteria, no evidence for diastolic dysfunction identified.    Regadenoson MPI stress 10/25/2021:  This myocardial pressure imaging study did not demonstrate any ischemia on the tomographic pattern, it was no evidence of fixed or reversible perfusion abnormalities.  Normal left ventricular systolic function, LVEF = 60%, no regional wall motion abnormalities.  Transient ischemic dilatation was present.  Normal pulmonary to myocardial count ratio, normal left ventricular end-diastolic volume.   The pharmacologic ECG portion of the study is negative for ischemia.  ?  Comparison is made with a prior thallium study performed on the ADAC camera completed 11/10/2019.  Ejection fraction was 70%, ischemia was not present on that study..  There are no significant changes.  ?  In aggregate the current study is low risk in regards to predicted annual cardiovascular mortality rate.      Cardiology OV 09/15/2022:   1. Preoperative evaluation-Mr. Delorenzo was scheduled for an extensive back surgery in 3 months.  This will be an intermediate to high risk surgery.  As detailed above, he is reporting no concerning anginal symptoms.  He had a heart catheterization with extensive FFR evaluation of multiple lesions in January, ultimately it was felt that the lesions were moderate, not severe.  His individual is asymptomatic.  He should proceed to the operating room as planned.  No further testing is planned given his echo, stress test, and subsequent heart catheterization, all within the last 12 months.  He is felt to be low  risk for perioperative cardiac morbidity or mortality.     Pulmonary OV 05/28/2021:  discuss OSA and insomnia  Home sleep test ordered. Results of home sleep test 01/08/2022: no significant OSA or desaturations so no need to restart CPAP therapy.  No need to follow up with pulmonary      [facility] Plan    Interview: Clinic Interview    ASA Score: 3    Anesthesia Options Discussed: General              Labs/Studies Ordered Prior to DOS: (labs per surgeon and PCP: a1c)  CBC, CMP, PT/INR, PTT and T&S  DOS Orders:  T&C            PAC RISK ASSESSMENTS:   *Duke Activity Status Index (DASI): 24.2  , Calculated METS: 5.72   (score < 25 correlates with increased risk for death, MI, and moderate to severe complications, max score 57)  *Revised Cardiac Risk Index (RCRI): 1 points=0.9% risk for major adverse cardiac events (myocardial infarction, pulmonary edema, cardiac arrest)  *Mini Cog Score: 5   (0-2 high risk for cognitive impairment & delirium, 3-5 low risk)  *Clinical Frailty Score:  3   (score >3 [of 9] increased risk for death, post-op delirium, and discharge to specialty facility)        Alerts

## 2022-11-05 NOTE — Pre-Anesthesia Patient Instructions
PREPROCEDURE INFORMATION    Arrival at the hospital  [facility]  [address]  [place], [place] [address]    Park in the [employer], located directly across from the main entrance to the hospital.  Enter through the ground floor main hospital entrance and check in at the Information Desk in the lobby.  They will validate your parking ticket and direct you to the next location.  If you are a woman between the ages of 9 and 49, and have not had a hysterectomy, you will be asked for a urine sample prior to surgery.  Please do not urinate before arriving in the Surgery Waiting Room.  Once there, check in and let the attendant know if you need to provide a sample.    You will receive a call with your surgery arrival time between 2:30pm and 4:30pm the last business day before your procedure.  If you do not receive a call, please call [phone] before 4:30pm or [phone] after 4:30pm.    Eating or drinking before surgery  Follow the instructions you received from your surgeon and your pre-surgery kit.  Nothing to eat after 11:00pm the night before your surgery including gum, mints and candy. You may have clear liquids up to 2 hours before your surgery time.     If you have received specific instructions from your surgeon, please follow those.     Clear Liquid Examples:   Water  Clear juice - Apple or cranberry (no pulp or orange juice) - If diabetic blood sugar must be <200  Coffee and tea with or without sugar (no cream)   Sports drinks - Powerade/Gatorade   Soda   Bowel Prep solutions only if ordered by your surgeon     Planning transportation for outpatient procedure  For your safety, you will need to arrange for a responsible ride/person to accompany you home due to sedation or anesthesia with your procedure.  An [employer], taxi or other public transportation driver is not considered a responsible person to accompany you home.    Bath/Shower Instructions  Put on clean clothes after bath or shower. Avoid using lotion and oils.  Sleep on clean sheets if bath or shower is done the night before procedure.  Wash using the antibacterial wipes you received in your pre-surgery kit as directed.    Morning of your procedure:  Brush your teeth and tongue  Do not smoke, vape, chew or user any tobacco products.  Do not shave the area where you will have surgery.  Remove nail polish, makeup and all jewelry (including piercings) before coming to the hospital.  Dress in clean, loose, comfortable clothing.    Valuables  Leave money, credit cards, jewelry, and any other valuables at home. [facility] is not responsible for the loss or breakage of personal items.    What to bring to the hospital  ID/Insurance card  Medical Device card  Official documents for legal guardianship  Copy of your Living Will, Advanced Directives, and/or Durable Power of Attorney. If you have these documents, please bring them to the admissions office on the day of your surgery to be scanned into your records.  Do not bring medications from home unless instructed by a pharmacist.  CPAP/BiPAP machine (including all supplies)  Walker, cane, or motorized scooter  Cases for glasses/hearing aids/contact lens (bring solutions for contacts)  Stimulator remote  Insulin pump and supplies as directed by pharmacy     Notify us  at [facility]: [phone] on the day of your procedure if:  You need to cancel your procedure.  You are going to be late.    Notify your surgeon if:  There is a possibility that you are pregnant.  You become ill with a cough, fever, sore throat, nausea, vomiting or flu-like symptoms.  You have any open wounds/sores that are red, painful, draining, or are new since you last saw the doctor.  You need to cancel your procedure.    Preparing to get your medications at discharge  Your surgeon may prescribe you medications to take after your procedure.  If you like the convenience of having your medications filled here at [facility], please do the following:  Go to [facility] after your [facility] appointment to put a credit card on file.  Call [facility] at [phone] (Monday-Friday 7am-9pm or Saturday and Sunday 9am-5pm) to put a credit card on file.  Bring a credit card or cash on the day of your procedure- please leave with a family member rather than bringing it into the preop area.    Current Visitor Policy:  Visitors must be free of fever and symptoms to be in our facilities.  No more than 2 visitors per patient are allowed.  Additional guidelines may vary, based on patient care area or patient's condition.  Patients in semiprivate rooms may have visitors, but visits should be coordinated so only two total visitors are in a room at a time due to space limitations.  Children younger than age 51 are allowed to visit inpatients.    Thank you for participating in your Preoperative Assessment Clinic visit today.    If you have any changes to your health or hospitalizations between now and your surgery, please call us at [phone] for Main instructions.    Instructions given to patient via: verbal

## 2022-11-10 ENCOUNTER — Encounter: Admit: 2022-11-10 | Discharge: 2022-11-10 | Payer: BC Managed Care – PPO

## 2022-11-10 DIAGNOSIS — E782 Mixed hyperlipidemia: Secondary | ICD-10-CM

## 2022-11-10 DIAGNOSIS — I10 Essential (primary) hypertension: Secondary | ICD-10-CM

## 2022-11-10 DIAGNOSIS — K21 Gastroesophageal reflux disease with esophagitis without hemorrhage: Secondary | ICD-10-CM

## 2022-11-10 DIAGNOSIS — F5101 Primary insomnia: Secondary | ICD-10-CM

## 2022-11-10 DIAGNOSIS — F419 Anxiety disorder, unspecified: Secondary | ICD-10-CM

## 2022-11-10 DIAGNOSIS — Z0000 Encounter for general adult medical examination without abnormal findings: Secondary | ICD-10-CM

## 2022-11-10 MED ORDER — ROSUVASTATIN 20 MG PO TAB
ORAL_TABLET | ORAL | 0 refills | 90.00000 days | Status: AC
Start: 2022-11-10 — End: ?

## 2022-11-10 MED ORDER — CITALOPRAM 40 MG PO TAB
40 mg | ORAL_TABLET | Freq: Every day | ORAL | 0 refills | Status: AC
Start: 2022-11-10 — End: ?

## 2022-11-10 NOTE — Telephone Encounter
Some refill protocol elements NOT Met  Medication name: Celexa  Medication Strength: 40mg tablet      Manual message review   Patient is < 69 years old. Beers Criteria: Use with caution in patients 65+    Patient has tolerated in the past.  Will refill per protocol.     Dawnyel Leven, RN

## 2022-11-16 ENCOUNTER — Encounter: Admit: 2022-11-16 | Discharge: 2022-11-16 | Payer: BC Managed Care – PPO

## 2022-11-17 ENCOUNTER — Encounter: Admit: 2022-11-17 | Discharge: 2022-11-17 | Payer: BC Managed Care – PPO

## 2022-11-17 MED ORDER — PRESURGERY KIT B
PACK | 0 refills | Status: AC
Start: 2022-11-17 — End: ?
  Filled 2022-11-18: qty 1, 1d supply, fill #1

## 2022-11-18 ENCOUNTER — Encounter: Admit: 2022-11-18 | Discharge: 2022-11-18 | Payer: BC Managed Care – PPO

## 2022-11-24 ENCOUNTER — Encounter: Admit: 2022-11-24 | Discharge: 2022-11-24 | Payer: BC Managed Care – PPO

## 2022-11-24 ENCOUNTER — Inpatient Hospital Stay: Admit: 2022-11-24 | Discharge: 2022-11-24 | Payer: BC Managed Care – PPO

## 2022-11-24 MED ORDER — KETAMINE 10 MG/ML IJ SOLN (INFUSION)(AM)(OR)
INTRAVENOUS | 0 refills | Status: AC
Start: 2022-11-24 — End: ?

## 2022-11-24 MED ORDER — SUCCINYLCHOLINE CHLORIDE 20 MG/ML IJ SOLN
INTRAVENOUS | 0 refills | Status: AC
Start: 2022-11-24 — End: ?

## 2022-11-24 MED ORDER — ELECTROLYTE-A IV SOLP
INTRAVENOUS | 0 refills | Status: AC
Start: 2022-11-24 — End: ?

## 2022-11-24 MED ORDER — ARTIFICIAL TEARS (PF) SINGLE DOSE DROPS GROUP
OPHTHALMIC | 0 refills | Status: AC
Start: 2022-11-24 — End: ?

## 2022-11-24 MED ORDER — SUGAMMADEX 100 MG/ML IV SOLN
INTRAVENOUS | 0 refills | Status: AC
Start: 2022-11-24 — End: ?

## 2022-11-24 MED ORDER — PHENYLEPHRINE 40 MCG/ML IN NS IV DRIP (STD CONC)
INTRAVENOUS | 0 refills | Status: AC
Start: 2022-11-24 — End: ?

## 2022-11-24 MED ORDER — LIDOCAINE (PF) 200 MG/10 ML (2 %) IJ SYRG
INTRAVENOUS | 0 refills | Status: AC
Start: 2022-11-24 — End: ?

## 2022-11-24 MED ORDER — DEXMEDETOMIDINE IN 0.9 % NACL 20 MCG/5 ML (4 MCG/ML) IV SYRG
INTRAVENOUS | 0 refills | Status: AC
Start: 2022-11-24 — End: ?

## 2022-11-24 MED ORDER — REMIFENTANYL 1000MCG IN NS 20ML (OR)
INTRAVENOUS | 0 refills | Status: DC
Start: 2022-11-24 — End: 2022-11-25

## 2022-11-24 MED ORDER — ALBUMIN, HUMAN 5 % 250 ML IV SOLP (AN)(OSM)
INTRAVENOUS | 0 refills | Status: AC
Start: 2022-11-24 — End: ?

## 2022-11-24 MED ORDER — DEXAMETHASONE SODIUM PHOSPHATE 4 MG/ML IJ SOLN
INTRAVENOUS | 0 refills | Status: AC
Start: 2022-11-24 — End: ?

## 2022-11-24 MED ORDER — PROPOFOL INJ 10 MG/ML IV VIAL
INTRAVENOUS | 0 refills | Status: AC
Start: 2022-11-24 — End: ?

## 2022-11-24 MED ORDER — FENTANYL CITRATE (PF) 50 MCG/ML IJ SOLN
INTRAVENOUS | 0 refills | Status: DC
Start: 2022-11-24 — End: 2022-11-25

## 2022-11-24 MED ORDER — PROPOFOL 10 MG/ML IV EMUL 100 ML (INFUSION)(AM)(OR)
INTRAVENOUS | 0 refills | Status: AC
Start: 2022-11-24 — End: ?

## 2022-11-24 MED ORDER — ONDANSETRON HCL (PF) 4 MG/2 ML IJ SOLN
INTRAVENOUS | 0 refills | Status: DC
Start: 2022-11-24 — End: 2022-11-25

## 2022-11-24 MED ORDER — PHENYLEPHRINE HCL IN 0.9% NACL 1 MG/10 ML (100 MCG/ML) IV SYRG
INTRAVENOUS | 0 refills | Status: AC
Start: 2022-11-24 — End: ?

## 2022-11-24 MED ORDER — ROCURONIUM 10 MG/ML IV SOLN GROUP
INTRAVENOUS | 0 refills | Status: AC
Start: 2022-11-24 — End: ?

## 2022-11-24 MED ADMIN — CEFAZOLIN INJ 1GM IVP [210319]: 2 g | INTRAVENOUS | @ 17:00:00 | Stop: 2022-11-24 | NDC 00143992490

## 2022-11-24 MED ADMIN — ACETAMINOPHEN 500 MG PO TAB [102]: 1000 mg | ORAL | @ 13:00:00 | Stop: 2022-11-24 | NDC 00904672080

## 2022-11-24 MED ADMIN — CEFAZOLIN INJ 1GM IVP [210319]: 2 g | INTRAVENOUS | @ 21:00:00 | Stop: 2022-11-24 | NDC 00143992490

## 2022-11-24 MED ADMIN — CELECOXIB 200 MG PO CAP [76958]: 200 mg | ORAL | @ 13:00:00 | Stop: 2022-11-24 | NDC 00904650361

## 2022-11-24 MED ADMIN — LACTATED RINGERS IV SOLP [4318]: 1000.000 mL | INTRAVENOUS | @ 21:00:00 | Stop: 2022-11-26 | NDC 00338011704

## 2022-11-24 MED ADMIN — CEFAZOLIN INJ 1GM IVP [210319]: 2 g | INTRAVENOUS | @ 14:00:00 | Stop: 2022-11-24 | NDC 00143992490

## 2022-11-24 MED ADMIN — HYDROMORPHONE (PF) 2 MG/ML IJ SYRG [163476]: 0.5 mg | INTRAVENOUS | @ 23:00:00 | Stop: 2022-11-25 | NDC 00409131203

## 2022-11-24 MED ADMIN — LACTATED RINGERS IV SOLP [4318]: 1000 mL | INTRAVENOUS | @ 13:00:00 | Stop: 2022-11-26 | NDC 00338011704

## 2022-11-25 ENCOUNTER — Inpatient Hospital Stay: Admit: 2022-11-25 | Discharge: 2022-11-25 | Payer: BC Managed Care – PPO

## 2022-11-25 MED ADMIN — CITALOPRAM 40 MG PO TAB [23490]: 40 mg | ORAL | @ 15:00:00 | NDC 00378623305

## 2022-11-25 MED ADMIN — CEFAZOLIN INJ 1GM IVP [210319]: 2 g | INTRAVENOUS | @ 12:00:00 | Stop: 2022-11-25 | NDC 60505614200

## 2022-11-25 MED ADMIN — HYDROMORPHONE (PF) 2 MG/ML IJ SYRG [163476]: 1 mg | INTRAVENOUS | @ 15:00:00 | Stop: 2022-11-27 | NDC 00409131203

## 2022-11-25 MED ADMIN — POLYETHYLENE GLYCOL 3350 17 GRAM PO PWPK [25424]: 17 g | ORAL | @ 15:00:00 | NDC 00904693186

## 2022-11-25 MED ADMIN — ACETAMINOPHEN 500 MG PO TAB [102]: 1000 mg | ORAL | @ 12:00:00 | Stop: 2022-11-27 | NDC 00904673061

## 2022-11-25 MED ADMIN — EZETIMIBE 10 MG PO TAB [86887]: 10 mg | ORAL | @ 15:00:00 | NDC 67877049090

## 2022-11-25 MED ADMIN — ACETAMINOPHEN 500 MG PO TAB [102]: 1000 mg | ORAL | @ 04:00:00 | Stop: 2022-11-27 | NDC 00904673061

## 2022-11-25 MED ADMIN — CELECOXIB 200 MG PO CAP [76958]: 200 mg | ORAL | @ 04:00:00 | NDC 00904650361

## 2022-11-25 MED ADMIN — GABAPENTIN 300 MG PO CAP [18308]: 300 mg | ORAL | @ 04:00:00 | NDC 67877022305

## 2022-11-25 MED ADMIN — OXYCODONE 10 MG PO TAB [166908]: 10 mg | ORAL | @ 18:00:00 | NDC 68084096811

## 2022-11-25 MED ADMIN — MAGNESIUM HYDROXIDE 400 MG/5 ML PO SUSP [79944]: 30 mL | ORAL | @ 15:00:00 | NDC 00121043130

## 2022-11-25 MED ADMIN — DOCUSATE SODIUM 100 MG PO CAP [2566]: 100 mg | ORAL | @ 04:00:00 | NDC 00904718361

## 2022-11-25 MED ADMIN — ACETAMINOPHEN 500 MG PO TAB [102]: 1000 mg | ORAL | @ 21:00:00 | Stop: 2022-11-27 | NDC 00904673061

## 2022-11-25 MED ADMIN — ROSUVASTATIN 5 MG PO TAB [88502]: 20 mg | ORAL | @ 15:00:00 | NDC 68462026190

## 2022-11-25 MED ADMIN — DEXAMETHASONE SODIUM PHOS (PF) 10 MG/ML IJ SOLN [136313]: 4 mg | INTRAVENOUS | @ 04:00:00 | Stop: 2022-11-25 | NDC 55150030401

## 2022-11-25 MED ADMIN — OXYCODONE 10 MG PO TAB [166908]: 10 mg | ORAL | @ 23:00:00 | NDC 68084096811

## 2022-11-25 MED ADMIN — DOCUSATE SODIUM 100 MG PO CAP [2566]: 100 mg | ORAL | @ 15:00:00 | NDC 00904718361

## 2022-11-25 MED ADMIN — DEXAMETHASONE SODIUM PHOS (PF) 10 MG/ML IJ SOLN [136313]: 4 mg | INTRAVENOUS | @ 12:00:00 | Stop: 2022-11-25 | NDC 55150030401

## 2022-11-25 MED ADMIN — CEFAZOLIN INJ 1GM IVP [210319]: 2 g | INTRAVENOUS | @ 04:00:00 | Stop: 2022-11-25 | NDC 60505614200

## 2022-11-25 MED ADMIN — LOSARTAN 25 MG PO TAB [80886]: 25 mg | ORAL | @ 15:00:00 | NDC 00904704761

## 2022-11-25 MED ADMIN — DEXAMETHASONE SODIUM PHOS (PF) 10 MG/ML IJ SOLN [136313]: 4 mg | INTRAVENOUS | @ 06:00:00 | Stop: 2022-11-25 | NDC 55150030401

## 2022-11-25 MED ADMIN — HYDROMORPHONE PCA 10 MG/50 ML SYR (STD CONC)(ADULT)(PREMADE) [212382]: 50.000 mL | INTRAVENOUS | NDC 70004030022

## 2022-11-25 MED ADMIN — DEXAMETHASONE SODIUM PHOS (PF) 10 MG/ML IJ SOLN [136313]: 4 mg | INTRAVENOUS | @ 18:00:00 | Stop: 2022-11-25 | NDC 55150030401

## 2022-11-25 MED ADMIN — CELECOXIB 200 MG PO CAP [76958]: 200 mg | ORAL | @ 15:00:00 | NDC 00904650361

## 2022-11-25 MED ADMIN — SODIUM CHLORIDE 0.9 % IV SOLP [27838]: 1000.000 mL | INTRAVENOUS | Stop: 2022-11-26 | NDC 00338004904

## 2022-11-25 MED ADMIN — OXYCODONE 10 MG PO TAB [166908]: 10 mg | ORAL | @ 12:00:00 | NDC 68084096811

## 2022-11-26 ENCOUNTER — Encounter: Admit: 2022-11-26 | Discharge: 2022-11-26 | Payer: BC Managed Care – PPO

## 2022-11-26 DIAGNOSIS — M5136 Other intervertebral disc degeneration, lumbar region: Secondary | ICD-10-CM

## 2022-11-26 DIAGNOSIS — K219 Gastro-esophageal reflux disease without esophagitis: Secondary | ICD-10-CM

## 2022-11-26 DIAGNOSIS — M4800 Spinal stenosis, site unspecified: Secondary | ICD-10-CM

## 2022-11-26 DIAGNOSIS — F32A Depression: Secondary | ICD-10-CM

## 2022-11-26 DIAGNOSIS — H269 Unspecified cataract: Secondary | ICD-10-CM

## 2022-11-26 DIAGNOSIS — IMO0002 Ulcer: Secondary | ICD-10-CM

## 2022-11-26 DIAGNOSIS — R9430 Abnormal result of cardiovascular function study, unspecified: Secondary | ICD-10-CM

## 2022-11-26 DIAGNOSIS — M2560 Stiffness of unspecified joint, not elsewhere classified: Secondary | ICD-10-CM

## 2022-11-26 DIAGNOSIS — E785 Hyperlipidemia, unspecified: Secondary | ICD-10-CM

## 2022-11-26 DIAGNOSIS — M2550 Pain in unspecified joint: Secondary | ICD-10-CM

## 2022-11-26 DIAGNOSIS — H9190 Unspecified hearing loss, unspecified ear: Secondary | ICD-10-CM

## 2022-11-26 DIAGNOSIS — R519 Generalized headaches: Secondary | ICD-10-CM

## 2022-11-26 DIAGNOSIS — I10 Essential (primary) hypertension: Secondary | ICD-10-CM

## 2022-11-26 DIAGNOSIS — M1990 Unspecified osteoarthritis, unspecified site: Secondary | ICD-10-CM

## 2022-11-26 DIAGNOSIS — M75120 Complete rotator cuff tear or rupture of unspecified shoulder, not specified as traumatic: Secondary | ICD-10-CM

## 2022-11-26 DIAGNOSIS — I2510 Atherosclerotic heart disease of native coronary artery without angina pectoris: Secondary | ICD-10-CM

## 2022-11-26 DIAGNOSIS — G4730 Sleep apnea, unspecified: Secondary | ICD-10-CM

## 2022-11-26 DIAGNOSIS — S42121A Displaced fracture of acromial process, right shoulder, initial encounter for closed fracture: Secondary | ICD-10-CM

## 2022-11-26 MED ADMIN — EZETIMIBE 10 MG PO TAB [86887]: 10 mg | ORAL | @ 15:00:00 | Stop: 2022-11-27 | NDC 67877049090

## 2022-11-26 MED ADMIN — DOCUSATE SODIUM 100 MG PO CAP [2566]: 100 mg | ORAL | @ 02:00:00 | NDC 00904718361

## 2022-11-26 MED ADMIN — CYCLOBENZAPRINE 5 MG PO TAB [35184]: 5 mg | ORAL | @ 02:00:00 | NDC 68084075395

## 2022-11-26 MED ADMIN — MAGNESIUM HYDROXIDE 400 MG/5 ML PO SUSP [79944]: 30 mL | ORAL | @ 15:00:00 | Stop: 2022-11-27 | NDC 00121043130

## 2022-11-26 MED ADMIN — ACETAMINOPHEN 500 MG PO TAB [102]: 1000 mg | ORAL | @ 06:00:00 | Stop: 2022-11-26 | NDC 00904673061

## 2022-11-26 MED ADMIN — OXYCODONE 5 MG PO TAB [10814]: 5 mg | ORAL | @ 15:00:00 | Stop: 2022-11-27 | NDC 68084035411

## 2022-11-26 MED ADMIN — ROSUVASTATIN 5 MG PO TAB [88502]: 20 mg | ORAL | @ 15:00:00 | Stop: 2022-11-27 | NDC 68462026190

## 2022-11-26 MED ADMIN — CITALOPRAM 40 MG PO TAB [23490]: 40 mg | ORAL | @ 15:00:00 | Stop: 2022-11-27 | NDC 00378623305

## 2022-11-26 MED ADMIN — CELECOXIB 200 MG PO CAP [76958]: 200 mg | ORAL | @ 02:00:00 | NDC 00904650361

## 2022-11-26 MED ADMIN — CELECOXIB 200 MG PO CAP [76958]: 200 mg | ORAL | @ 15:00:00 | Stop: 2022-11-27 | NDC 00904650361

## 2022-11-26 MED ADMIN — ACETAMINOPHEN 500 MG PO TAB [102]: 1000 mg | ORAL | @ 15:00:00 | Stop: 2022-11-26 | NDC 00904673061

## 2022-11-26 MED ADMIN — POLYETHYLENE GLYCOL 3350 17 GRAM PO PWPK [25424]: 17 g | ORAL | @ 15:00:00 | Stop: 2022-11-27 | NDC 00904693186

## 2022-11-26 MED ADMIN — OXYCODONE 5 MG PO TAB [10814]: 10 mg | ORAL | @ 06:00:00 | Stop: 2022-11-27 | NDC 00406055223

## 2022-11-26 MED ADMIN — POLYETHYLENE GLYCOL 3350 17 GRAM PO PWPK [25424]: 17 g | ORAL | @ 02:00:00 | NDC 00904693186

## 2022-11-26 MED ADMIN — ACETAMINOPHEN 500 MG PO TAB [102]: 1000 mg | ORAL | @ 20:00:00 | Stop: 2022-11-26 | NDC 00904673061

## 2022-11-26 MED ADMIN — LOSARTAN 25 MG PO TAB [80886]: 25 mg | ORAL | @ 15:00:00 | Stop: 2022-11-27 | NDC 00904704761

## 2022-11-26 MED ADMIN — GABAPENTIN 300 MG PO CAP [18308]: 300 mg | ORAL | @ 02:00:00 | NDC 67877022305

## 2022-11-26 MED ADMIN — OXYCODONE 5 MG PO TAB [10814]: 10 mg | ORAL | @ 02:00:00 | NDC 00406055223

## 2022-11-26 MED ADMIN — DOCUSATE SODIUM 100 MG PO CAP [2566]: 100 mg | ORAL | @ 15:00:00 | Stop: 2022-11-27 | NDC 00904718361

## 2022-11-26 MED FILL — OXYCODONE 5 MG PO TAB: 5 mg | ORAL | 6 days supply | Qty: 70 | Fill #1 | Status: CP

## 2022-11-27 ENCOUNTER — Encounter: Admit: 2022-11-27 | Discharge: 2022-11-27 | Payer: BC Managed Care – PPO

## 2022-11-27 NOTE — Telephone Encounter
Hospital Discharge Follow Up      Reached Patient: Yes, patient was identified, for their safety, using dual identification of name and date of birth  Patient Date of Birth: 10/30/1951     Admission Information:     Hospital Name: [facility]  Admission Date: 11/24/2022    Discharge Date: 11/26/2022    Admission Diagnosis:  Spondylolisthesis of lumbar region, Lumbar stenosis with neurogenic claudication, Spondylolisthesis at L4-L5 level  Discharge Diagnosis: Lumbar spondylolisthesis, Lumbar degenerative scoliosis, lumbar claudicant stenosis, lumbar foraminal stenosis, lumbar radiculopathy, lumbar degenerative disk disease, L2 spondylolysis  Surgery: 11/24/2022 Anterior/posterior lumbar two-lumbar five decompression and fusion with infuse and allograft  Has there been a discharge within the last 30 days? No N/A  Hospital Services: Planned  Today's call is 1 (business) days post discharge      Discharge Instruction Review   Did patient receive and understand discharge instructions? Yes    Home Health ordered? No  Caregiver assistance in the home? No   Are there concerns regarding the patient's ADL'S?  Patient is utilizing a roller walker  Is patient a fall risk? Yes    Special diet? No regular diet      Medication Reconciliation    Changes to pre-hospital medications? Yes  CHANGE how you take:  meloxicam [facility])  Were new prescriptions filled? Yes  START taking:  docusate (COLACE)  oxycodone (ROXICODONE)  polyethylene glycol 3350 (MIRALAX)  Meds reviewed and reconciled? Yes    Current Outpatient Medications   Medication Instructions    ascorbic acid (vitamin C) (C-500) 500 mg, Oral, DAILY    cetirizine (ZYRTEC) 10 mg, Oral, EVERY MORNING    citalopram (CELEXA) 40 mg, Oral, DAILY    cyclobenzaprine (FLEXERIL) 5 mg tablet 1-2 tablets, Oral, DAILY  PRN    docusate (COLACE) 100 mg, Oral, TWICE DAILY    ezetimibe (ZETIA) 10 mg tablet TAKE 1 TABLET BY MOUTH EVERY DAY    fluticasone propionate (FLONASE) 50 mcg/actuation nasal spray, suspension 1 spray, Each Nostril, AT BEDTIME DAILY, Shake bottle gently before using.     folic acid (FOLVITE) 1 mg tablet TAKE 1 TABLET BY MOUTH EVERY DAY    gabapentin (NEURONTIN) 300 mg capsule TAKE 1 CAPSULE BY MOUTH EVERY 8 HOURS    GLUCOSAMINE SULFATE PO 1,500 mg, Oral, EVERY MORNING    losartan (COZAAR) 25 mg, Oral, DAILY    [START ON 01/26/2023] meloxicam (MOBIC) 15 mg, Oral, EVERY MORNING    omeprazole DR (PRILOSEC) 40 mg, Oral, DAILY BEFORE BREAKFAST    oxyCODONE (ROXICODONE) 5-10 mg, Oral, EVERY  4 HOURS PRN    polyethylene glycol 3350 (MIRALAX) 17 g, Oral, TWICE DAILY    rosuvastatin (CRESTOR) 20 mg tablet TAKE 1 TABLET BY MOUTH EVERY DAY    zolpidem (AMBIEN) 5 mg, Oral, AT BEDTIME PRN         Understanding Condition   Having any current symptoms? Yes, Patient states he is controlling his pain with 1 oxycodone and 2 extra strength tylenol every 4 hours and denies any radiating pain.  Patient states he still has a little bit of pain to his groin area as well.  Patient denies constipation, dysuria, fever/chills, nausea/vomiting.    Do you have a history of Heart Failure? No    Patient understands when to seek additional medical care? Yes   Other items discussed: No driving while taking narcotics.  No  strenuous activity until after follow up appointment, no lifting more than 5 pounds until  after follow up appointment, may shower 5 days after surgery but don't allow water to hit wounds directly     Scheduling Follow-up Appointment   Upcoming appointments:   Future Appointments   Date Time Provider Department Center   12/26/2022 11:15 AM Criss Rosales, MD [facility]   01/27/2023 10:20 AM Al-Hihi, Roderic Scarce, MD [facility]   03/06/2023  9:30 AM APP B[facility]   03/20/2023  8:00 AM Roberts Gaudy A, PA-C [facility] Exam     Does the patient require 7 day follow up appointment? No  Hospital Follow-Up scheduled with PCP? No, patient declined appointment patient does have appointment with pcp on 01/27/2023  When was patient?s last PCP visit: 05/27/2022   PCP primary location: [facility]  Specialist appointment scheduled? Yes, with spine  12/26/2022 and 03/06/2023; Cardiology 03/20/2023  Is assistance with transportation needed? No   MyChart message sent? Active in MyChart. No message sent.   Artera text sent? No    Yancey Flemings, RN    Note to patient: The 21st Century Cures Act makes medical notes like these available to patients in the interest of transparency. However, be advised this is a medical document. It is intended as peer to peer communication. It is written in medical language and may contain abbreviations or words that are unfamiliar. It may appear direct. Medical documents are intended to carry relevant information, facts as evident, and the clinical opinion of the clinician.

## 2022-11-28 ENCOUNTER — Encounter: Admit: 2022-11-28 | Discharge: 2022-11-28 | Payer: BC Managed Care – PPO

## 2022-12-01 ENCOUNTER — Encounter: Admit: 2022-12-01 | Discharge: 2022-12-01 | Payer: BC Managed Care – PPO

## 2022-12-01 DIAGNOSIS — Z0000 Encounter for general adult medical examination without abnormal findings: Secondary | ICD-10-CM

## 2022-12-01 DIAGNOSIS — E782 Mixed hyperlipidemia: Secondary | ICD-10-CM

## 2022-12-01 DIAGNOSIS — K21 Gastroesophageal reflux disease with esophagitis without hemorrhage: Secondary | ICD-10-CM

## 2022-12-01 DIAGNOSIS — F5101 Primary insomnia: Secondary | ICD-10-CM

## 2022-12-01 DIAGNOSIS — I10 Essential (primary) hypertension: Secondary | ICD-10-CM

## 2022-12-01 DIAGNOSIS — F419 Anxiety disorder, unspecified: Secondary | ICD-10-CM

## 2022-12-01 MED ORDER — ZOLPIDEM 5 MG PO TAB
5 mg | ORAL_TABLET | Freq: Every evening | ORAL | 0 refills | PRN
Start: 2022-12-01 — End: ?

## 2022-12-02 ENCOUNTER — Encounter: Admit: 2022-12-02 | Discharge: 2022-12-02 | Payer: BC Managed Care – PPO

## 2022-12-02 DIAGNOSIS — F419 Anxiety disorder, unspecified: Secondary | ICD-10-CM

## 2022-12-02 DIAGNOSIS — K21 Gastroesophageal reflux disease with esophagitis without hemorrhage: Secondary | ICD-10-CM

## 2022-12-02 DIAGNOSIS — Z0000 Encounter for general adult medical examination without abnormal findings: Secondary | ICD-10-CM

## 2022-12-02 DIAGNOSIS — E782 Mixed hyperlipidemia: Secondary | ICD-10-CM

## 2022-12-02 DIAGNOSIS — I10 Essential (primary) hypertension: Secondary | ICD-10-CM

## 2022-12-02 DIAGNOSIS — F5101 Primary insomnia: Secondary | ICD-10-CM

## 2022-12-02 MED ORDER — LOSARTAN 25 MG PO TAB
25 mg | ORAL_TABLET | Freq: Every day | ORAL | 3 refills | 90.00000 days | Status: AC
Start: 2022-12-02 — End: ?

## 2022-12-02 MED ORDER — HYDROCODONE-ACETAMINOPHEN 7.5-325 MG PO TAB
1 | ORAL_TABLET | ORAL | 0 refills | 30.00000 days | Status: AC | PRN
Start: 2022-12-02 — End: ?

## 2022-12-02 NOTE — Telephone Encounter
Requested Prescriptions   Pending Prescriptions Disp Refills    losartan (COZAAR) 25 mg tablet [Pharmacy Med Name: LOSARTAN POTASSIUM 25 MG TAB] 90 tablet 3     Sig: TAKE 1 TABLET BY MOUTH DAILY       Cardiovascular: ACEIs, ARBs & Renin Inhibitors Failed - 12/02/2022  9:54 AM        Failed - Valid encounter within last 6 months     Recent Visits  Date Type Provider Dept   05/27/22 Office Visit Al-Hihi, Roderic Scarce, MD [facility]   01/20/22 Office Visit Al-Hihi, Roderic Scarce, MD [facility]   09/16/21 Office Visit Al-Hihi, Roderic Scarce, MD [facility]   02/18/21 Office Visit Al-Hihi, Roderic Scarce, MD [facility]   Showing recent visits within past 720 days and meeting all other requirements  Future Appointments  Date Type Provider Dept   01/27/23 Appointment Al-Hihi, Roderic Scarce, MD [facility]   Showing future appointments within next 360 days and meeting all other requirements            Passed - BP completed in the last 12 months     BP Readings from Last 2 Encounters:   11/26/22 122/61   11/05/22 (!) 144/87             Passed - Cr in normal range and within 360 days     Creatinine   Date Value Ref Range Status   11/26/2022 0.66 0.4 - 1.24 MG/DL Final   67/89/3810 1.75 0.4 - 1.24 MG/DL Final              Passed - K in normal range and within 360 days     Potassium   Date Value Ref Range Status   11/26/2022 4.4 3.5 - 5.1 MMOL/L Final   11/25/2022 3.9 3.5 - 5.1 MMOL/L Final                        Jared Hebert

## 2022-12-12 ENCOUNTER — Encounter: Admit: 2022-12-12 | Discharge: 2022-12-12 | Payer: BC Managed Care – PPO

## 2022-12-12 MED ORDER — HYDROCODONE-ACETAMINOPHEN 7.5-325 MG PO TAB
1 | ORAL_TABLET | ORAL | 0 refills | PRN
Start: 2022-12-12 — End: ?

## 2022-12-29 ENCOUNTER — Encounter: Admit: 2022-12-29 | Discharge: 2022-12-29 | Payer: BC Managed Care – PPO

## 2022-12-29 DIAGNOSIS — M549 Dorsalgia, unspecified: Secondary | ICD-10-CM

## 2022-12-29 NOTE — Patient Instructions
It was a pleasure seeing you in clinic today.  You were seen today for your first post op visit following your surgery     If  your sutures were removed today. Please leave the dressing on for 24 hours. After 24 hours you may remove the dressing and shower. Please do not submerge your incision in water for another 3 weeks.    Please call [phone] if you need a sooner appointment. We will plan to have you follow up 2 months from today for your 3 month post op visit.        Amiel Mccaffrey RN, CNC  Dr. Joshua T. Bunch/Ortho Spine Surgery  [facility]  [facility]  [address]  [place], [place] [address]  Phone: [phone]   Fax [phone]  Scheduling [phone]  [email or web address]    We appreciate the interest in your health.  The physician reviews both the radiology reports and the imaging independently and will contact you if there are any emergent findings.  If you would like, incidental and chronic changes seen in your images can be reviewed with Dr. Bunch at a follow up appointment.  At that appointment, the radiology report and terminology used in that report can also be explained. Please call our Schedule line if you would like to follow up.

## 2022-12-31 ENCOUNTER — Ambulatory Visit: Admit: 2022-12-31 | Discharge: 2022-12-31 | Payer: BC Managed Care – PPO

## 2022-12-31 ENCOUNTER — Encounter: Admit: 2022-12-31 | Discharge: 2022-12-31 | Payer: BC Managed Care – PPO

## 2022-12-31 DIAGNOSIS — R519 Generalized headaches: Secondary | ICD-10-CM

## 2022-12-31 DIAGNOSIS — S42121A Displaced fracture of acromial process, right shoulder, initial encounter for closed fracture: Secondary | ICD-10-CM

## 2022-12-31 DIAGNOSIS — I2510 Atherosclerotic heart disease of native coronary artery without angina pectoris: Secondary | ICD-10-CM

## 2022-12-31 DIAGNOSIS — R9430 Abnormal result of cardiovascular function study, unspecified: Secondary | ICD-10-CM

## 2022-12-31 DIAGNOSIS — M1990 Unspecified osteoarthritis, unspecified site: Secondary | ICD-10-CM

## 2022-12-31 DIAGNOSIS — I10 Essential (primary) hypertension: Secondary | ICD-10-CM

## 2022-12-31 DIAGNOSIS — M2560 Stiffness of unspecified joint, not elsewhere classified: Secondary | ICD-10-CM

## 2022-12-31 DIAGNOSIS — M75120 Complete rotator cuff tear or rupture of unspecified shoulder, not specified as traumatic: Secondary | ICD-10-CM

## 2022-12-31 DIAGNOSIS — IMO0002 Ulcer: Secondary | ICD-10-CM

## 2022-12-31 DIAGNOSIS — H269 Unspecified cataract: Secondary | ICD-10-CM

## 2022-12-31 DIAGNOSIS — K219 Gastro-esophageal reflux disease without esophagitis: Secondary | ICD-10-CM

## 2022-12-31 DIAGNOSIS — M4800 Spinal stenosis, site unspecified: Secondary | ICD-10-CM

## 2022-12-31 DIAGNOSIS — M2550 Pain in unspecified joint: Secondary | ICD-10-CM

## 2022-12-31 DIAGNOSIS — Z981 Arthrodesis status: Secondary | ICD-10-CM

## 2022-12-31 DIAGNOSIS — M549 Dorsalgia, unspecified: Secondary | ICD-10-CM

## 2022-12-31 DIAGNOSIS — M5136 Other intervertebral disc degeneration, lumbar region: Secondary | ICD-10-CM

## 2022-12-31 DIAGNOSIS — G4730 Sleep apnea, unspecified: Secondary | ICD-10-CM

## 2022-12-31 DIAGNOSIS — F32A Depression: Secondary | ICD-10-CM

## 2022-12-31 DIAGNOSIS — E785 Hyperlipidemia, unspecified: Secondary | ICD-10-CM

## 2022-12-31 DIAGNOSIS — H9190 Unspecified hearing loss, unspecified ear: Secondary | ICD-10-CM

## 2023-01-03 ENCOUNTER — Encounter: Admit: 2023-01-03 | Discharge: 2023-01-03 | Payer: BC Managed Care – PPO

## 2023-01-03 MED ORDER — EZETIMIBE 10 MG PO TAB
ORAL_TABLET | 1 refills
Start: 2023-01-03 — End: ?

## 2023-01-07 ENCOUNTER — Encounter: Admit: 2023-01-07 | Discharge: 2023-01-07 | Payer: BC Managed Care – PPO

## 2023-01-07 MED ORDER — CEPHALEXIN 500 MG PO CAP
1000 mg | ORAL_CAPSULE | Freq: Four times a day (QID) | ORAL | 0 refills | Status: AC
Start: 2023-01-07 — End: ?

## 2023-01-21 ENCOUNTER — Encounter: Admit: 2023-01-21 | Discharge: 2023-01-21 | Payer: BC Managed Care – PPO

## 2023-01-21 DIAGNOSIS — M1990 Unspecified osteoarthritis, unspecified site: Secondary | ICD-10-CM

## 2023-01-21 DIAGNOSIS — S42121A Displaced fracture of acromial process, right shoulder, initial encounter for closed fracture: Secondary | ICD-10-CM

## 2023-01-21 DIAGNOSIS — M5136 Other intervertebral disc degeneration, lumbar region: Secondary | ICD-10-CM

## 2023-01-21 DIAGNOSIS — I10 Essential (primary) hypertension: Secondary | ICD-10-CM

## 2023-01-21 DIAGNOSIS — F32A Depression: Secondary | ICD-10-CM

## 2023-01-21 DIAGNOSIS — R519 Generalized headaches: Secondary | ICD-10-CM

## 2023-01-21 DIAGNOSIS — K219 Gastro-esophageal reflux disease without esophagitis: Secondary | ICD-10-CM

## 2023-01-21 DIAGNOSIS — I2510 Atherosclerotic heart disease of native coronary artery without angina pectoris: Secondary | ICD-10-CM

## 2023-01-21 DIAGNOSIS — M75120 Complete rotator cuff tear or rupture of unspecified shoulder, not specified as traumatic: Secondary | ICD-10-CM

## 2023-01-21 DIAGNOSIS — M2550 Pain in unspecified joint: Secondary | ICD-10-CM

## 2023-01-21 DIAGNOSIS — R9430 Abnormal result of cardiovascular function study, unspecified: Secondary | ICD-10-CM

## 2023-01-21 DIAGNOSIS — D649 Anemia, unspecified: Secondary | ICD-10-CM

## 2023-01-21 DIAGNOSIS — M4800 Spinal stenosis, site unspecified: Secondary | ICD-10-CM

## 2023-01-21 DIAGNOSIS — M2560 Stiffness of unspecified joint, not elsewhere classified: Secondary | ICD-10-CM

## 2023-01-21 DIAGNOSIS — H9190 Unspecified hearing loss, unspecified ear: Secondary | ICD-10-CM

## 2023-01-21 DIAGNOSIS — E785 Hyperlipidemia, unspecified: Secondary | ICD-10-CM

## 2023-01-21 DIAGNOSIS — H269 Unspecified cataract: Secondary | ICD-10-CM

## 2023-01-21 DIAGNOSIS — IMO0002 Ulcer: Secondary | ICD-10-CM

## 2023-01-21 DIAGNOSIS — G4730 Sleep apnea, unspecified: Secondary | ICD-10-CM

## 2023-01-21 NOTE — Progress Notes
Pt hospitalized since last office visit: No    Health Maintenance Due   Topic Date Due    HEPATITIS C SCREENING  Never done    SHINGLES RECOMBINANT VACCINE (1 of 2) Never done    RSV VACCINE (60 YEARS AND OLDER) (1 - 1-dose 60+ series) Never done    INFLUENZA VACCINE (1) 07/22/2022    COVID-19 VACCINE (5 - 2023-24 season) 08/22/2022    PHYSICAL (COMPREHENSIVE) EXAM  09/16/2022       There are no diagnoses linked to this encounter.    Labs not done:      Lab Frequency Next Occurrence   REQUEST FOR CARDIOLOGY APPOINTMENT Once 09/16/2022   REQUEST FOR CARDIOLOGY APPOINTMENT Once 07/10/2022   CBC AND DIFF Once 62/95/2841   BASIC METABOLIC PANEL Once 32/44/0102   HEMOGLOBIN A1C Once 04/20/2022   LIVER FUNCTION PANEL Once 04/20/2022   MICROALB/CR RATIO-URINE RANDOM Once 04/20/2022   TSH WITH FREE T4 REFLEX Once 04/20/2022   LIPID PROFILE Once 09/26/2022   URINALYSIS DIPSTICK REFLEX TO CULTURE Once 08/26/2022   URINALYSIS MICROSCOPIC REFLEX TO CULTURE Once 08/26/2022   UA GREY TOP TUBE Once 08/26/2022   CLEAR TOP EXTRA URINE TUBE Once 08/26/2022   URINALYSIS DIPSTICK Once 10/22/2022   URINALYSIS, MICROSCOPIC Once 10/22/2022   REQUEST FOR CARDIOLOGY APPOINTMENT Once 03/16/2023   REQUEST FOR CARDIOLOGY APPOINTMENT Once 09/16/2023     Labs drawn by different provider in November.     Notes to provider:    Answers submitted by the patient for this visit:  Other Symptom Questionnaire (Submitted on 01/20/2023)  Chief Complaint: Patient Reported Other  What topic(s) would you like to cover during your appointment?: Semi-Annual Check Up  Please describe the issue(s) and history with the issue (location, severity, duration, symptoms, etc.). : None  What has been done so far to take care of the issue(s)?: -  What are your goals for this visit?: -

## 2023-01-27 ENCOUNTER — Ambulatory Visit: Admit: 2023-01-27 | Discharge: 2023-01-28 | Payer: BC Managed Care – PPO

## 2023-01-27 ENCOUNTER — Encounter: Admit: 2023-01-27 | Discharge: 2023-01-27 | Payer: BC Managed Care – PPO

## 2023-01-27 DIAGNOSIS — F32A Depression: Secondary | ICD-10-CM

## 2023-01-27 DIAGNOSIS — M4800 Spinal stenosis, site unspecified: Secondary | ICD-10-CM

## 2023-01-27 DIAGNOSIS — R519 Generalized headaches: Secondary | ICD-10-CM

## 2023-01-27 DIAGNOSIS — M5136 Other intervertebral disc degeneration, lumbar region: Secondary | ICD-10-CM

## 2023-01-27 DIAGNOSIS — IMO0002 Ulcer: Secondary | ICD-10-CM

## 2023-01-27 DIAGNOSIS — D649 Anemia, unspecified: Secondary | ICD-10-CM

## 2023-01-27 DIAGNOSIS — M2550 Pain in unspecified joint: Secondary | ICD-10-CM

## 2023-01-27 DIAGNOSIS — I10 Essential (primary) hypertension: Secondary | ICD-10-CM

## 2023-01-27 DIAGNOSIS — I25118 Atherosclerotic heart disease of native coronary artery with other forms of angina pectoris: Secondary | ICD-10-CM

## 2023-01-27 DIAGNOSIS — G4730 Sleep apnea, unspecified: Secondary | ICD-10-CM

## 2023-01-27 DIAGNOSIS — R9430 Abnormal result of cardiovascular function study, unspecified: Secondary | ICD-10-CM

## 2023-01-27 DIAGNOSIS — M1990 Unspecified osteoarthritis, unspecified site: Secondary | ICD-10-CM

## 2023-01-27 DIAGNOSIS — H9190 Unspecified hearing loss, unspecified ear: Secondary | ICD-10-CM

## 2023-01-27 DIAGNOSIS — H269 Unspecified cataract: Secondary | ICD-10-CM

## 2023-01-27 DIAGNOSIS — E785 Hyperlipidemia, unspecified: Secondary | ICD-10-CM

## 2023-01-27 DIAGNOSIS — K219 Gastro-esophageal reflux disease without esophagitis: Secondary | ICD-10-CM

## 2023-01-27 DIAGNOSIS — M75120 Complete rotator cuff tear or rupture of unspecified shoulder, not specified as traumatic: Secondary | ICD-10-CM

## 2023-01-27 DIAGNOSIS — F5101 Primary insomnia: Secondary | ICD-10-CM

## 2023-01-27 DIAGNOSIS — M2560 Stiffness of unspecified joint, not elsewhere classified: Secondary | ICD-10-CM

## 2023-01-27 DIAGNOSIS — S42121A Displaced fracture of acromial process, right shoulder, initial encounter for closed fracture: Secondary | ICD-10-CM

## 2023-01-27 DIAGNOSIS — Z0000 Encounter for general adult medical examination without abnormal findings: Secondary | ICD-10-CM

## 2023-01-27 DIAGNOSIS — I2510 Atherosclerotic heart disease of native coronary artery without angina pectoris: Secondary | ICD-10-CM

## 2023-01-27 MED ORDER — CITALOPRAM 40 MG PO TAB
40 mg | ORAL_TABLET | Freq: Every day | ORAL | 3 refills | Status: AC
Start: 2023-01-27 — End: ?

## 2023-01-27 MED ORDER — ROSUVASTATIN 20 MG PO TAB
20 mg | ORAL_TABLET | Freq: Every day | ORAL | 3 refills | 90.00000 days | Status: AC
Start: 2023-01-27 — End: ?

## 2023-01-27 NOTE — Patient Instructions
Get fasting labs few days before return to clinic in 6 months

## 2023-01-27 NOTE — Progress Notes
Answers submitted by the patient for this visit:  Other Symptom Questionnaire (Submitted on 01/20/2023)  Chief Complaint: Patient Reported Other  What topic(s) would you like to cover during your appointment?: Semi-Annual Check Up  Please describe the issue(s) and history with the issue (location, severity, duration, symptoms, etc.). : None  What has been done so far to take care of the issue(s)?: -  What are your goals for this visit?: -    Date of Service: 01/27/2023    Jared Hebert is a 72 y.o. male. DOB: 1950-12-31   MRN#: [id number]    Subjective: follow up       Hypertension  This is a chronic problem. The problem is controlled. Pertinent negatives include no anxiety, blurred vision, chest pain, headaches, malaise/fatigue, neck pain, orthopnea, palpitations or shortness of breath.   Back Pain  This is a chronic problem. The current episode started more than 1 month ago. The problem occurs intermittently. The pain is at a severity of 4/10. The pain is moderate. The pain is Worse during the day. The symptoms are aggravated by bending and standing. Pertinent negatives include no abdominal pain, chest pain, dysuria, fever, headaches, numbness, weakness or weight loss. He has tried chiropractic manipulation and NSAIDs for the symptoms. The treatment provided no relief.   Patient Reported Other  What topic(s) would you like to cover during your appointment?:  Semi-Annual Check Up  Please describe the issue(s) and history with the issue (location, severity, duration, symptoms, etc.).:  None  What has been done so far to take care of the issue(s)?:  -  What are your goals for this visit?:  -    Jared Hebert is 72 y.o. patient who presents to clinic for follow up. He established care 07/19/2019. He is a retired Engineer, civil (consulting) in [employer]. No smoking, no alcohol, no street drugs. No regular exercise. He has 4 children.     He was last seen in our clinic 05/2022.    He follows with spine center and saw them 04/11/22. He had a back injection 04/24/22  He had back surgery 11/24/22:   Anterior /Posterior Lumbar two -lumbar Five decompression and fusion with infuse and allograft. (Left)   He had an ortho follow up 12/31/22    He had Rt should er surgery 11/2020, 04/2021 and again in 04/2021 at [facility].     He saw sleep clinic 05/2021    He saw Cardiology 09/16/21 and 11/2021:  He had inconclusive coronary CT study and stress test so he ended with cardiac cath a/23/23 which showed no critical stenosis.     He had left arm and left chest wall shingles in 10/2021.     He had upset stomach recently similar to when he GI bleed before. No blood in stool. He had a bleeding ulcer before so I advised him to talk to his outside GI doctor to get another EGD. His Hb dropped from 13.7 on 09/16/21 to 12.6 on 01/10/22   He had a normal repeat EGD 02/28/22 ( copied sent for scanning)    He was in clinic for low back pain for 6 weeks as above. 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   He was referred by cardiology to GI because of anemia. He did EGD and colonscopy 01/25/2020 and was found to have a non-bleeding gastric ulcer 15mm that was biopsied and is scheduled for repeat EGD in 2 months. Colonscopy  was normal. Results of scopes are sent for scanning. I told him to stop all  NSAID and Mobic at this time. He had a repeat EGD 05/07/2020 by his outside GI doctor and was reported that gastric ulcer has healed.      His calcium score was 315. He saw cardiology 12/2019. He had CTA chest which was normal but There was incidental cholelithiasis and a right renal lesion that will require ultrasound for further definition.     He had a normal stress test 11/09/2020  He failed oral iron so cardiology ordered IV Iron.     He had labs drawn 11/26/22    He had abdomen US 01/30/2020 which showed:  Small hypoechoic exophytic right renal nodule most compatible with a   hemorrhagic cyst   Cholelithiasis   Mild bladder wall thickening, nonspecific but most likely secondary to   bladder outlet stenosis or neurogenic bladder     He managed to lose intentionally 55 Lb over last 1 year by having ketogenic diet. He takes multivitamin daily.    He also struggles with depression but no intent to harm himself or others. His brother had depression and committed suicide in 2003.       Medical History:   Diagnosis Date    Abnormal cardiac function test 01/13/2022    Anemia, unspecified 01/21/2023    Arthritis     back & shoulders    Cataract 5 Years ago    Coronary artery disease     Degenerative disc disease, lumbar     Depression 20+ Yrs    Displaced fracture of acromial process, right shoulder, initial encounter for closed fracture 11/26/2022    Generalized headaches Most of adult life    GERD (gastroesophageal reflux disease)     Hearing loss     Hyperlipidemia     Hypertension     Joint pain 10 yrs ago    Joint stiffness 2009    Nontraumatic complete tear of rotator cuff 11/26/2022    Sleep apnea     Spinal stenosis     Ulcer 12/2019    gastric       Surgical History:   Procedure Laterality Date    VASECTOMY  1984    HX CATARACT REMOVAL Bilateral 02/2017    OS Toric    PARS PLANA MECHANICAL VITRECTOMY WITH  Kenolog Right 09/02/2018    Performed by Blair Hailey, MD at [facility] OR    PARS PLANA MECHANICAL VITRECTOMY Left 11/04/2018    Performed by Blair Hailey, MD at [facility] OR    ANGIOGRAPHY CORONARY ARTERY WITH LEFT HEART CATHETERIZATION N/A 01/13/2022    Performed by Marcell Barlow, MD at [facility] CATH LAB    POSSIBLE PERCUTANEOUS CORONARY STENT PLACEMENT WITH ANGIOPLASTY N/A 01/13/2022    Performed by Marcell Barlow, MD at [facility] CATH LAB    Anterior /Posterior Lumbar two -lumbar Five decompression and fusion with infuse and allograft. Left 11/24/2022    Performed by Criss Rosales, MD at [facility] OR    ABDOMEN SURGERY  11/2022    Lumbar Fusion    HX ADENOIDECTOMY      HX JOINT REPLACEMENT  11/2020    Right Shoulder    HX TONSILLECTOMY      KNEE SURGERY  Left Knee Scope 4 yrs ago    NASAL SURGERY      TWICE    SHOULDER ARTHROPLASTY Right 04/2021    Right Shoulder - Injury  UMBILICAL ARTERIAL CATH - BEDSIDE  Less than a year    No Stents Placed       family history includes Arthritis in his father and mother; Back pain in his brother, father, and mother; Cataract in his father and mother; Depression in his brother; Heart Disease in his father; Heart problem in his father; Hypertension in his father; Joint Pain in his brother, father, and mother; Osteoporosis in his mother.    Social History     Socioeconomic History    Marital status: Married     Spouse name: Bonita Quin   Tobacco Use    Smoking status: Former     Packs/day: 1.00     Years: 10.00     Additional pack years: 0.00     Total pack years: 10.00     Types: Cigarettes, Pipe, Cigars     Start date: 08/04/1967     Quit date: 12/22/2006     Years since quitting: 16.1    Smokeless tobacco: Former     Types: Chew     Quit date: 12/22/2006    Tobacco comments:     15 + yrs since last smoked   Vaping Use    Vaping Use: Never used   Substance and Sexual Activity    Alcohol use: Yes     Alcohol/week: 7.0 standard drinks of alcohol     Types: 7 Cans of beer per week     Comment: 1 Beer each night prior to going to bed    Drug use: Never    Sexual activity: Yes     Partners: Female     Birth control/protection: Surgical       Social History     Tobacco Use    Smoking status: Former     Packs/day: 1.00     Years: 10.00     Additional pack years: 0.00     Total pack years: 10.00     Types: Cigarettes, Pipe, Cigars     Start date: 08/04/1967     Quit date: 12/22/2006     Years since quitting: 16.1    Smokeless tobacco: Former     Types: Chew     Quit date: 12/22/2006    Tobacco comments:     15 + yrs since last smoked   Substance Use Topics    Alcohol use: Yes     Alcohol/week: 7.0 standard drinks of alcohol     Types: 7 Cans of beer per week     Comment: 1 Beer each night prior to going to bed        Review of Systems   Constitutional: Negative for chills, diaphoresis, fever, malaise/fatigue, night sweats, weight gain and weight loss.   HENT:  Negative for congestion, sore throat and stridor.    Eyes:  Negative for blurred vision.   Cardiovascular:  Negative for chest pain, orthopnea, palpitations and syncope.   Respiratory:  Negative for cough, shortness of breath, snoring and wheezing.    Skin:  Negative for rash.   Musculoskeletal:  Negative for back pain, joint pain, joint swelling, myalgias and neck pain.   Gastrointestinal:  Negative for abdominal pain, anorexia, change in bowel habit, diarrhea, nausea and vomiting.   Genitourinary:  Negative for dysuria, flank pain and hesitancy.   Neurological:  Negative for disturbances in coordination, excessive daytime sleepiness, dizziness, focal weakness, headaches, numbness, seizures, vertigo and weakness.   Psychiatric/Behavioral:  Negative for depression, hallucinations and memory loss. The patient is  not nervous/anxious.    Allergic/Immunologic: Negative for hives.   All other systems reviewed and are negative.      Objective:      ascorbic acid (VITAMIN C) 500 mg tablet Take one tablet by mouth daily.    cetirizine (ZYRTEC) 10 mg tablet Take one tablet by mouth every morning.    citalopram (CELEXA) 40 mg tablet Take one tablet by mouth daily.    cyclobenzaprine (FLEXERIL) 5 mg tablet Take one tablet to two tablets by mouth daily as needed.    ezetimibe (ZETIA) 10 mg tablet Take one tablet by mouth every morning.    fluticasone propionate (FLONASE) 50 mcg/actuation nasal spray, suspension Apply one spray to each nostril as directed at bedtime daily. Shake bottle gently before using.    folic acid (FOLVITE) 1 mg tablet TAKE 1 TABLET BY MOUTH EVERY DAY (Patient taking differently: Take one tablet by mouth every morning.)    gabapentin (NEURONTIN) 300 mg capsule TAKE 1 CAPSULE BY MOUTH EVERY 8 HOURS (Patient taking differently: Take one capsule by mouth at bedtime daily.)    GLUCOSAMINE SULFATE PO Take 1,500 mg by mouth every morning.    HYDROcodone/acetaminophen (NORCO) 7.5/325 mg tablet Take one tablet by mouth every 4-6 hours as needed for Pain.    losartan (COZAAR) 25 mg tablet TAKE 1 TABLET BY MOUTH DAILY    meloxicam (MOBIC) 15 mg tablet Take one tablet by mouth every morning.    omeprazole DR (PRILOSEC) 40 mg capsule Take one capsule by mouth daily before breakfast.    rosuvastatin (CRESTOR) 20 mg tablet Take one tablet by mouth daily.    zolpidem (AMBIEN) 5 mg tablet TAKE 1 TABLET BY MOUTH AT BEDTIME AS NEEDED FOR SLEEP     Vitals:    01/27/23 1016   BP: 136/84   BP Source: Arm, Right Upper   Pulse: 79   Temp: 36.6 ?C (97.8 ?F)   Resp: 16   SpO2: 98%   TempSrc: Oral   PainSc: Zero   Weight: 83.5 kg (184 lb)   Height: 172.7 cm (5' 8)     Body mass index is 27.98 kg/m?Marland Kitchen     Physical Exam  Vitals and nursing note reviewed.   Constitutional:       General: He is not in acute distress.     Appearance: He is well-developed. He is not diaphoretic.   HENT:      Head: Normocephalic and atraumatic.      Nose: Nose normal.   Eyes:      General: No scleral icterus.        Right eye: No discharge.         Left eye: No discharge.      Conjunctiva/sclera: Conjunctivae normal.      Pupils: Pupils are equal, round, and reactive to light.   Neck:      Thyroid: No thyromegaly.      Vascular: No JVD.      Trachea: No tracheal deviation.   Cardiovascular:      Rate and Rhythm: Normal rate and regular rhythm.      Heart sounds: Normal heart sounds. No murmur heard.     No friction rub.   Pulmonary:      Effort: Pulmonary effort is normal. No respiratory distress.      Breath sounds: Normal breath sounds. No wheezing or rales.   Chest:      Chest wall: No tenderness.   Abdominal:  General: There is no distension.      Palpations: Abdomen is soft. There is no mass.      Tenderness: There is no abdominal tenderness. There is no guarding or rebound.   Musculoskeletal:         General: Normal range of motion.      Cervical back: Normal range of motion and neck supple.   Lymphadenopathy:      Cervical: No cervical adenopathy.   Skin:     General: Skin is warm and dry.   Neurological:      Mental Status: He is alert and oriented to person, place, and time.      Cranial Nerves: No cranial nerve deficit.   Psychiatric:         Mood and Affect: Mood normal.         Behavior: Behavior normal.         Thought Content: Thought content normal.         Judgment: Judgment normal.          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 (Left)   He had an ortho follow up 12/31/22  Stable    Rt shoulder injury at work:  He had surgery 11/2020, and 04/2021  Stable    Gastric ulcer:  He is in clinic for low back pain for 6 weeks as above. 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   He was referred by cardiology to GI because of anemia. He did EGD and colonscopy 01/25/2020 and was found to have a non-bleeding gastric ulcer 15mm that was biopsied and is scheduled for repeat EGD in 2 months. Colonscopy was normal. Results of scopes are sent for scanning. I told him to stop all  NSAID and Mobic at this time  He failed oral iron so cardiology ordered IV Iron.   He is scheduled to repeat EGD 05/07/20. I told him to restart Mobic with food if EGD is back to normal.   He had a repeat EGD 05/07/2020 by his outside GI doctor and was reported that gastric ulcer has healed.    Stable. Avoid NSAID  He had upset stomach recently similar to when he GI bleed before. No blood in stool. He had a bleeding ulcer before so I advised him to talk to his outside GI doctor to get another EGD. His Hb dropped from 13.7 on 09/16/21 to 12.6 on 01/10/22   He had a normal repeat EGD 02/28/22 ( copied sent for scanning)  CBC w diff    Lab Results   Component Value Date/Time    WBC 7.4 11/26/2022 04:09 AM    RBC 3.08 (L) 11/26/2022 04:09 AM    HGB 10.0 (L) 11/26/2022 04:09 AM    HCT 29.1 (L) 11/26/2022 04:09 AM    MCV 94.4 11/26/2022 04:09 AM    MCH 32.4 11/26/2022 04:09 AM    MCHC 34.3 11/26/2022 04:09 AM    RDW 13.0 11/26/2022 04:09 AM  PLTCT 200 11/26/2022 04:09 AM    MPV 6.4 (L) 11/26/2022 04:09 AM    Lab Results   Component Value Date/Time    NEUT 62 11/05/2022 01:05 PM    ANC 3.00 11/05/2022 01:05 PM    LYMA 25 11/05/2022 01:05 PM    ALC 1.20 11/05/2022 01:05 PM    MONA 10 11/05/2022 01:05 PM    AMC 0.47 11/05/2022 01:05 PM    EOSA 3 11/05/2022 01:05 PM    AEC 0.17 11/05/2022 01:05 PM    BASA 0 11/05/2022 01:05 PM    ABC 0.02 11/05/2022 01:05 PM        Shingles:  He had left arm and left chest wall shingles in 10/2021.     Hypertension:  He is on HCTZ and Losarten  BP Readings from Last 3 Encounters:   01/27/23 136/84   12/31/22 131/85   11/26/22 122/61   continue  He had stress test and echo in 02/2016 that were reported as normal and I reviewed in care Every where  He saw cardiology and had a stress test 10/2021  Stable    Coronary calcification:  CT calcium scan 08/2019:  Quantitative Coronary Calcium Score:   Left main: 0   LAD: 114   LCX: 144   RCA: 5 7   Total calcium score: 315   He saw cardiology and had echo and normal stress test 10/2019 and has a follow up 12/2019 and 09/16/21  He saw Cardiology 09/16/21 and 11/2021:  He had inconclusive coronary CT study and stress test so he ended with cardiac cath a/23/23 which showed no critical stenosis.   Doing well    Renal mass:  His calcium score was 315. He saw cardiology 12/2019. He had CTA chest which was normal but There was incidental cholelithiasis and a right renal lesion that will require ultrasound for further definition.     He had a normal stress test 11/09/2020  He had abdomen US 01/30/2020 which showed:  Small hypoechoic exophytic right renal nodule most compatible with a   hemorrhagic cyst   Cholelithiasis   Mild bladder wall thickening, nonspecific but most likely secondary to   bladder outlet stenosis or neurogenic bladder   Stable    Hyperlipidemia:  He is on Zocor 20mg  daily but did not take for 30 days. He had cramps with Lipitor.  We increased Zocor to 40mg  daily 07/19/2019  Cardiology switched him to Crestor 10mg  daily and Zetia  Plan on repeat labs in 3 months  Lab Results   Component Value Date    CHOL 108 11/05/2022    TRIG 86 11/05/2022    HDL 52 11/05/2022    LDL 45 11/05/2022    VLDL 17 11/05/2022    NONHDLCHOL 56 11/05/2022      Anxiety and Depression:   He had for 20 years  He is on Effexor and Remeron  He is not feeling that this combination is helping  He is homicidal or suicidal but his brother committed suicide in 2013  We got him to taper off his medications over2 weeks and we he started Celexa 20mg  daily after he tapered off  We referred to psychologist  I am referring this patient to the behavior health consultant for the following conditions:  Health Risk Behaviors:  Stress management  Mental Health Conditions:  Depression and Anxiety  We increased Celexa from 20 to 40mg  daily back in 08/2019  He is doing better    History of  sleep apnea:  He is not snoring after he lost weight  He struggles with staying sleep and Ambien did not help  He saw sleep clinic 05/2021  Refill    Weakness:  Normal labs and TSH and B12 level   He is on ketogenic diet    Hearing deficits:  He has hearing aids    Routine health maintenance:    Colonoscopy: 2014, normal  Tdap: 2015  Pneumovax: Prevnar 07/19/2019 and plan on pneumovax in a year  Influenza:09/2018, 10/2019, 11/2021  Eye exam: 2019  Dental exam: 2019  AAA screening: 07/2019  He had COVID vaccine 10/2020, and today 01/20/22    Return to clinic in 4 months with labs    Total of 45 minutes were spent on the same day of the visit including preparing to see the patient, obtaining and/or reviewing separately obtained history, performing a medically appropriate examination and/or evaluation, counseling and educating the patient/family/caregiver, ordering medications, tests, or procedures, referring and communication with other health care professionals, documenting clinical information in the electronic or other health record, independently interpreting results and communicating results to the patient/family/caregiver, and care coordination.   I Counseled patient regarding low back pain, depression and weakness. We discussed safety during COVID19 pandemic.     Orders Placed This Encounter    COVID-19 VACCINE (>=12YO) (MODERNA) (2023 -2024 FORMULA) MRNA PF    influenza (=>65 YO) high dose QUADrivalent (FLUZONE) vaccine PF    CBC AND DIFF    BASIC METABOLIC PANEL    HEMOGLOBIN A1C    LIPID PROFILE    LIVER FUNCTION PANEL    MICROALB/CR RATIO-URINE RANDOM    TSH WITH FREE T4 REFLEX    citalopram (CELEXA) 40 mg tablet    rosuvastatin (CRESTOR) 20 mg tablet     Encounter Medications   Medications    citalopram (CELEXA) 40 mg tablet     Sig: Take one tablet by mouth daily.     Dispense:  90 tablet     Refill:  3    rosuvastatin (CRESTOR) 20 mg tablet     Sig: Take one tablet by mouth daily.     Dispense:  90 tablet     Refill:  3     Future Appointments   Date Time Provider Department Center   03/06/2023  9:30 AM [facility]   03/20/2023  8:00 AM Kathie Rhodes, PA-C [facility] Exam     Patient Instructions   Get fasting labs few days before return to clinic in 6 months

## 2023-01-28 DIAGNOSIS — E782 Mixed hyperlipidemia: Secondary | ICD-10-CM

## 2023-01-28 DIAGNOSIS — F419 Anxiety disorder, unspecified: Secondary | ICD-10-CM

## 2023-01-28 DIAGNOSIS — I10 Essential (primary) hypertension: Secondary | ICD-10-CM

## 2023-01-28 DIAGNOSIS — R7301 Impaired fasting glucose: Secondary | ICD-10-CM

## 2023-01-28 DIAGNOSIS — K2100 Gastro-esophageal reflux disease with esophagitis, without bleeding: Secondary | ICD-10-CM

## 2023-01-28 DIAGNOSIS — F32A Depression, unspecified: Secondary | ICD-10-CM

## 2023-02-16 ENCOUNTER — Encounter: Admit: 2023-02-16 | Discharge: 2023-02-16 | Payer: BC Managed Care – PPO

## 2023-02-16 DIAGNOSIS — Z09 Encounter for follow-up examination after completed treatment for conditions other than malignant neoplasm: Secondary | ICD-10-CM

## 2023-02-22 ENCOUNTER — Encounter: Admit: 2023-02-22 | Discharge: 2023-02-22 | Payer: BC Managed Care – PPO

## 2023-02-22 DIAGNOSIS — F419 Anxiety disorder, unspecified: Secondary | ICD-10-CM

## 2023-02-22 DIAGNOSIS — I10 Essential (primary) hypertension: Secondary | ICD-10-CM

## 2023-02-22 DIAGNOSIS — E782 Mixed hyperlipidemia: Secondary | ICD-10-CM

## 2023-02-22 MED ORDER — GABAPENTIN 300 MG PO CAP
ORAL_CAPSULE | 3 refills | Status: AC
Start: 2023-02-22 — End: ?

## 2023-03-02 NOTE — Patient Instructions
It was a pleasure seeing you in clinic today.    Willa Frater BSN, RN  Clinical Nurse Coordinator  Dr. Vonna Kotyk T. [facility]  [address], [place]  [place], [place] [address]  Phone: [phone]  Fax [phone]   Scheduling [phone]  [email or web address]    We appreciate the interest in your health.  The physician reviews both the radiology reports and the imaging independently and will contact you if there are any emergent findings.  If you would like, incidental and chronic changes seen in your images can be reviewed with Dr. Franki Cabot at a follow up appointment.  At that appointment, the radiology report and terminology used in that report can also be explained. Please call our Schedule line if you would like to follow up.

## 2023-03-06 ENCOUNTER — Encounter: Admit: 2023-03-06 | Discharge: 2023-03-06 | Payer: BC Managed Care – PPO

## 2023-03-06 ENCOUNTER — Ambulatory Visit: Admit: 2023-03-06 | Discharge: 2023-03-06 | Payer: BC Managed Care – PPO

## 2023-03-06 DIAGNOSIS — IMO0002 Ulcer: Secondary | ICD-10-CM

## 2023-03-06 DIAGNOSIS — I2510 Atherosclerotic heart disease of native coronary artery without angina pectoris: Secondary | ICD-10-CM

## 2023-03-06 DIAGNOSIS — R9430 Abnormal result of cardiovascular function study, unspecified: Secondary | ICD-10-CM

## 2023-03-06 DIAGNOSIS — Z09 Encounter for follow-up examination after completed treatment for conditions other than malignant neoplasm: Secondary | ICD-10-CM

## 2023-03-06 DIAGNOSIS — M2550 Pain in unspecified joint: Secondary | ICD-10-CM

## 2023-03-06 DIAGNOSIS — D649 Anemia, unspecified: Secondary | ICD-10-CM

## 2023-03-06 DIAGNOSIS — E785 Hyperlipidemia, unspecified: Secondary | ICD-10-CM

## 2023-03-06 DIAGNOSIS — F32A Depression: Secondary | ICD-10-CM

## 2023-03-06 DIAGNOSIS — M4800 Spinal stenosis, site unspecified: Secondary | ICD-10-CM

## 2023-03-06 DIAGNOSIS — M48062 Spinal stenosis, lumbar region with neurogenic claudication: Secondary | ICD-10-CM

## 2023-03-06 DIAGNOSIS — M75120 Complete rotator cuff tear or rupture of unspecified shoulder, not specified as traumatic: Secondary | ICD-10-CM

## 2023-03-06 DIAGNOSIS — K219 Gastro-esophageal reflux disease without esophagitis: Secondary | ICD-10-CM

## 2023-03-06 DIAGNOSIS — S42121A Displaced fracture of acromial process, right shoulder, initial encounter for closed fracture: Secondary | ICD-10-CM

## 2023-03-06 DIAGNOSIS — M5136 Other intervertebral disc degeneration, lumbar region: Secondary | ICD-10-CM

## 2023-03-06 DIAGNOSIS — M4316 Spondylolisthesis, lumbar region: Secondary | ICD-10-CM

## 2023-03-06 DIAGNOSIS — Z981 Arthrodesis status: Secondary | ICD-10-CM

## 2023-03-06 DIAGNOSIS — H9190 Unspecified hearing loss, unspecified ear: Secondary | ICD-10-CM

## 2023-03-06 DIAGNOSIS — M2560 Stiffness of unspecified joint, not elsewhere classified: Secondary | ICD-10-CM

## 2023-03-06 DIAGNOSIS — H269 Unspecified cataract: Secondary | ICD-10-CM

## 2023-03-06 DIAGNOSIS — R519 Generalized headaches: Secondary | ICD-10-CM

## 2023-03-06 DIAGNOSIS — G4730 Sleep apnea, unspecified: Secondary | ICD-10-CM

## 2023-03-06 DIAGNOSIS — M1990 Unspecified osteoarthritis, unspecified site: Secondary | ICD-10-CM

## 2023-03-06 DIAGNOSIS — I10 Essential (primary) hypertension: Secondary | ICD-10-CM

## 2023-03-20 ENCOUNTER — Encounter: Admit: 2023-03-20 | Discharge: 2023-03-20 | Payer: BC Managed Care – PPO

## 2023-03-20 ENCOUNTER — Ambulatory Visit: Admit: 2023-03-20 | Discharge: 2023-03-21 | Payer: BC Managed Care – PPO

## 2023-03-20 DIAGNOSIS — D649 Anemia, unspecified: Secondary | ICD-10-CM

## 2023-03-20 DIAGNOSIS — K219 Gastro-esophageal reflux disease without esophagitis: Secondary | ICD-10-CM

## 2023-03-20 DIAGNOSIS — G4730 Sleep apnea, unspecified: Secondary | ICD-10-CM

## 2023-03-20 DIAGNOSIS — R002 Palpitations: Secondary | ICD-10-CM

## 2023-03-20 DIAGNOSIS — I10 Essential (primary) hypertension: Secondary | ICD-10-CM

## 2023-03-20 DIAGNOSIS — R931 Abnormal findings on diagnostic imaging of heart and coronary circulation: Secondary | ICD-10-CM

## 2023-03-20 DIAGNOSIS — F32A Depression: Secondary | ICD-10-CM

## 2023-03-20 DIAGNOSIS — H9190 Unspecified hearing loss, unspecified ear: Secondary | ICD-10-CM

## 2023-03-20 DIAGNOSIS — I2510 Atherosclerotic heart disease of native coronary artery without angina pectoris: Secondary | ICD-10-CM

## 2023-03-20 DIAGNOSIS — M75120 Complete rotator cuff tear or rupture of unspecified shoulder, not specified as traumatic: Secondary | ICD-10-CM

## 2023-03-20 DIAGNOSIS — M2560 Stiffness of unspecified joint, not elsewhere classified: Secondary | ICD-10-CM

## 2023-03-20 DIAGNOSIS — M2550 Pain in unspecified joint: Secondary | ICD-10-CM

## 2023-03-20 DIAGNOSIS — I25118 Atherosclerotic heart disease of native coronary artery with other forms of angina pectoris: Secondary | ICD-10-CM

## 2023-03-20 DIAGNOSIS — D509 Iron deficiency anemia, unspecified: Secondary | ICD-10-CM

## 2023-03-20 DIAGNOSIS — E785 Hyperlipidemia, unspecified: Secondary | ICD-10-CM

## 2023-03-20 DIAGNOSIS — R519 Generalized headaches: Secondary | ICD-10-CM

## 2023-03-20 DIAGNOSIS — M4800 Spinal stenosis, site unspecified: Secondary | ICD-10-CM

## 2023-03-20 DIAGNOSIS — M1990 Unspecified osteoarthritis, unspecified site: Secondary | ICD-10-CM

## 2023-03-20 DIAGNOSIS — R9430 Abnormal result of cardiovascular function study, unspecified: Secondary | ICD-10-CM

## 2023-03-20 DIAGNOSIS — E782 Mixed hyperlipidemia: Secondary | ICD-10-CM

## 2023-03-20 DIAGNOSIS — M5136 Other intervertebral disc degeneration, lumbar region: Secondary | ICD-10-CM

## 2023-03-20 DIAGNOSIS — IMO0002 Ulcer: Secondary | ICD-10-CM

## 2023-03-20 DIAGNOSIS — H269 Unspecified cataract: Secondary | ICD-10-CM

## 2023-03-20 DIAGNOSIS — S42121A Displaced fracture of acromial process, right shoulder, initial encounter for closed fracture: Secondary | ICD-10-CM

## 2023-03-20 NOTE — Patient Instructions
Follow-Up:    -Thank you for allowing [place] to participate in your care today. Your After Visit Summary is being completed by Rosalita Chessman, RN.    -We would like you to follow up in  6 months with Harrie Foreman, MD  -The schedule is released approximately 4-5 months in advance. Please call [phone] to schedule your appointment.      Changes From Today's Office Visit   No medication changes today.    Contacting our office:    -For NON-URGENT questions please contact us via message through your MyChart account.   -For all medication refills please contact your pharmacy or send a request through MyChart.     -For all questions that may need to be addressed urgently please call the [facility] nursing triage line at [phone] Monday - Friday 8am-5pm only. Please leave a detailed message with your name, date of birth, and reason for your call.  If your message is received before 3:30pm, every effort will be made to call you back the same day.  Please allow time for [place] to review your chart prior to call back.     -Should you have an urgent concern over the weekend/nights, the on-call triage line is [phone].    -[facility] nursing team fax number: [phone]    -You may receive a survey in the upcoming weeks from [facility]. Your feedback is important to [place] and helps [place] continue to improve patient care and patient satisfaction.     -Please feel free to call our Financial Department at [phone] with any questions or concerns about estimated cost of testing or imaging ordered today. We are happy to provide CPT codes upon request.    Results & Testing Follow Up:    -Please allow 5-7 business days for the results of any testing to be reviewed. Please call our office if you have not heard from a nurse within this time frame.    -Should you choose to complete testing at an outside facility, please contact our office after completion of testing so that we can ensure that we have received results for your provider to review.    Lab and test results:  As a part of the CARES act, starting 03/22/2020, some results will be released to you via MyChart immediately and automatically.  You may see results before your provider sees them; however, your provider will review all these results and then they, or one of their team, will notify you of result information and recommendations.   Critical results will be addressed immediately, but otherwise, please allow [place] time to get back with you prior to you reaching out to [place] for questions.  This will usually take about 72 hours for labs and 5-7 days for procedure test results.

## 2023-03-20 NOTE — Progress Notes
Date of Service: 03/20/2023    Jared Hebert is a 72 y.o. male.       HPI     I had the pleasure of seeing  Jared Hebert, for a six month follow up visit in the Cardiovascular Medicine clinic at [facility].     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     He was seen by Dr. Neale Burly in September for cardiac clearance prior to an anticipated lumbar surgery.   He initially had a screening calcium scan in September 2020 with a total Agatston score of  315.  He has had subsequent echocardiograms and stress test at [facility] in November 2020 which were unremarkable.  He had a coronary CTA on 12/03/2021 that was interpreted as moderate to severe three-vessel disease, this led to a subsequent heart catheterization.  This was carried out on 01/13/22 which showed a 30%-40% LAD stenosis followed by a 50% midvessel stenosis. There was a 50% diagonal lesion.  The ramus intermediate had a 60% lesion.  FFR evaluation was completed on all concerning areas of stenosis, they were all negative. He was continued on aggressive risk management.  Since he remained asymptomatic he was cleared from a CV standpoint.    Jared Hebert remains doing well from a cardiac perspective.  He had lumbar fusion and decompression per Dr. Liam Rogers early December and has had significant improvement in his lower back pain and radiculopathy.  He is still waiting for physical therapy which is scheduled in June.  He indicates that he noticed occasional feet swelling towards the evening hours but attributes this to being sedentary.  He denies orthopnea or PND.  He indicates that he has been sedentary due to surgical restrictions and is gaining 20 pounds.  He is now on a 25 g carbohydrate restriction.         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 No significant valvular abnormalities.  3/13/7- [facility] MPI [facility]): LVEF 66%. Exercise gated SPECT myocardial perfusion study appears within normal limits. Excellent functional capacity, attaining a workload of 10.2 METs.   09/21/19- Coronary Calcium Score: Left main: 0 LAD: 114 LCX: 144 RCA: 5 7 Total calcium score:  315.  11/10/19- Echo: LVEF 60%. Mild left ventricular diastolic dysfunction. . No significant valvular abnormalities. No pericardial effusion.  11/10/19- [facility] MPI: LVEF 60%. This study is normal and represents low probability for significant inducible myocardial ischemia. There are no perfusion abnormalities. All myocardial segments appear viable. Regional and global left ventricular function are normal.  High risk scintigraphic features are absent.   10/25/21- Echo: LVEF 60-65%.  LV wall thickness. No regional wall motion abnormality. Mild MR. CVP 3 mmHg.  Peak PA systolic pressure 24 mmHg.  10/25/21- [facility] MPI: LVEF 60%. This myocardial pressure imaging study did not demonstrate any ischemia on the tomographic pattern, it was no evidence of fixed or reversible perfusion abnormalities. Transient ischemic dilatation was present. The pharmacologic ECG portion of the study is negative for ischemia.  12/04/21- CCTA: Moderate to severe proximal three-vessel coronary disease in the circumflex LAD and diagonal distributions, as well as the obtuse marginal branch of the nondominant circumflex. Consider computational FFR for verification and identification of the vessels with the most significant potential for significant hemodynamic obstruction  01/13/22- Left Heart Catheterization: Moderate nonobstructive coronary artery disease involving the mid left anterior descending, ramus intermedius, and the mid right coronary artery. LAD,  proximal long segment of 30% to 40% stenosis, mid LAD has a focal area of 50% to 60% stenosis.  This is a type 3 LAD.  The LAD gives rise to a diagonal branch, which has a 40% to 50% stenosis in the mid segment. Ramus Intermedius, proximal to mid segment of the ramus intermedius has a long segment of 60% to 70% stenosis. LCX 30% stenosis at the ostium. RCA, long segment of 50% to 60% stenosis in its mid segment.               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  He was started on Ambien 5 mg about 2020.  He currently takes it about half the nights of the week and does find it helpful.  He denies any side effects.      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   HENT: Negative.     Eyes: Negative.    Cardiovascular: Negative.    Respiratory: Negative.     Endocrine: Negative.    Hematologic/Lymphatic: Negative.    Skin: Negative.    Musculoskeletal: Negative.    Gastrointestinal: Negative.    Genitourinary: Negative.    Neurological: Negative.    Psychiatric/Behavioral: Negative.     Allergic/Immunologic: Negative.        Physical Exam  General Appearance: no acute distress  Skin: warm & intact  HEENT: unremarkable  Neck Veins: neck veins are flat & not  distended  Carotid Arteries: no bruits  Chest Inspection: chest is normal in appearance  Auscultation/Percussion: lungs clear to auscultation, no rales, rhonchi, or wheezing  Cardiac Rhythm: regular rhythm & normal rate  Cardiac Auscultation: Normal S1 & S2, no S3 or S4, no rub  Murmurs: no cardiac murmurs   Extremities: no lower extremity edema; 2+ symmetric distal pulses  Abdominal Exam: soft, non-tender, no masses, bowel sounds normal  Liver & Spleen: no organomegaly  Neurologic Exam: oriented to time, place and person; no focal neurologic deficits  Psychiatric: Normal mood and affect.  Behavior is normal. Judgment and thought content normal.     Cardiovascular Studies  Preliminary EKG shows sinus rhythm first-degree AV block, PR interval 218 ms.  No concerning ST/T wave changes.      Cardiovascular Health Factors  Vitals BP Readings from Last 3 Encounters:   03/20/23 122/88   03/06/23 (!) 143/77   01/27/23 136/84     Wt Readings from Last 3 Encounters:   03/20/23 87.1 kg (192 lb)   03/06/23 82.1 kg (181 lb)   01/27/23 83.5 kg (184 lb)     BMI Readings from Last 3 Encounters:   03/20/23 29.19 kg/m?   03/06/23 27.52 kg/m?   01/27/23 27.98 kg/m?      Smoking Social History     Tobacco Use   Smoking Status Former    Current packs/day: 0.00    Average packs/day: 1 pack/day for 39.4 years (39.4 ttl pk-yrs)    Types: Cigarettes, Pipe, Cigars    Start date: 08/04/1967    Quit date: 12/22/2006    Years since quitting: 16.2   Smokeless Tobacco Former    Types: Chew    Quit date: 12/22/2006   Tobacco Comments    15 + yrs since last smoked      Lipid Profile Cholesterol   Date Value Ref Range Status   11/05/2022 108 <200 MG/DL Final     HDL   Date Value Ref Range Status   11/05/2022 52 >40 MG/DL Final     LDL   Date Value Ref Range Status   11/05/2022 45 <100 mg/dL Final     Triglycerides   Date Value Ref Range Status   11/05/2022 86 <150 MG/DL Final      Blood Sugar Hemoglobin A1C   Date Value Ref Range Status   11/05/2022 5.2 4.0 - 5.7 % Final     Comment:     The ADA recommends that most patients with type 1 and type 2 diabetes maintain   an A1c level <7%.       Glucose   Date Value Ref Range Status   11/26/2022 110 (H) 70 - 100 MG/DL Final   91/47/8295 621 (H) 70 - 100 MG/DL Final   30/86/5784 696 (H) 70 - 100 MG/DL Final          Problems Addressed Today  Encounter Diagnoses   Name Primary?    Screening for heart disease Yes    Palpitations     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     Iron deficiency anemia, unspecified iron deficiency anemia type     Atherosclerotic heart disease of native coronary artery with other forms of angina pectoris (HCC)     Mixed hyperlipidemia     Hypertension, essential        Assessment and Plan     1.  Moderate nonobstructive coronary disease per cardiac catheterization in January 2023.  His  heart cath showed 30 to 40% LAD followed by a 50% mid stenosis, ramus intermedius had a 60% lesion.  FFR was negative for significant disease and remains on aggressive risk management.  Unfortunately he developed gastric ulcer several years ago when he was on low-dose and meloxicam.  He has had no recurrence on meloxicam alone.    2.  Primary hypertension.  His blood pressure was initially 132/90 with a repeat of 122/88.  I made no further adjustments and will continue with low-dose losartan 25 mg daily.  I have asked Jared Hebert to monitor his blood pressure at home with goal systolics less than 130, diastolics less than 85.       3.  Prior OSA.  He was previously using a CPAP however had lost 65 pounds over 3 years ago and was not indicated.  Repeat sleep study in January 2023 was negative for OSA.    4.  Dyslipidemia.  He remains on rosuvastatin 20 mg and Zetia 10 mg daily without myalgias.  His lipid panel in November showed goal LDL 45      He will follow-up with Dr. Neale Burly in 6 months.    Thank you for allowing me to participate in Jared Hebert care.  Please feel free to contact me if I can be of further assistance.    Roberts Gaudy PA-c             Current Medications (including today's revisions)   ascorbic acid (VITAMIN C) 500 mg tablet Take one tablet by mouth daily.    cetirizine (ZYRTEC) 10 mg tablet Take one tablet by mouth every morning.    citalopram (CELEXA) 40 mg tablet Take one tablet by mouth daily.    cyclobenzaprine (FLEXERIL) 5 mg tablet Take one tablet to two tablets by mouth daily as needed.    ezetimibe (ZETIA) 10 mg tablet Take one tablet by mouth every morning.    fluticasone propionate (FLONASE) 50 mcg/actuation nasal spray, suspension Apply one spray to each nostril as directed at bedtime daily. Shake bottle gently before using.    folic acid (FOLVITE) 1 mg tablet TAKE 1 TABLET BY MOUTH EVERY DAY (Patient taking differently: Take one tablet by mouth every morning.)    gabapentin (NEURONTIN) 300 mg capsule TAKE 1 CAPSULE BY MOUTH EVERY 8 HOURS    GLUCOSAMINE SULFATE PO Take 1,500 mg by mouth every morning.    losartan (COZAAR) 25 mg tablet TAKE 1 TABLET BY MOUTH DAILY    meloxicam (MOBIC) 15 mg tablet Take one tablet by mouth every morning.    omeprazole DR (PRILOSEC) 40 mg capsule Take one capsule by mouth daily before breakfast.    rosuvastatin (CRESTOR) 20 mg tablet Take one tablet by mouth daily.    zolpidem (AMBIEN) 5 mg tablet TAKE 1 TABLET BY MOUTH AT BEDTIME AS NEEDED FOR SLEEP

## 2023-03-21 DIAGNOSIS — Z136 Encounter for screening for cardiovascular disorders: Secondary | ICD-10-CM

## 2023-03-29 ENCOUNTER — Encounter: Admit: 2023-03-29 | Discharge: 2023-03-29 | Payer: BC Managed Care – PPO

## 2023-03-29 DIAGNOSIS — F419 Anxiety disorder, unspecified: Secondary | ICD-10-CM

## 2023-03-29 DIAGNOSIS — K21 Gastroesophageal reflux disease with esophagitis without hemorrhage: Secondary | ICD-10-CM

## 2023-03-29 DIAGNOSIS — E782 Mixed hyperlipidemia: Secondary | ICD-10-CM

## 2023-03-29 DIAGNOSIS — I10 Essential (primary) hypertension: Secondary | ICD-10-CM

## 2023-03-29 DIAGNOSIS — F5101 Primary insomnia: Secondary | ICD-10-CM

## 2023-03-29 DIAGNOSIS — Z0000 Encounter for general adult medical examination without abnormal findings: Secondary | ICD-10-CM

## 2023-03-29 MED ORDER — ZOLPIDEM 5 MG PO TAB
5 mg | ORAL_TABLET | Freq: Every evening | ORAL | 0 refills | PRN
Start: 2023-03-29 — End: ?

## 2023-04-27 ENCOUNTER — Encounter: Admit: 2023-04-27 | Discharge: 2023-04-27 | Payer: BC Managed Care – PPO

## 2023-04-27 DIAGNOSIS — F419 Anxiety disorder, unspecified: Secondary | ICD-10-CM

## 2023-04-27 DIAGNOSIS — Z0000 Encounter for general adult medical examination without abnormal findings: Secondary | ICD-10-CM

## 2023-04-27 DIAGNOSIS — E782 Mixed hyperlipidemia: Secondary | ICD-10-CM

## 2023-04-27 DIAGNOSIS — F5101 Primary insomnia: Secondary | ICD-10-CM

## 2023-04-27 DIAGNOSIS — K21 Gastroesophageal reflux disease with esophagitis without hemorrhage: Secondary | ICD-10-CM

## 2023-04-27 DIAGNOSIS — I10 Essential (primary) hypertension: Secondary | ICD-10-CM

## 2023-04-27 MED ORDER — ZOLPIDEM 5 MG PO TAB
5 mg | ORAL_TABLET | Freq: Every evening | ORAL | 0 refills | PRN
Start: 2023-04-27 — End: ?

## 2023-05-13 ENCOUNTER — Encounter: Admit: 2023-05-13 | Discharge: 2023-05-13 | Payer: BC Managed Care – PPO

## 2023-05-13 DIAGNOSIS — Z09 Encounter for follow-up examination after completed treatment for conditions other than malignant neoplasm: Secondary | ICD-10-CM

## 2023-05-13 NOTE — Patient Instructions
It was a pleasure seeing you in clinic today.    Crystol Walpole BSN, RN  Clinical Nurse Coordinator  Dr. Joshua T. Bunch  [facility]  [facility]  [address], [address]  [place], [place] [address]  Phone: [phone]  Fax [phone]   Scheduling [phone]  [email or web address]    We appreciate the interest in your health.  The physician reviews both the radiology reports and the imaging independently and will contact you if there are any emergent findings.  If you would like, incidental and chronic changes seen in your images can be reviewed with Dr. Bunch at a follow up appointment.  At that appointment, the radiology report and terminology used in that report can also be explained. Please call our Schedule line if you would like to follow up.

## 2023-05-14 ENCOUNTER — Encounter: Admit: 2023-05-14 | Discharge: 2023-05-14 | Payer: BC Managed Care – PPO

## 2023-05-14 MED ORDER — FOLIC ACID 1 MG PO TAB
ORAL_TABLET | 2 refills
Start: 2023-05-14 — End: ?

## 2023-05-22 ENCOUNTER — Encounter: Admit: 2023-05-22 | Discharge: 2023-05-22 | Payer: BC Managed Care – PPO

## 2023-05-22 MED ORDER — EZETIMIBE 10 MG PO TAB
10 mg | ORAL_TABLET | ORAL | 1 refills
Start: 2023-05-22 — End: ?

## 2023-05-26 ENCOUNTER — Encounter: Admit: 2023-05-26 | Discharge: 2023-05-26 | Payer: BC Managed Care – PPO

## 2023-05-26 DIAGNOSIS — F419 Anxiety disorder, unspecified: Secondary | ICD-10-CM

## 2023-05-26 DIAGNOSIS — I10 Essential (primary) hypertension: Secondary | ICD-10-CM

## 2023-05-26 DIAGNOSIS — F5101 Primary insomnia: Secondary | ICD-10-CM

## 2023-05-26 DIAGNOSIS — Z0000 Encounter for general adult medical examination without abnormal findings: Secondary | ICD-10-CM

## 2023-05-26 DIAGNOSIS — K21 Gastroesophageal reflux disease with esophagitis without hemorrhage: Secondary | ICD-10-CM

## 2023-05-26 DIAGNOSIS — E782 Mixed hyperlipidemia: Secondary | ICD-10-CM

## 2023-05-26 MED ORDER — ZOLPIDEM 5 MG PO TAB
5 mg | ORAL_TABLET | Freq: Every evening | ORAL | 0 refills | 30.00000 days | Status: AC | PRN
Start: 2023-05-26 — End: ?

## 2023-05-26 NOTE — Telephone Encounter
Some refill protocol elements NOT Met  Medication name: Ambien  Medication Strength: 5 mg    Last Fill Date: 04/28/23    Non-delegated medication    Routed to Provider

## 2023-06-05 ENCOUNTER — Ambulatory Visit: Admit: 2023-06-05 | Discharge: 2023-06-05 | Payer: BC Managed Care – PPO

## 2023-06-05 ENCOUNTER — Encounter: Admit: 2023-06-05 | Discharge: 2023-06-05 | Payer: BC Managed Care – PPO

## 2023-06-05 DIAGNOSIS — Z09 Encounter for follow-up examination after completed treatment for conditions other than malignant neoplasm: Secondary | ICD-10-CM

## 2023-06-05 DIAGNOSIS — M48062 Spinal stenosis, lumbar region with neurogenic claudication: Secondary | ICD-10-CM

## 2023-06-05 NOTE — Progress Notes
[facility] CLINIC NOTE    Blood pressure 126/71, pulse 67, height 172.7 cm (5' 8), weight 82 kg (180 lb 12.8 oz), SpO2 97%.    SOAP NOTE DICTATION:     Dictation on: 06/05/2023  9:17 AM by: Sue Lush [RSTUCKER]

## 2023-06-27 ENCOUNTER — Encounter: Admit: 2023-06-27 | Discharge: 2023-06-27 | Payer: BC Managed Care – PPO

## 2023-06-27 DIAGNOSIS — F419 Anxiety disorder, unspecified: Secondary | ICD-10-CM

## 2023-06-27 DIAGNOSIS — K21 Gastroesophageal reflux disease with esophagitis without hemorrhage: Secondary | ICD-10-CM

## 2023-06-27 DIAGNOSIS — Z0000 Encounter for general adult medical examination without abnormal findings: Secondary | ICD-10-CM

## 2023-06-27 DIAGNOSIS — F5101 Primary insomnia: Secondary | ICD-10-CM

## 2023-06-27 DIAGNOSIS — E782 Mixed hyperlipidemia: Secondary | ICD-10-CM

## 2023-06-27 DIAGNOSIS — I10 Essential (primary) hypertension: Secondary | ICD-10-CM

## 2023-06-27 MED ORDER — ZOLPIDEM 5 MG PO TAB
5 mg | ORAL_TABLET | Freq: Every evening | ORAL | 0 refills | PRN
Start: 2023-06-27 — End: ?

## 2023-07-03 ENCOUNTER — Encounter: Admit: 2023-07-03 | Discharge: 2023-07-03 | Payer: BC Managed Care – PPO

## 2023-07-03 MED ORDER — OMEPRAZOLE 40 MG PO CPDR
40 mg | ORAL_CAPSULE | Freq: Every day | ORAL | 1 refills | Status: AC
Start: 2023-07-03 — End: ?

## 2023-07-21 ENCOUNTER — Encounter: Admit: 2023-07-21 | Discharge: 2023-07-21 | Payer: BC Managed Care – PPO

## 2023-07-21 NOTE — Progress Notes
Pt hospitalized since last office visit: No    Health Maintenance Due   Topic Date Due    HEPATITIS C SCREENING  Never done    SHINGLES RECOMBINANT VACCINE (1 of 2) Never done    RSV VACCINE (60 YEARS AND OLDER) (1 - 1-dose 60+ series) Never done    COVID-19 VACCINE (6 - 2023-24 season) 05/28/2023       There are no diagnoses linked to this encounter.    Labs not done:      Lab Frequency Next Occurrence   REQUEST FOR CARDIOLOGY APPOINTMENT Once 07/10/2022   LIPID PROFILE Once 09/26/2022   URINALYSIS DIPSTICK REFLEX TO CULTURE Once 08/26/2022   URINALYSIS MICROSCOPIC REFLEX TO CULTURE Once 08/26/2022   UA GREY TOP TUBE Once 08/26/2022   CLEAR TOP EXTRA URINE TUBE Once 08/26/2022   URINALYSIS DIPSTICK Once 10/22/2022   URINALYSIS, MICROSCOPIC Once 10/22/2022   REQUEST FOR CARDIOLOGY APPOINTMENT Once 09/16/2023   CBC AND DIFF Once 05/28/2023   BASIC METABOLIC PANEL Once 05/28/2023   HEMOGLOBIN A1C Once 05/28/2023   LIPID PROFILE Once 05/28/2023   LIVER FUNCTION PANEL Once 05/28/2023   MICROALB/CR RATIO-URINE RANDOM Once 05/28/2023   TSH WITH FREE T4 REFLEX Once 05/28/2023   REQUEST FOR CARDIOLOGY APPOINTMENT Once 09/20/2023       MyChart message sent to patient on 07/21/23 Gibson Ramp, RN     Notes to provider:

## 2023-07-25 ENCOUNTER — Ambulatory Visit: Admit: 2023-07-25 | Discharge: 2023-07-25 | Payer: BC Managed Care – PPO

## 2023-07-25 ENCOUNTER — Encounter: Admit: 2023-07-25 | Discharge: 2023-07-25 | Payer: MEDICARE

## 2023-07-25 DIAGNOSIS — K21 Gastroesophageal reflux disease with esophagitis without hemorrhage: Secondary | ICD-10-CM

## 2023-07-25 DIAGNOSIS — F419 Anxiety disorder, unspecified: Secondary | ICD-10-CM

## 2023-07-25 DIAGNOSIS — E782 Mixed hyperlipidemia: Secondary | ICD-10-CM

## 2023-07-25 DIAGNOSIS — I10 Essential (primary) hypertension: Secondary | ICD-10-CM

## 2023-07-25 DIAGNOSIS — R7301 Impaired fasting glucose: Secondary | ICD-10-CM

## 2023-07-25 LAB — CBC AND DIFF
ABSOLUTE BASO COUNT: 0 10*3/uL (ref 0–0.20)
ABSOLUTE EOS COUNT: 0.1 10*3/uL (ref 0–0.45)
ABSOLUTE LYMPH COUNT: 1.2 10*3/uL (ref 1.0–4.8)
ABSOLUTE MONO COUNT: 0.4 10*3/uL (ref 0–0.80)
ABSOLUTE NEUTROPHIL: 2 10*3/uL (ref 1.8–7.0)
BASOPHILS %: 1 % (ref 0–2)
EOSINOPHILS %: 4 % (ref 0–5)
HEMATOCRIT: 40 % (ref 40–50)
HEMOGLOBIN: 13 g/dL (ref 13.5–16.5)
LYMPHOCYTES %: 32 % (ref 24–44)
MCH: 31 pg (ref 26–34)
MCHC: 33 g/dL (ref 32.0–36.0)
MCV: 93 FL — ABNORMAL HIGH (ref 80–100)
MONOCYTES %: 11 % (ref 4–12)
MPV: 6.3 FL — ABNORMAL LOW (ref 7–11)
NEUTROPHILS %: 52 % (ref 41–77)
PLATELET COUNT: 225 10*3/uL (ref 150–400)
RBC COUNT: 4.3 M/UL — ABNORMAL LOW (ref 4.4–5.5)
RDW: 13 % (ref >60)
WBC COUNT: 4 10*3/uL — ABNORMAL LOW (ref 4.5–11.0)

## 2023-07-25 LAB — LIVER FUNCTION PANEL: TOTAL BILIRUBIN: 0.4 mg/dL (ref 0.2–1.3)

## 2023-07-25 LAB — MICROALB/CR RATIO-URINE RANDOM: UR CREATININE, RAN: 20 mg/dL

## 2023-07-25 LAB — LIPID PROFILE
CHOLESTEROL: 175 mg/dL (ref <200)
LDL: 104 mg/dL — ABNORMAL HIGH (ref <100)
TRIGLYCERIDES: 62 mg/dL (ref <150)

## 2023-07-25 LAB — TSH WITH FREE T4 REFLEX: TSH: 2.2 uU/mL (ref 0.35–5.00)

## 2023-07-25 LAB — BASIC METABOLIC PANEL
POTASSIUM: 4.4 MMOL/L (ref 3.5–5.1)
SODIUM: 139 MMOL/L (ref 137–147)

## 2023-07-25 LAB — HEMOGLOBIN A1C: HEMOGLOBIN A1C: 5.6 % (ref >40)

## 2023-07-27 ENCOUNTER — Encounter: Admit: 2023-07-27 | Discharge: 2023-07-27 | Payer: MEDICARE

## 2023-07-27 DIAGNOSIS — I10 Essential (primary) hypertension: Secondary | ICD-10-CM

## 2023-07-27 DIAGNOSIS — K21 Gastroesophageal reflux disease with esophagitis without hemorrhage: Secondary | ICD-10-CM

## 2023-07-27 DIAGNOSIS — Z0000 Encounter for general adult medical examination without abnormal findings: Secondary | ICD-10-CM

## 2023-07-27 DIAGNOSIS — F5101 Primary insomnia: Secondary | ICD-10-CM

## 2023-07-27 DIAGNOSIS — E782 Mixed hyperlipidemia: Secondary | ICD-10-CM

## 2023-07-27 DIAGNOSIS — F419 Anxiety disorder, unspecified: Secondary | ICD-10-CM

## 2023-07-27 MED ORDER — ZOLPIDEM 5 MG PO TAB
5 mg | ORAL_TABLET | Freq: Every evening | ORAL | 0 refills | PRN
Start: 2023-07-27 — End: ?

## 2023-07-28 ENCOUNTER — Encounter: Admit: 2023-07-28 | Discharge: 2023-07-28 | Payer: MEDICARE

## 2023-07-28 ENCOUNTER — Ambulatory Visit: Admit: 2023-07-28 | Discharge: 2023-07-28 | Payer: BC Managed Care – PPO

## 2023-07-28 DIAGNOSIS — S42121A Displaced fracture of acromial process, right shoulder, initial encounter for closed fracture: Secondary | ICD-10-CM

## 2023-07-28 DIAGNOSIS — M2560 Stiffness of unspecified joint, not elsewhere classified: Secondary | ICD-10-CM

## 2023-07-28 DIAGNOSIS — M545 Chronic left-sided low back pain without sciatica: Secondary | ICD-10-CM

## 2023-07-28 DIAGNOSIS — R7301 Impaired fasting glucose: Secondary | ICD-10-CM

## 2023-07-28 DIAGNOSIS — M5136 Other intervertebral disc degeneration, lumbar region: Secondary | ICD-10-CM

## 2023-07-28 DIAGNOSIS — Z125 Encounter for screening for malignant neoplasm of prostate: Secondary | ICD-10-CM

## 2023-07-28 DIAGNOSIS — I10 Essential (primary) hypertension: Secondary | ICD-10-CM

## 2023-07-28 DIAGNOSIS — E782 Mixed hyperlipidemia: Secondary | ICD-10-CM

## 2023-07-28 DIAGNOSIS — R9430 Abnormal result of cardiovascular function study, unspecified: Secondary | ICD-10-CM

## 2023-07-28 DIAGNOSIS — M4800 Spinal stenosis, site unspecified: Secondary | ICD-10-CM

## 2023-07-28 DIAGNOSIS — G4730 Sleep apnea, unspecified: Secondary | ICD-10-CM

## 2023-07-28 DIAGNOSIS — F32A Depression: Secondary | ICD-10-CM

## 2023-07-28 DIAGNOSIS — M75120 Complete rotator cuff tear or rupture of unspecified shoulder, not specified as traumatic: Secondary | ICD-10-CM

## 2023-07-28 DIAGNOSIS — K219 Gastro-esophageal reflux disease without esophagitis: Secondary | ICD-10-CM

## 2023-07-28 DIAGNOSIS — IMO0002 Ulcer: Secondary | ICD-10-CM

## 2023-07-28 DIAGNOSIS — H269 Unspecified cataract: Secondary | ICD-10-CM

## 2023-07-28 DIAGNOSIS — H9190 Unspecified hearing loss, unspecified ear: Secondary | ICD-10-CM

## 2023-07-28 DIAGNOSIS — F419 Anxiety disorder, unspecified: Secondary | ICD-10-CM

## 2023-07-28 DIAGNOSIS — M2550 Pain in unspecified joint: Secondary | ICD-10-CM

## 2023-07-28 DIAGNOSIS — K21 Gastroesophageal reflux disease with esophagitis without hemorrhage: Secondary | ICD-10-CM

## 2023-07-28 DIAGNOSIS — D649 Anemia, unspecified: Secondary | ICD-10-CM

## 2023-07-28 DIAGNOSIS — E785 Hyperlipidemia, unspecified: Secondary | ICD-10-CM

## 2023-07-28 DIAGNOSIS — M1990 Unspecified osteoarthritis, unspecified site: Secondary | ICD-10-CM

## 2023-07-28 DIAGNOSIS — I2510 Atherosclerotic heart disease of native coronary artery without angina pectoris: Secondary | ICD-10-CM

## 2023-07-28 DIAGNOSIS — R519 Generalized headaches: Secondary | ICD-10-CM

## 2023-07-28 MED ORDER — KETOCONAZOLE-HYDROCORTISONE 2-2.5 % TP CREA
1 | Freq: Two times a day (BID) | TOPICAL | 1 refills | 50.00000 days | Status: DC
Start: 2023-07-28 — End: 2023-07-28

## 2023-07-28 MED ORDER — KETOCONAZOLE 2 % TP CREA
TOPICAL | 1 refills | 30.00000 days | Status: AC
Start: 2023-07-28 — End: ?

## 2023-07-28 NOTE — Patient Instructions
Get fasting labs few days before return to clinic in 4 months

## 2023-07-28 NOTE — Progress Notes
Answers submitted by the patient for this visit:  Other Symptom Questionnaire (Submitted on 01/20/2023)  Chief Complaint: Patient Reported Other  What topic(s) would you like to cover during your appointment?: Semi-Annual Check Up  Please describe the issue(s) and history with the issue (location, severity, duration, symptoms, etc.). : None  What has been done so far to take care of the issue(s)?: -  What are your goals for this visit?: -    Date of Service: 07/28/2023    Jared Hebert is a 72 y.o. male. DOB: 1950-12-24   MRN#: [id number]    Subjective: follow up       Patient Reported Other  What topic(s) would you like to cover during your appointment?:  Semi-Annual Check Up  Please describe the issue(s) and history with the issue (location, severity, duration, symptoms, etc.).:  None  What has been done so far to take care of the issue(s)?:  -  What are your goals for this visit?:  -    Jared Hebert is 71 y.o. patient who presents to clinic for follow up. He established care 07/19/2019. He is a retired Engineer, civil (consulting) in [employer]. No smoking, no alcohol, no street drugs. No regular exercise. He has 4 children.     He was last seen in our clinic by me 01/2023    He follows with spine center and saw them 04/11/22. He had a back injection 04/24/22  He had back surgery 11/24/22:   Anterior /Posterior Lumbar two -lumbar Five decompression and fusion with infuse and allograft. (Left)   He had an ortho follow up 12/31/22 and 02/2023 and 05/2023    He had Rt should er surgery 11/2020, 04/2021 and again in 04/2021 at [facility].     He saw sleep clinic 05/2021    He saw Cardiology 09/16/21 and 11/2021:  He had inconclusive coronary CT study and stress test so he ended with cardiac cath a/23/23 which showed no critical stenosis.     He had left arm and left chest wall shingles in 10/2021.     He had upset stomach recently similar to when he GI bleed before. No blood in stool. He had a bleeding ulcer before so I advised him to talk to his outside GI doctor to get another EGD. His Hb dropped from 13.7 on 09/16/21 to 12.6 on 01/10/22   He had a normal repeat EGD 02/28/22 ( copied sent for scanning)    He was in clinic for low back pain for 6 weeks as above. 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   He was referred by cardiology to GI because of anemia. He did EGD and colonscopy 01/25/2020 and was found to have a non-bleeding gastric ulcer 15mm that was biopsied and is scheduled for repeat EGD in 2 months. Colonscopy was normal. Results of scopes are sent for scanning. I told him to stop all  NSAID and Mobic at this time. He had a repeat EGD 05/07/2020 by his outside GI doctor and was reported that gastric ulcer has healed.      His calcium score was 315. He saw cardiology 12/2019. He had CTA chest which was normal but There was incidental cholelithiasis and a right renal lesion that will require ultrasound for further definition.     He had a normal stress test 11/09/2020  He failed oral iron so cardiology ordered IV Iron.     He had labs  drawn 07/25/23    He had abdomen US 01/30/2020 which showed:  Small hypoechoic exophytic right renal nodule most compatible with a   hemorrhagic cyst   Cholelithiasis   Mild bladder wall thickening, nonspecific but most likely secondary to   bladder outlet stenosis or neurogenic bladder     He managed to lose intentionally 55 Lb over last 1 year by having ketogenic diet. He takes multivitamin daily.    He also struggles with depression but no intent to harm himself or others. His brother had depression and committed suicide in 2003.       Past Medical History:   Diagnosis Date    Abnormal cardiac function test 01/13/2022    Anemia, unspecified 01/21/2023    Arthritis     back & shoulders    Cataract 5 Years ago    Coronary artery disease     Degenerative disc disease, lumbar     Depression 20+ Yrs    Displaced fracture of acromial process, right shoulder, initial encounter for closed fracture 11/26/2022    Generalized headaches Most of adult life    GERD (gastroesophageal reflux disease)     Hearing loss     Hyperlipidemia     Hypertension     Joint pain 10 yrs ago    Joint stiffness 2009    Nontraumatic complete tear of rotator cuff 11/26/2022    Sleep apnea     Spinal stenosis     Ulcer 12/2019    gastric       Surgical History:   Procedure Laterality Date    VASECTOMY  1984    HX CATARACT REMOVAL Bilateral 02/2017    OS Toric    PARS PLANA MECHANICAL VITRECTOMY WITH  Kenolog Right 09/02/2018    Performed by Blair Hailey, MD at [facility] OR    PARS PLANA MECHANICAL VITRECTOMY Left 11/04/2018    Performed by Blair Hailey, MD at [facility] OR    ANGIOGRAPHY CORONARY ARTERY WITH LEFT HEART CATHETERIZATION N/A 01/13/2022    Performed by Marcell Barlow, MD at [facility] CATH LAB    POSSIBLE PERCUTANEOUS CORONARY STENT PLACEMENT WITH ANGIOPLASTY N/A 01/13/2022    Performed by Marcell Barlow, MD at [facility] CATH LAB    Anterior /Posterior Lumbar two -lumbar Five decompression and fusion with infuse and allograft. Left 11/24/2022    Performed by Criss Rosales, MD at [facility] OR    ABDOMEN SURGERY  11/2022    Lumbar Fusion    HX ADENOIDECTOMY      HX JOINT REPLACEMENT  11/2020    Right Shoulder    HX TONSILLECTOMY      KNEE SURGERY  Left Knee Scope 4 yrs ago    NASAL SURGERY      TWICE    SHOULDER ARTHROPLASTY Right 04/2021    Right Shoulder - Injury    UMBILICAL ARTERIAL CATH - BEDSIDE  Less than a year    No Stents Placed       family history includes Arthritis in his father and mother; Back pain in his brother, father, and mother; Cataract in his father and mother; Depression in his brother; Heart Disease in his father; Heart problem in his father; Hypertension in his father; Joint Pain in his brother, father, and mother; Osteoporosis in his mother.    Social History     Socioeconomic History    Marital status: Married     Spouse name: Jared Hebert   Tobacco Use  Smoking status: Former     Current packs/day: 0.00     Average packs/day: 1 pack/day for 39.4 years (39.4 ttl pk-yrs)     Types: Cigarettes, Pipe, Cigars     Start date: 08/04/1967     Quit date: 12/22/2006     Years since quitting: 16.6    Smokeless tobacco: Former     Types: Chew     Quit date: 12/22/2006    Tobacco comments:     15 + yrs since last smoked   Vaping Use    Vaping status: Never Used   Substance and Sexual Activity    Alcohol use: Yes     Alcohol/week: 7.0 standard drinks of alcohol     Types: 7 Cans of beer per week     Comment: 1 Beer each night prior to going to bed    Drug use: Never    Sexual activity: Yes     Partners: Female     Birth control/protection: Surgical       Social History     Tobacco Use    Smoking status: Former     Current packs/day: 0.00     Average packs/day: 1 pack/day for 39.4 years (39.4 ttl pk-yrs)     Types: Cigarettes, Pipe, Cigars     Start date: 08/04/1967     Quit date: 12/22/2006     Years since quitting: 16.6    Smokeless tobacco: Former     Types: Chew     Quit date: 12/22/2006    Tobacco comments:     15 + yrs since last smoked   Substance Use Topics    Alcohol use: Yes     Alcohol/week: 7.0 standard drinks of alcohol     Types: 7 Cans of beer per week     Comment: 1 Beer each night prior to going to bed        Review of Systems   Constitutional: Negative for chills, diaphoresis, fever, malaise/fatigue, night sweats, weight gain and weight loss.   HENT:  Negative for congestion, sore throat and stridor.    Eyes:  Negative for blurred vision.   Cardiovascular:  Negative for chest pain, orthopnea, palpitations and syncope.   Respiratory:  Negative for cough, shortness of breath, snoring and wheezing.    Skin:  Negative for rash.   Musculoskeletal:  Negative for back pain, joint pain, joint swelling, myalgias and neck pain.   Gastrointestinal:  Negative for abdominal pain, anorexia, change in bowel habit, diarrhea, nausea and vomiting.   Genitourinary:  Negative for dysuria, flank pain and hesitancy.   Neurological:  Negative for disturbances in coordination, excessive daytime sleepiness, dizziness, focal weakness, headaches, numbness, seizures, vertigo and weakness.   Psychiatric/Behavioral:  Negative for depression, hallucinations and memory loss. The patient is not nervous/anxious.    Allergic/Immunologic: Negative for hives.   All other systems reviewed and are negative.      Objective:      ascorbic acid (VITAMIN C) 500 mg tablet Take one tablet by mouth daily.    cetirizine (ZYRTEC) 10 mg tablet Take one tablet by mouth every morning.    citalopram (CELEXA) 40 mg tablet Take one tablet by mouth daily.    cyclobenzaprine (FLEXERIL) 5 mg tablet Take one tablet to two tablets by mouth daily as needed.    ezetimibe (ZETIA) 10 mg tablet TAKE 1 TABLET BY MOUTH EVERY DAY IN THE MORNING    fluticasone propionate (FLONASE) 50 mcg/actuation nasal spray, suspension Apply  one spray to each nostril as directed at bedtime daily. Shake bottle gently before using.    folic acid (FOLVITE) 1 mg tablet TAKE 1 TABLET BY MOUTH EVERY DAY (Patient taking differently: Take one tablet by mouth every morning.)    gabapentin (NEURONTIN) 300 mg capsule TAKE 1 CAPSULE BY MOUTH EVERY 8 HOURS    GLUCOSAMINE SULFATE PO Take 1,500 mg by mouth every morning.    ketoconazole-hydrocortisone 2-2.5 % crea Apply 1 Application topically to affected area twice daily. Apply to affected area twice daily for 14 days    losartan (COZAAR) 25 mg tablet TAKE 1 TABLET BY MOUTH DAILY    meloxicam (MOBIC) 15 mg tablet Take one tablet by mouth every morning.    omeprazole DR (PRILOSEC) 40 mg capsule Take one capsule by mouth daily before breakfast.    rosuvastatin (CRESTOR) 20 mg tablet Take one tablet by mouth daily.    zolpidem (AMBIEN) 5 mg tablet TAKE 1 TABLET BY MOUTH AT BEDTIME AS NEEDED FOR SLEEP     Vitals:    07/28/23 0944   BP: 137/77   BP Source: Arm, Right Upper   Pulse: 67   Resp: 16   SpO2: 99%   PainSc: Zero   Weight: 83.3 kg (183 lb 9.6 oz)   Height: 172.7 cm (5' 8) Body mass index is 27.92 kg/m?Marland Kitchen     Physical Exam  Vitals and nursing note reviewed.   Constitutional:       General: He is not in acute distress.     Appearance: He is well-developed. He is not diaphoretic.   HENT:      Head: Normocephalic and atraumatic.      Nose: Nose normal.   Eyes:      General: No scleral icterus.        Right eye: No discharge.         Left eye: No discharge.      Conjunctiva/sclera: Conjunctivae normal.      Pupils: Pupils are equal, round, and reactive to light.   Neck:      Thyroid: No thyromegaly.      Vascular: No JVD.      Trachea: No tracheal deviation.   Cardiovascular:      Rate and Rhythm: Normal rate and regular rhythm.      Heart sounds: Normal heart sounds. No murmur heard.     No friction rub.   Pulmonary:      Effort: Pulmonary effort is normal. No respiratory distress.      Breath sounds: Normal breath sounds. No wheezing or rales.   Chest:      Chest wall: No tenderness.   Abdominal:      General: There is no distension.      Palpations: Abdomen is soft. There is no mass.      Tenderness: There is no abdominal tenderness. There is no guarding or rebound.   Musculoskeletal:         General: Normal range of motion.      Cervical back: Normal range of motion and neck supple.   Lymphadenopathy:      Cervical: No cervical adenopathy.   Skin:     General: Skin is warm and dry.   Neurological:      Mental Status: He is alert and oriented to person, place, and time.      Cranial Nerves: No cranial nerve deficit.   Psychiatric:         Mood and Affect: Mood  normal.         Behavior: Behavior normal.         Thought Content: Thought content normal.         Judgment: Judgment normal.          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 (Left)   He had an ortho follow up 12/31/22 and 03/2023 and 05/2023  Stable  Controlled    Rt shoulder injury at work:  He had surgery 11/2020, and 04/2021  Stable    Gastric ulcer:  He is in clinic for low back pain for 6 weeks as above. 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   He was referred by cardiology to GI because of anemia. He did EGD and colonscopy 01/25/2020 and was found to have a non-bleeding gastric ulcer 15mm that was biopsied and is scheduled for repeat EGD in 2 months. Colonscopy was normal. Results of scopes are sent for scanning. I told him to stop all  NSAID and Mobic at this time  He failed oral iron so cardiology ordered IV Iron.   He is scheduled to repeat EGD 05/07/20. I told him to restart Mobic with food if EGD is back to normal.   He had a repeat EGD 05/07/2020 by his outside GI doctor and was reported that gastric ulcer has healed.    Stable. Avoid NSAID  He had upset stomach recently similar to when he GI bleed before. No blood in stool. He had a bleeding ulcer before so I advised him to talk to his outside GI doctor to get another EGD. His Hb dropped from 13.7 on 09/16/21 to 12.6 on 01/10/22   He had a normal repeat EGD 02/28/22 ( copied sent for scanning)  CBC w diff    Lab Results   Component Value Date/Time    WBC 4.0 (L) 07/25/2023 10:55 AM    RBC 4.30 (L) 07/25/2023 10:55 AM    HGB 13.6 07/25/2023 10:55 AM    HCT 40.2 07/25/2023 10:55 AM    MCV 93.3 07/25/2023 10:55 AM    MCH 31.6 07/25/2023 10:55 AM    MCHC 33.9 07/25/2023 10:55 AM    RDW 13.2 07/25/2023 10:55 AM    PLTCT 225 07/25/2023 10:55 AM    MPV 6.3 (L) 07/25/2023 10:55 AM    Lab Results   Component Value Date/Time    NEUT 52 07/25/2023 10:55 AM    ANC 2.08 07/25/2023 10:55 AM    LYMA 32 07/25/2023 10:55 AM    ALC 1.29 07/25/2023 10:55 AM    MONA 11 07/25/2023 10:55 AM AMC 0.44 07/25/2023 10:55 AM    EOSA 4 07/25/2023 10:55 AM    AEC 0.16 07/25/2023 10:55 AM    BASA 1 07/25/2023 10:55 AM    ABC 0.02 07/25/2023 10:55 AM        Shingles:  He had left arm and left chest wall shingles in 10/2021.   Resolved    Hypertension:  He is  on HCTZ and Losarten  BP Readings from Last 3 Encounters:   07/28/23 137/77   06/05/23 126/71   03/20/23 122/88   continue  He had stress test and echo in 02/2016 that were reported as normal and I reviewed in care Every where  He saw cardiology and had a stress test 10/2021  Stable    Coronary calcification:  CT calcium scan 08/2019:  Quantitative Coronary Calcium Score:   Left main: 0   LAD: 114   LCX: 144   RCA: 5 7   Total calcium score: 315   He saw cardiology and had echo and normal stress test 10/2019 and has a follow up 12/2019 and 09/16/21  He saw Cardiology 09/16/21 and 11/2021:  He had inconclusive coronary CT study and stress test so he ended with cardiac cath a/23/23 which showed no critical stenosis.   Doing well    Renal mass:  His calcium score was 315. He saw cardiology 12/2019. He had CTA chest which was normal but There was incidental cholelithiasis and a right renal lesion that will require ultrasound for further definition.     He had a normal stress test 11/09/2020  He had abdomen US 01/30/2020 which showed:  Small hypoechoic exophytic right renal nodule most compatible with a   hemorrhagic cyst   Cholelithiasis   Mild bladder wall thickening, nonspecific but most likely secondary to   bladder outlet stenosis or neurogenic bladder   Stable    Hyperlipidemia:  He is on Zocor 20mg  daily but did not take for 30 days. He had cramps with Lipitor.  We increased Zocor to 40mg  daily 07/19/2019  Cardiology switched him to Crestor 10mg  daily and Zetia  Plan on repeat labs in 3 months  Lab Results   Component Value Date    CHOL 175 07/25/2023    TRIG 62 07/25/2023    HDL 65 07/25/2023    LDL 104 (H) 07/25/2023    VLDL 12 07/25/2023    NONHDLCHOL 110 07/25/2023      Anxiety and Depression:   He had for 20 years  He is on Effexor and Remeron  He is not feeling that this combination is helping  He is homicidal or suicidal but his brother committed suicide in 2013  We got him to taper off his medications over2 weeks and we he started Celexa 20mg  daily after he tapered off  We referred to psychologist  I am referring this patient to the behavior health consultant for the following conditions:  Health Risk Behaviors:  Stress management  Mental Health Conditions:  Depression and Anxiety  We increased Celexa from 20 to 40mg  daily back in 08/2019  He is doing better    History of sleep apnea:  He is not snoring after he lost weight  He struggles with staying sleep and Ambien did not help  He saw sleep clinic 05/2021  Refill    Weakness:  Normal labs and TSH and B12 level   He is on ketogenic diet    Hearing deficits:  He has hearing aids    Skin lesion on back:  Mild redness  I will start a topical antifungal/steroid cream    Routine health maintenance:    Colonoscopy: 2014, 2021, normal  Tdap: 2015  Pneumovax: Prevnar 07/19/2019 and plan on pneumovax in a year  Influenza:09/2018, 10/2019, 11/2021  Eye exam: 2019  Dental exam: 2019  AAA screening: 07/2019  He had COVID vaccine 10/2020, 01/20/22    Return  to clinic in 4 months with labs    Total of 40 minutes were spent on the same day of the visit including preparing to see the patient, obtaining and/or reviewing separately obtained history, performing a medically appropriate examination and/or evaluation, counseling and educating the patient/family/caregiver, ordering medications, tests, or procedures, referring and communication with other health care professionals, documenting clinical information in the electronic or other health record, independently interpreting results and communicating results to the patient/family/caregiver, and care coordination.   I Counseled patient regarding low back pain, depression and weakness. We discussed safety during COVID19 pandemic.     Orders Placed This Encounter    CBC AND DIFF    BASIC METABOLIC PANEL    HEMOGLOBIN A1C    LIPID PROFILE    LIVER FUNCTION PANEL    TSH WITH FREE T4 REFLEX    PSA SCREEN today    ketoconazole-hydrocortisone 2-2.5 % crea       Encounter Medications   Medications    ketoconazole-hydrocortisone 2-2.5 % crea     Sig: Apply 1 Application topically to affected area twice daily. Apply to affected area twice daily for 14 days     Dispense:  30 g     Refill:  1     Future Appointments   Date Time Provider Department Center   11/12/2023  8:00 AM Jana Half, MD [facility] Exam   12/04/2023  8:15 AM Criss Rosales, MD [facility]     Patient Instructions   Get fasting labs few days before return to clinic in 4 months

## 2023-07-28 NOTE — Telephone Encounter
Requested Prescriptions   Pending Prescriptions Disp Refills    ketoconazole (NIZORAL) 2 % topical cream [Pharmacy Med Name: KETOCONAZOLE 2% CREAM] 30 g 1     Sig: APPLY TO AFFECTED AREA TWICE DAILY FOR 14 DAYS       Off-Protocol Failed - 07/28/2023 10:20 AM        Failed - Medication not assigned to a protocol, review manually.        Passed - Valid encounter within last 12 months     Recent Visits  Date Type Provider Dept   01/27/23 Office Visit Al-Hihi, Roderic Scarce, MD [facility]   05/27/22 Office Visit Al-Hihi, Roderic Scarce, MD [facility]   01/20/22 Office Visit Al-Hihi, Roderic Scarce, MD [facility]   09/16/21 Office Visit Al-Hihi, Roderic Scarce, MD [facility]   Showing recent visits within past 720 days and meeting all other requirements  Today's Visits  Date Type Provider Dept   07/28/23 Office Visit Al-Hihi, Roderic Scarce, MD [facility]   Showing today's visits and meeting all other requirements  Future Appointments  No visits were found meeting these conditions.  Showing future appointments within next 360 days and meeting all other requirements

## 2023-08-26 ENCOUNTER — Encounter: Admit: 2023-08-26 | Discharge: 2023-08-26 | Payer: MEDICARE

## 2023-08-29 ENCOUNTER — Encounter: Admit: 2023-08-29 | Discharge: 2023-08-29 | Payer: MEDICARE

## 2023-08-29 DIAGNOSIS — I10 Essential (primary) hypertension: Secondary | ICD-10-CM

## 2023-08-29 DIAGNOSIS — E782 Mixed hyperlipidemia: Secondary | ICD-10-CM

## 2023-08-29 DIAGNOSIS — F419 Anxiety disorder, unspecified: Secondary | ICD-10-CM

## 2023-08-29 DIAGNOSIS — F5101 Primary insomnia: Secondary | ICD-10-CM

## 2023-08-29 DIAGNOSIS — K21 Gastroesophageal reflux disease with esophagitis without hemorrhage: Secondary | ICD-10-CM

## 2023-08-29 DIAGNOSIS — Z0000 Encounter for general adult medical examination without abnormal findings: Secondary | ICD-10-CM

## 2023-08-29 MED ORDER — ZOLPIDEM 5 MG PO TAB
5 mg | ORAL_TABLET | Freq: Every evening | ORAL | 0 refills | PRN
Start: 2023-08-29 — End: ?

## 2023-09-27 IMAGING — CR SHOULDCMRT
3 series · 3 of 3 positions shown · non-contrast
Comparison: none

[shoulder external]
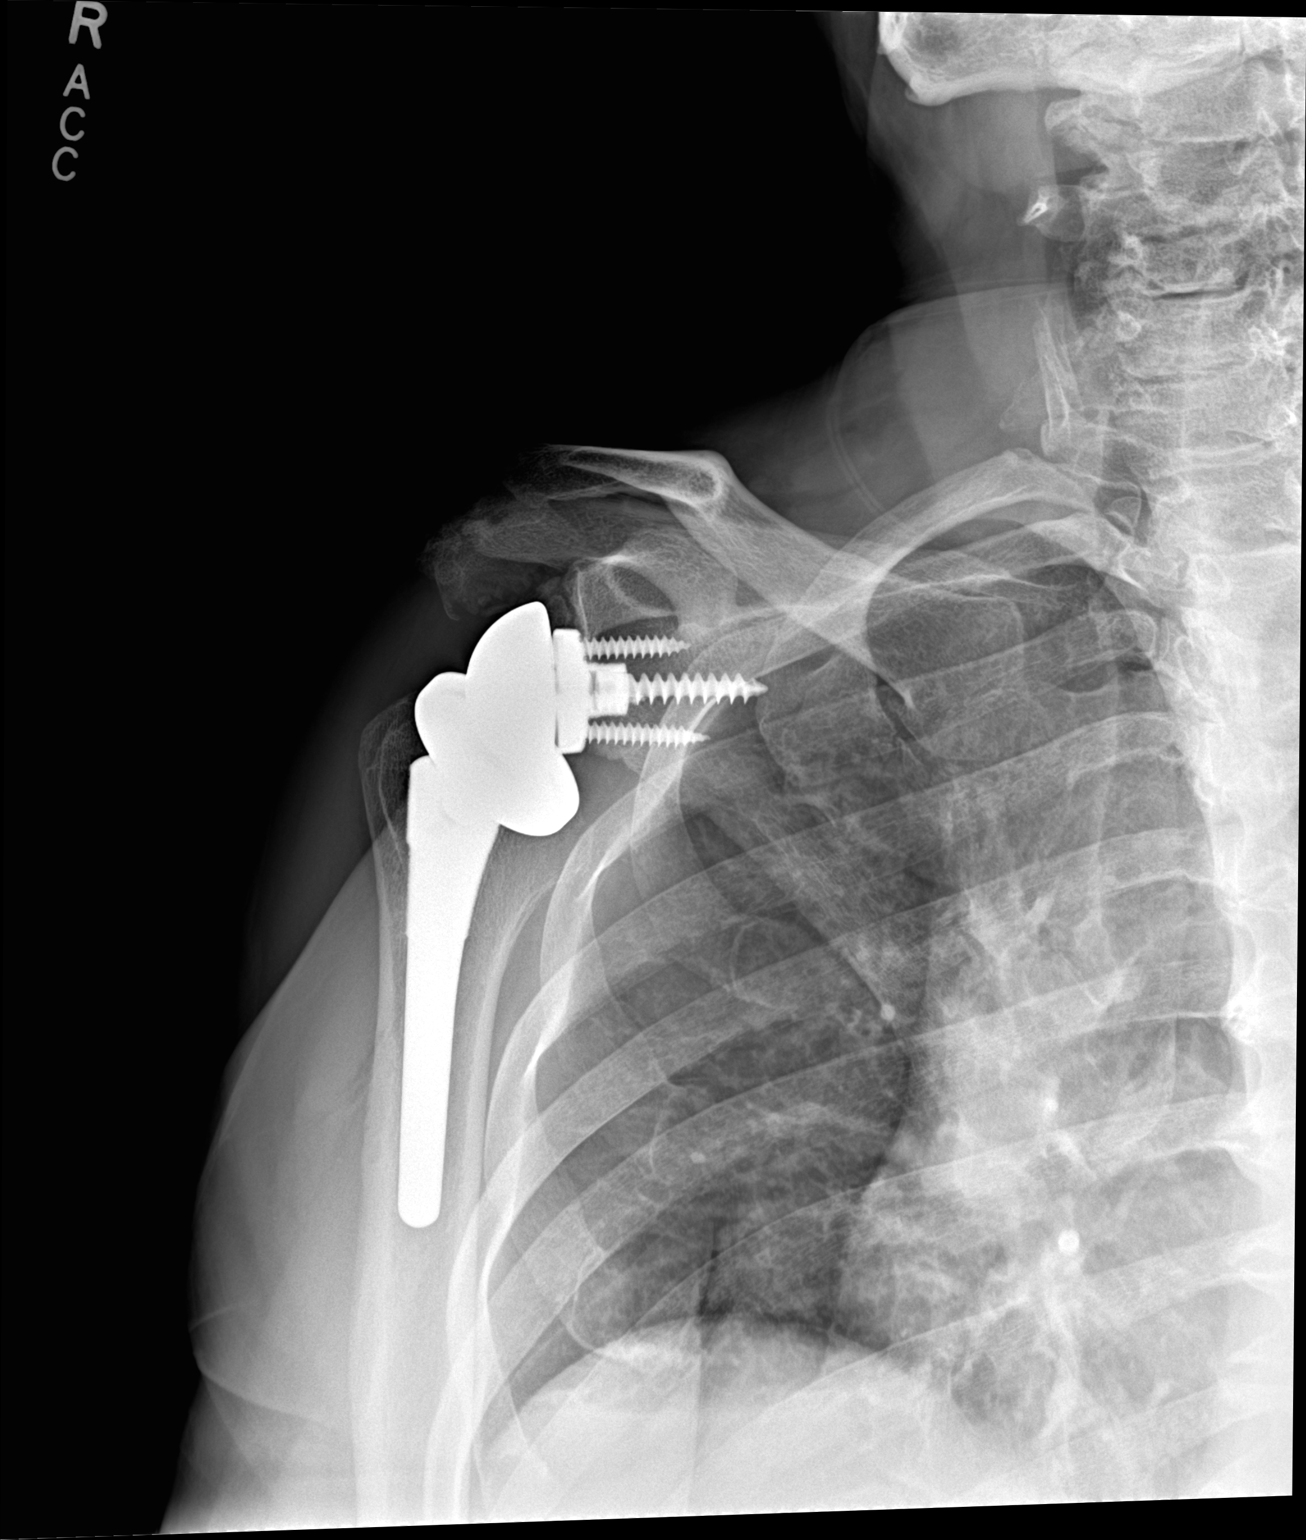

[shoulder y-view]
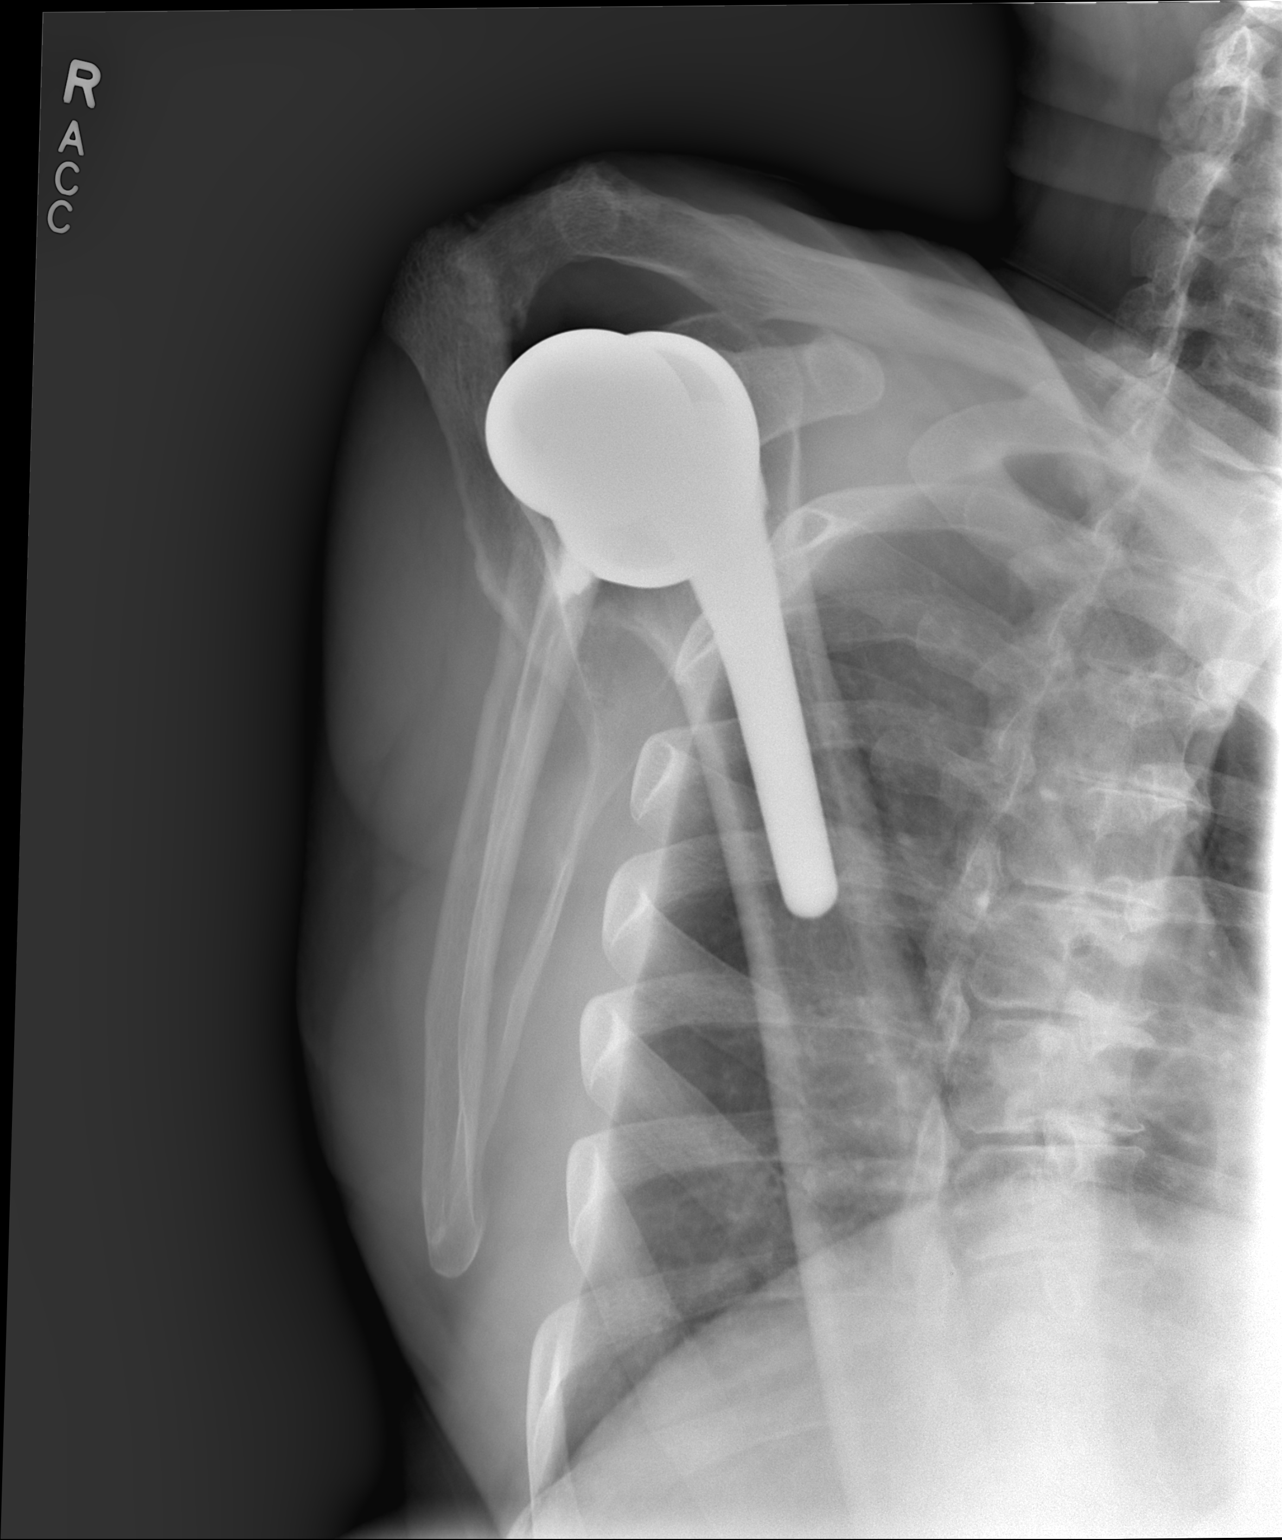

[shoulder axillary]
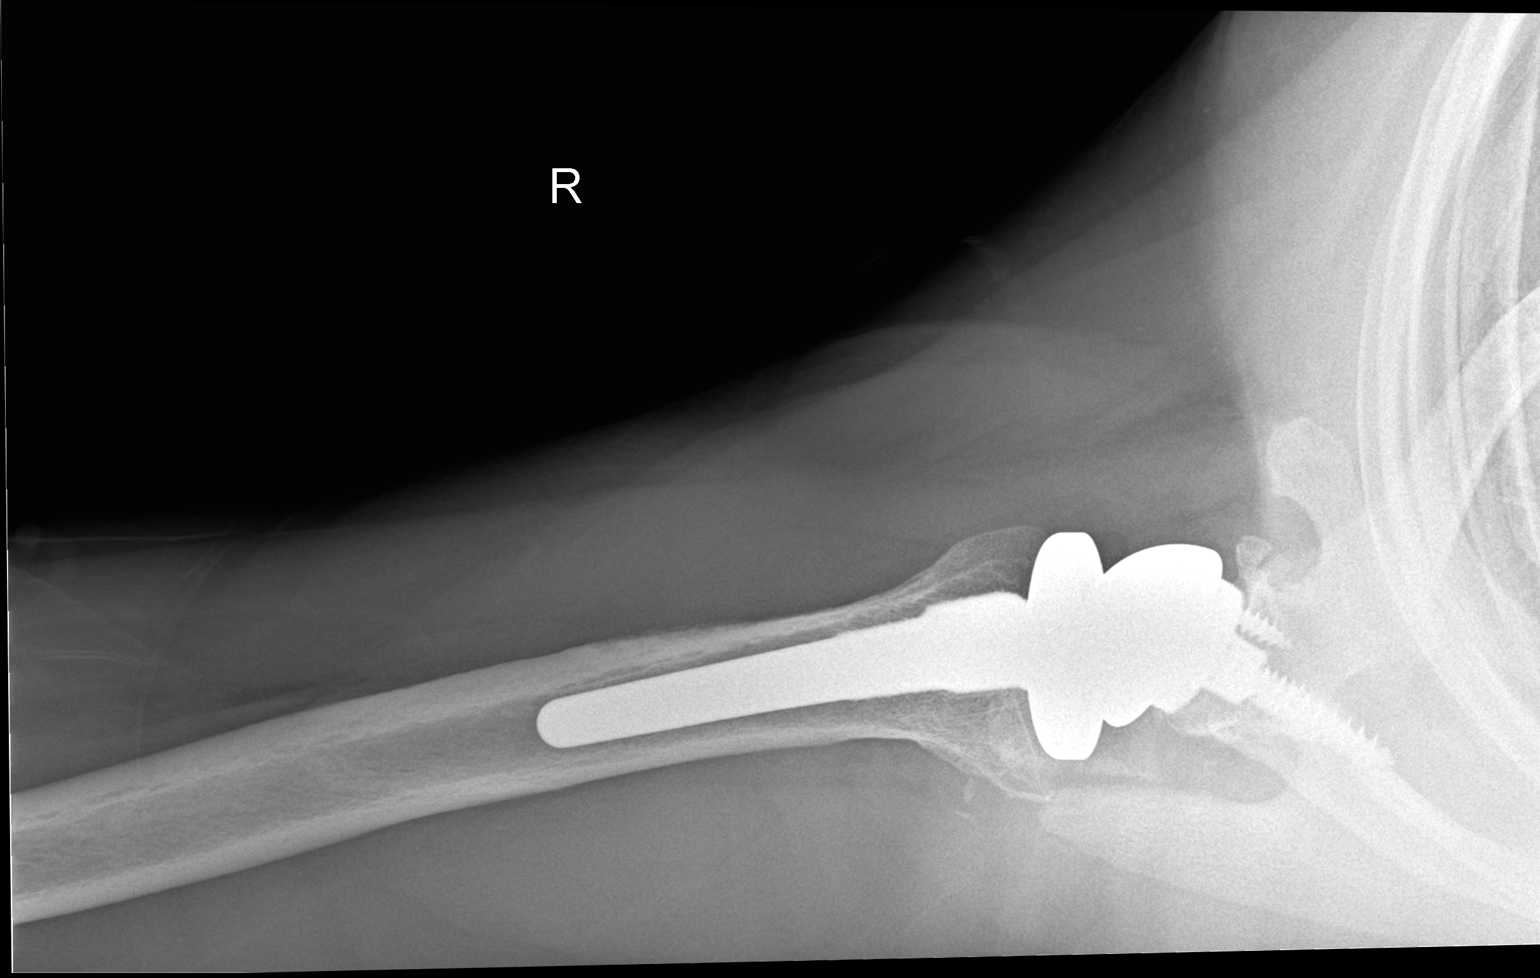

[3 of 3 positions shown; findings below may reference images not displayed]

01/08/22

DIAGNOSTIC STUDIES

EXAM

XR shoulder right, complete

INDICATION

Right Reverse Total Shoulder Replacement
Right Reverse Total Shoulder Replacement.

FINDINGS

Right shoulder arthroplasty. The hardware appears intact.

IMPRESSION

Right shoulder arthroplasty.

Tech Notes:

Right Reverse Total Shoulder Replacement.

## 2023-09-28 ENCOUNTER — Encounter: Admit: 2023-09-28 | Discharge: 2023-09-28 | Payer: MEDICARE

## 2023-09-28 DIAGNOSIS — K21 Gastroesophageal reflux disease with esophagitis without hemorrhage: Secondary | ICD-10-CM

## 2023-09-28 DIAGNOSIS — I10 Essential (primary) hypertension: Secondary | ICD-10-CM

## 2023-09-28 DIAGNOSIS — F5101 Primary insomnia: Secondary | ICD-10-CM

## 2023-09-28 DIAGNOSIS — F419 Anxiety disorder, unspecified: Secondary | ICD-10-CM

## 2023-09-28 DIAGNOSIS — E782 Mixed hyperlipidemia: Secondary | ICD-10-CM

## 2023-09-28 DIAGNOSIS — Z0000 Encounter for general adult medical examination without abnormal findings: Secondary | ICD-10-CM

## 2023-09-28 MED ORDER — ZOLPIDEM 5 MG PO TAB
5 mg | ORAL_TABLET | Freq: Every evening | ORAL | 0 refills | PRN
Start: 2023-09-28 — End: ?

## 2023-09-29 NOTE — Telephone Encounter
Some refill protocol elements NOT Met  Medication name: Ambien  Medication Strength: 5mg  tablet      Off protocol and Manual message review   Patient is < 70 years old. Beers Criteria: Use with caution in patients 65+    This refill cannot be delegated     Next office visit: 12/01/23    Routed to Provider      Shelly Bombard, RN

## 2023-10-19 ENCOUNTER — Encounter: Admit: 2023-10-19 | Discharge: 2023-10-19 | Payer: MEDICARE

## 2023-11-11 ENCOUNTER — Encounter: Admit: 2023-11-11 | Discharge: 2023-11-11 | Payer: MEDICARE

## 2023-11-12 ENCOUNTER — Encounter: Admit: 2023-11-12 | Discharge: 2023-11-12 | Payer: MEDICARE

## 2023-11-12 ENCOUNTER — Ambulatory Visit: Admit: 2023-11-12 | Discharge: 2023-11-13 | Payer: BC Managed Care – PPO

## 2023-11-12 DIAGNOSIS — I2510 Atherosclerotic heart disease of native coronary artery without angina pectoris: Secondary | ICD-10-CM

## 2023-11-12 DIAGNOSIS — I10 Essential (primary) hypertension: Secondary | ICD-10-CM

## 2023-11-12 MED ORDER — LOSARTAN 50 MG PO TAB
50 mg | ORAL_TABLET | Freq: Every day | ORAL | 3 refills | 90.00000 days | Status: AC
Start: 2023-11-12 — End: ?

## 2023-11-12 NOTE — Patient Instructions
I have adjusted losartan 25-->50mg  daily    Please have cholesterol checked on day of appt with Dr Aldine Contes will see you back in 6 months.  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  There is a shared phone number for this group of nurses, [phone].  Please contact this number should you have any questions about your visit today or concerns about upcoming appointments/procedures.  We would like to know if you are having any worrisome cardiac symptoms.     Please call [phone] if needing to schedule an office visit.     We are also available by e-mail through the MyChart system if you have non-urgent concerns. We will get back to you as soon as possible.

## 2023-11-13 DIAGNOSIS — E782 Mixed hyperlipidemia: Secondary | ICD-10-CM

## 2023-11-16 ENCOUNTER — Encounter: Admit: 2023-11-16 | Discharge: 2023-11-16 | Payer: MEDICARE

## 2023-11-16 DIAGNOSIS — K21 Gastroesophageal reflux disease with esophagitis without hemorrhage: Secondary | ICD-10-CM

## 2023-11-16 DIAGNOSIS — F419 Anxiety disorder, unspecified: Secondary | ICD-10-CM

## 2023-11-16 DIAGNOSIS — Z981 Arthrodesis status: Secondary | ICD-10-CM

## 2023-11-16 DIAGNOSIS — I10 Essential (primary) hypertension: Secondary | ICD-10-CM

## 2023-11-16 DIAGNOSIS — Z09 Encounter for follow-up examination after completed treatment for conditions other than malignant neoplasm: Secondary | ICD-10-CM

## 2023-11-16 DIAGNOSIS — E782 Mixed hyperlipidemia: Secondary | ICD-10-CM

## 2023-11-16 MED ORDER — ROSUVASTATIN 20 MG PO TAB
20 mg | ORAL_TABLET | Freq: Every day | ORAL | 3 refills | 90.00000 days | Status: AC
Start: 2023-11-16 — End: ?

## 2023-11-16 NOTE — Telephone Encounter
1. Received e-request from pharmacy requesting new Rx for rosuvastatin.    2. Last Rx written:      Disp Refills Start End     rosuvastatin (CRESTOR) 20 mg tablet 90 tablet 3 01/27/2023 --    Sig - Route: Take one tablet by mouth daily. - Oral    Sent to pharmacy as: rosuvastatin 20 mg tablet (CRESTOR)    E-Prescribing Status: Receipt confirmed by pharmacy (01/27/2023 10:42 AM CST)        3. Last Gen Med Visit:    07/28/23    4. Next Appt due:    12/01/23    Future Appointments   Date Time Provider Department Center   12/01/2023 10:40 AM Al-Hihi, Roderic Scarce, MD [facility]   12/04/2023  8:15 AM Criss Rosales, MD [facility]   05/19/2024  8:30 AM Kathie Rhodes, PA-C [facility] Exam       5. All protocol criteria met? Yes

## 2023-11-16 NOTE — Patient Instructions
 It was a pleasure seeing you in clinic today.    Oretha Milch BSN, RN  Clinical Nurse Coordinator  Dr. Ivin Booty T. Bunch  [facility]  [address], [address]  [place], [place] [address]  Phone: [phone]  Fax [phone]   Scheduling [phone]  [email or web address]    We appreciate the interest in your health.  The physician reviews both the radiology reports and the imaging independently and will contact you if there are any emergent findings.  If you would like, incidental and chronic changes seen in your images can be reviewed with Dr. Liam Rogers at a follow up appointment.  At that appointment, the radiology report and terminology used in that report can also be explained. Please call our Schedule line if you would like to follow up.

## 2023-11-24 ENCOUNTER — Encounter: Admit: 2023-11-24 | Discharge: 2023-11-24 | Payer: MEDICARE

## 2023-11-24 NOTE — Progress Notes
Pt hospitalized since last office visit: Yes    Health Maintenance Due   Topic Date Due    MEDICARE ANNUAL WELLNESS VISIT  Never done    ADVANCE CARE PLANNING DISCUSSION AND DOCUMENTATION  Never done    HEPATITIS C SCREENING  Never done    SHINGLES RECOMBINANT VACCINE (1 of 2) Never done    RSV VACCINE (60 YEARS AND OLDER) (1 - Risk 60-74 years 1-dose series) Never done    INFLUENZA VACCINE (1) 07/23/2023    COVID-19 VACCINE (6 - 2024-25 season) 08/23/2023       There are no diagnoses linked to this encounter.    Labs not done:      Lab Frequency Next Occurrence   REQUEST FOR CARDIOLOGY APPOINTMENT Once 07/10/2022   REQUEST FOR CARDIOLOGY APPOINTMENT Once 09/16/2023   CBC AND DIFF Once 11/27/2023   BASIC METABOLIC PANEL Once 11/27/2023   HEMOGLOBIN A1C Once 11/27/2023   LIPID PROFILE Once 11/27/2023   LIVER FUNCTION PANEL Once 11/27/2023   TSH WITH FREE T4 REFLEX Once 11/27/2023   PSA SCREEN Once 11/27/2023   REQUEST FOR CARDIOLOGY APPOINTMENT Once 05/11/2024   LIPID PROFILE Once 11/19/2023   REQUEST FOR CARDIOLOGY APPOINTMENT Once 11/11/2024   SCOLIOSIS EOS AP/LAT Once 12/04/2023       MyChart message sent to patient on 11/24/23 Gibson Ramp, RN     Notes to provider:    Answers submitted by the patient for this visit:  Other Symptom Questionnaire (Submitted on 11/27/2023)  Chief Complaint: Patient Reported Other  What topic(s) would you like to cover during your appointment?: Bi-yearly check up.  Please describe the issue(s) and history with the issue (location, severity, duration, symptoms, etc.). : *  What has been done so far to take care of the issue(s)?: *  What are your goals for this visit?: *

## 2023-12-01 ENCOUNTER — Encounter: Admit: 2023-12-01 | Discharge: 2023-12-01 | Payer: MEDICARE

## 2023-12-01 ENCOUNTER — Ambulatory Visit: Admit: 2023-12-01 | Discharge: 2023-12-02 | Payer: BC Managed Care – PPO

## 2023-12-01 ENCOUNTER — Ambulatory Visit: Admit: 2023-12-01 | Discharge: 2023-12-01 | Payer: MEDICARE

## 2023-12-01 DIAGNOSIS — F5101 Primary insomnia: Secondary | ICD-10-CM

## 2023-12-01 DIAGNOSIS — F419 Anxiety disorder, unspecified: Secondary | ICD-10-CM

## 2023-12-01 DIAGNOSIS — Z1159 Encounter for screening for other viral diseases: Secondary | ICD-10-CM

## 2023-12-01 DIAGNOSIS — E782 Mixed hyperlipidemia: Secondary | ICD-10-CM

## 2023-12-01 DIAGNOSIS — M545 Chronic left-sided low back pain without sciatica: Secondary | ICD-10-CM

## 2023-12-01 DIAGNOSIS — I10 Essential (primary) hypertension: Secondary | ICD-10-CM

## 2023-12-01 NOTE — Progress Notes
Answers submitted by the patient for this visit:  Other Symptom Questionnaire (Submitted on 11/27/2023)  Chief Complaint: Patient Reported Other  What topic(s) would you like to cover during your appointment?: Bi-yearly check up.  Please describe the issue(s) and history with the issue (location, severity, duration, symptoms, etc.). : *  What has been done so far to take care of the issue(s)?: *  What are your goals for this visit?: *    Date of Service: 12/01/2023    Jared Hebert is a 72 y.o. male. DOB: Jan 12, 1951   MRN#: [id number]    Subjective: follow up       Patient Reported Other  What topic(s) would you like to cover during your appointment?:  Semi-Annual Check Up  Please describe the issue(s) and history with the issue (location, severity, duration, symptoms, etc.).:  None  What has been done so far to take care of the issue(s)?:  -  What are your goals for this visit?:  -    Jared Hebert is 72 y.o. patient who presents to clinic for follow up. He established care 07/19/2019. He is a retired Engineer, civil (consulting) in [employer]. No smoking, no alcohol, no street drugs. No regular exercise. He has 4 children.     He was last seen in our clinic by me 07/2023    He had a fall from a roof around 14 feet in 08/2023 and ended with three fractured thoracic vertebra. He was admitted to [facility] and had surgery at [facility] and they infused from T9-T12. He is doing well.     He follows with spine center and saw them 04/11/22. He had a back injection 04/24/22  He had back surgery 11/24/22:   Anterior /Posterior Lumbar two -lumbar Five decompression and fusion with infuse and allograft. (Left)   He had an ortho follow up 12/31/22 and 02/2023 and 05/2023    He had Rt should er surgery 11/2020, 04/2021 and again in 04/2021 at [facility]     He saw sleep clinic 05/2021    He saw Cardiology 09/16/21 and 11/2021:  He had inconclusive coronary CT study and stress test so he ended with cardiac cath a/23/23 which showed no critical stenosis. He saw cardiology 11/12/23.     He had left arm and left chest wall shingles in 10/2021.     He had upset stomach recently similar to when he GI bleed before. No blood in stool. He had a bleeding ulcer before so I advised him to talk to his outside GI doctor to get another EGD. His Hb dropped from 13.7 on 09/16/21 to 12.6 on 01/10/22   He had a normal repeat EGD 02/28/22 ( copied sent for scanning)    He was in clinic for low back pain for 6 weeks as above. 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   He was referred by cardiology to GI because of anemia. He did EGD and colonscopy 01/25/2020 and was found to have a non-bleeding gastric ulcer 15mm that was biopsied and is scheduled for repeat EGD in 2 months. Colonscopy was normal. Results of scopes are sent for scanning. I told him to stop all  NSAID and Mobic at this time. He had a repeat EGD 05/07/2020 by his outside GI doctor and was reported that gastric ulcer has healed.      His calcium score was 315. He saw cardiology 12/2019. He had CTA chest which  was normal but There was incidental cholelithiasis and a right renal lesion that will require ultrasound for further definition.     He had a normal stress test 11/09/2020  He failed oral iron so cardiology ordered IV Iron.     He had labs drawn 12/01/23    He had abdomen US 01/30/2020 which showed:  Small hypoechoic exophytic right renal nodule most compatible with a   hemorrhagic cyst   Cholelithiasis   Mild bladder wall thickening, nonspecific but most likely secondary to   bladder outlet stenosis or neurogenic bladder     He managed to lose intentionally 55 Lb over last 1 year by having ketogenic diet. He takes multivitamin daily.    He also struggles with depression but no intent to harm himself or others. His brother had depression and committed suicide in 2003.       Past Medical History:   Diagnosis Date    Abnormal cardiac function test 01/13/2022    Anemia, unspecified 01/21/2023    Arthritis     back & shoulders    Cataract 5 Years ago    Coronary artery disease     Coronary atherosclerosis See chart    Degenerative disc disease, lumbar     Depression 20+ Yrs    Displaced fracture of acromial process, right shoulder, initial encounter for closed fracture 11/26/2022    Generalized headaches Most of adult life    GERD (gastroesophageal reflux disease)     Hearing loss     Hyperlipidemia     Hypertension     Hypertriglyceridemia     Joint pain 10 yrs ago    Joint stiffness 2009    Nontraumatic complete tear of rotator cuff 11/26/2022    Obesity     Sleep apnea     Spinal stenosis     Ulcer 12/2019    gastric       Surgical History:   Procedure Laterality Date    VASECTOMY  1984    HX CATARACT REMOVAL Bilateral 02/2017    OS Toric    PARS PLANA MECHANICAL VITRECTOMY WITH  Kenolog Right 09/02/2018    Performed by Blair Hailey, MD at [facility] OR    PARS PLANA MECHANICAL VITRECTOMY Left 11/04/2018    Performed by Blair Hailey, MD at [facility] OR    ANGIOGRAPHY CORONARY ARTERY WITH LEFT HEART CATHETERIZATION N/A 01/13/2022    Performed by Marcell Barlow, MD at [facility] CATH LAB    POSSIBLE PERCUTANEOUS CORONARY STENT PLACEMENT WITH ANGIOPLASTY N/A 01/13/2022    Performed by Marcell Barlow, MD at [facility] CATH LAB    Anterior /Posterior Lumbar two -lumbar Five decompression and fusion with infuse and allograft. Left 11/24/2022    Performed by Criss Rosales, MD at [facility] OR    ABDOMEN SURGERY  11/2022    Lumbar Fusion    CARDIAC CATHERIZATION      CARDIOVASCULAR STRESS TEST      ECHOCARDIOGRAM PROCEDURE      ELECTROCARDIOGRAM      HX ADENOIDECTOMY      HX JOINT REPLACEMENT  11/2020    Right Shoulder    HX TONSILLECTOMY      KNEE SURGERY  Left Knee Scope 4 yrs ago    NASAL SURGERY      TWICE    SHOULDER ARTHROPLASTY Right 04/2021    Right Shoulder - Injury    UMBILICAL ARTERIAL CATH - BEDSIDE  Less than a year    No  Stents Placed       family history includes Arthritis in his father and mother; Back pain in his brother, father, and mother; Cataract in his father and mother; Coronary Artery Disease in his father; Depression in his brother; Heart Disease in his father; Heart problem in his father; High Cholesterol in his father; Hypertension in his father; Joint Pain in his brother, father, and mother; Osteoporosis in his mother.    Social History     Socioeconomic History    Marital status: Married     Spouse name: Bonita Quin   Tobacco Use    Smoking status: Former     Current packs/day: 0.00     Average packs/day: 1 pack/day for 39.4 years (39.4 ttl pk-yrs)     Types: Cigarettes, Pipe, Cigars     Start date: 08/04/1967     Quit date: 12/22/2006     Years since quitting: 16.9    Smokeless tobacco: Former     Types: Chew     Quit date: 12/22/2006    Tobacco comments:     15 + yrs since last smoked   Vaping Use    Vaping status: Never Used   Substance and Sexual Activity    Alcohol use: Yes     Alcohol/week: 7.0 standard drinks of alcohol     Types: 7 Cans of beer per week     Comment: 1 Beer each night prior to going to bed    Drug use: Never    Sexual activity: Yes     Partners: Female     Birth control/protection: Surgical       Social History     Tobacco Use    Smoking status: Former     Current packs/day: 0.00     Average packs/day: 1 pack/day for 39.4 years (39.4 ttl pk-yrs)     Types: Cigarettes, Pipe, Cigars     Start date: 08/04/1967     Quit date: 12/22/2006     Years since quitting: 16.9    Smokeless tobacco: Former     Types: Chew     Quit date: 12/22/2006    Tobacco comments:     15 + yrs since last smoked   Substance Use Topics    Alcohol use: Yes     Alcohol/week: 7.0 standard drinks of alcohol     Types: 7 Cans of beer per week     Comment: 1 Beer each night prior to going to bed        Review of Systems   Constitutional: Negative for chills, diaphoresis, fever, malaise/fatigue, night sweats, weight gain and weight loss.   HENT:  Negative for congestion, sore throat and stridor.    Eyes:  Negative for blurred vision. Cardiovascular:  Negative for chest pain, orthopnea, palpitations and syncope.   Respiratory:  Negative for cough, shortness of breath, snoring and wheezing.    Skin:  Negative for rash.   Musculoskeletal:  Negative for back pain, joint pain, joint swelling, myalgias and neck pain.   Gastrointestinal:  Negative for abdominal pain, anorexia, change in bowel habit, diarrhea, nausea and vomiting.   Genitourinary:  Negative for dysuria, flank pain and hesitancy.   Neurological:  Negative for disturbances in coordination, excessive daytime sleepiness, dizziness, focal weakness, headaches, numbness, seizures, vertigo and weakness.   Psychiatric/Behavioral:  Negative for depression, hallucinations and memory loss. The patient is not nervous/anxious.    Allergic/Immunologic: Negative for hives.   All other systems reviewed and are negative.  Objective:      ascorbic acid (VITAMIN C) 500 mg tablet Take one tablet by mouth daily.    cetirizine (ZYRTEC) 10 mg tablet Take one tablet by mouth every morning.    citalopram (CELEXA) 20 mg tablet Take one tablet by mouth daily. Please adjust dose down to 20mg  daily as in the script. Please discontinue Omeprazole script.    cyclobenzaprine (FLEXERIL) 5 mg tablet Take one tablet to two tablets by mouth daily as needed.    ezetimibe (ZETIA) 10 mg tablet TAKE 1 TABLET BY MOUTH EVERY DAY IN THE MORNING    fluticasone propionate (FLONASE) 50 mcg/actuation nasal spray, suspension Apply one spray to each nostril as directed at bedtime daily. Shake bottle gently before using.    folic acid (FOLVITE) 1 mg tablet TAKE 1 TABLET BY MOUTH EVERY DAY    gabapentin (NEURONTIN) 300 mg capsule TAKE 1 CAPSULE BY MOUTH EVERY 8 HOURS    GLUCOSAMINE SULFATE PO Take 1,500 mg by mouth every morning.    ketoconazole (NIZORAL) 2 % topical cream APPLY TO AFFECTED AREA TWICE DAILY FOR 14 DAYS    losartan (COZAAR) 25 mg tablet Take one tablet by mouth daily.    meloxicam (MOBIC) 15 mg tablet Take one tablet by mouth every morning. (Patient not taking: Reported on 12/01/2023)    omeprazole DR (PRILOSEC) 40 mg capsule Take one capsule by mouth daily before breakfast.    rosuvastatin (CRESTOR) 20 mg tablet TAKE 1 TABLET BY MOUTH EVERY DAY    zolpidem (AMBIEN) 5 mg tablet TAKE 1 TABLET BY MOUTH AT BEDTIME AS NEEDED FOR SLEEP     Vitals:    12/01/23 1100   BP: 133/87   BP Source: Arm, Left Upper   Pulse: 63   Temp: 36.9 ?C (98.4 ?F)   Resp: 16   SpO2: 98%   TempSrc: Oral   PainSc: Two   Weight: 87.5 kg (192 lb 12.8 oz)   Height: 172.7 cm (5' 7.99)       Body mass index is 29.32 kg/m?Marland Kitchen     Physical Exam  Vitals and nursing note reviewed.   Constitutional:       General: He is not in acute distress.     Appearance: He is well-developed. He is not diaphoretic.   HENT:      Head: Normocephalic and atraumatic.      Nose: Nose normal.   Eyes:      General: No scleral icterus.        Right eye: No discharge.         Left eye: No discharge.      Conjunctiva/sclera: Conjunctivae normal.      Pupils: Pupils are equal, round, and reactive to light.   Neck:      Thyroid: No thyromegaly.      Vascular: No JVD.      Trachea: No tracheal deviation.   Cardiovascular:      Rate and Rhythm: Normal rate and regular rhythm.      Heart sounds: Normal heart sounds. No murmur heard.     No friction rub.   Pulmonary:      Effort: Pulmonary effort is normal. No respiratory distress.      Breath sounds: Normal breath sounds. No wheezing or rales.   Chest:      Chest wall: No tenderness.   Abdominal:      General: There is no distension.      Palpations: Abdomen is soft. There is no  mass.      Tenderness: There is no abdominal tenderness. There is no guarding or rebound.   Musculoskeletal:         General: Normal range of motion.      Cervical back: Normal range of motion and neck supple.   Lymphadenopathy:      Cervical: No cervical adenopathy.   Skin:     General: Skin is warm and dry.   Neurological:      Mental Status: He is alert and oriented to person, place, and time.      Cranial Nerves: No cranial nerve deficit.   Psychiatric:         Mood and Affect: Mood normal.         Behavior: Behavior normal.         Thought Content: Thought content normal.         Judgment: Judgment normal.          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 (Left)   He had an ortho follow up 12/31/22 and 03/2023 and 05/2023  He had a fall from a roof around 14 feet in 08/2023 and ended with three fractured thoracic vertebra. He was admitted to [facility] and had surgery at [facility] and they infused from T9-T12. He is doing well.   Doing well    Rt shoulder injury at work:  He had surgery 11/2020, and 04/2021  Stable    Gastric ulcer:  He is in clinic for low back pain for 6 weeks as above. 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   He was referred by cardiology to GI because of anemia. He did EGD and colonscopy 01/25/2020 and was found to have a non-bleeding gastric ulcer 15mm that was biopsied and is scheduled for repeat EGD in 2 months. Colonscopy was normal. Results of scopes are sent for scanning. I told him to stop all  NSAID and Mobic at this time  He failed oral iron so cardiology ordered IV Iron.   He is scheduled to repeat EGD 05/07/20. I told him to restart Mobic with food if EGD is back to normal.   He had a repeat EGD 05/07/2020 by his outside GI doctor and was reported that gastric ulcer has healed.    Stable. Avoid NSAID  He had upset stomach recently similar to when he GI bleed before. No blood in stool. He had a bleeding ulcer before so I advised him to talk to his outside GI doctor to get another EGD. His Hb dropped from 13.7 on 09/16/21 to 12.6 on 01/10/22   He had a normal repeat EGD 02/28/22 ( copied sent for scanning)  CBC w diff    Lab Results   Component Value Date/Time    WBC 4.0 (L) 07/25/2023 10:55 AM    RBC 4.30 (L) 07/25/2023 10:55 AM    HGB 13.6 07/25/2023 10:55 AM    HCT 40.2 07/25/2023 10:55 AM    MCV 93.3 07/25/2023 10:55  AM    MCH 31.6 07/25/2023 10:55 AM    MCHC 33.9 07/25/2023 10:55 AM    RDW 13.2 07/25/2023 10:55 AM    PLTCT 225 07/25/2023 10:55 AM    MPV 6.3 (L) 07/25/2023 10:55 AM    Lab Results   Component Value Date/Time    NEUT 52 07/25/2023 10:55 AM    ANC 2.08 07/25/2023 10:55 AM    LYMA 32 07/25/2023 10:55 AM    ALC 1.29 07/25/2023 10:55 AM    MONA 11 07/25/2023 10:55 AM    AMC 0.44 07/25/2023 10:55 AM    EOSA 4 07/25/2023 10:55 AM    AEC 0.16 07/25/2023 10:55 AM    BASA 1 07/25/2023 10:55 AM    ABC 0.02 07/25/2023 10:55 AM        Shingles:  He had left arm and left chest wall shingles in 10/2021.   Resolved    Hypertension:  He is on HCTZ and Losarten  BP Readings from Last 3 Encounters:   12/01/23 133/87   11/12/23 134/86   07/28/23 137/77   continue  He had stress test and echo in 02/2016 that were reported as normal and I reviewed in care Every where  He saw cardiology and had a stress test 10/2021  He saw cardiology 11/12/23  Stable  Doing well    Coronary calcification:  CT calcium scan 08/2019:  Quantitative Coronary Calcium Score:   Left main: 0   LAD: 114   LCX: 144   RCA: 5 7   Total calcium score: 315   He saw cardiology and had echo and normal stress test 10/2019 and has a follow up 12/2019 and 09/16/21  He saw Cardiology 09/16/21 and 11/2021:  He had inconclusive coronary CT study and stress test so he ended with cardiac cath a/23/23 which showed no critical stenosis.   Doing well    Renal mass:  His calcium score was 315. He saw cardiology 12/2019. He had CTA chest which was normal but There was incidental cholelithiasis and a right renal lesion that will require ultrasound for further definition.     He had a normal stress test 11/09/2020  He had abdomen US 01/30/2020 which showed:  Small hypoechoic exophytic right renal nodule most compatible with a   hemorrhagic cyst   Cholelithiasis   Mild bladder wall thickening, nonspecific but most likely secondary to   bladder outlet stenosis or neurogenic bladder   Stable  Doing well    Hyperlipidemia:  He is on Zocor 20mg  daily but did not take for 30 days. He had cramps with Lipitor.  We increased Zocor to 40mg  daily 07/19/2019  Cardiology switched him to Crestor 10mg  daily and Zetia  Plan on repeat labs in 3 months  Lab Results   Component Value Date    CHOL 175 07/25/2023    TRIG 62 07/25/2023    HDL 65 07/25/2023    LDL 104 (H) 07/25/2023    VLDL 12 07/25/2023    NONHDLCHOL 110 07/25/2023      Anxiety and Depression:   He had for 20 years  He is on Effexor and Remeron  He is not feeling that this combination is helping  He is homicidal or suicidal but his brother committed suicide in 2013  We got him to taper off his medications over2 weeks and we he started Celexa 20mg  daily after he tapered off  We referred to psychologist  I am referring this patient to the behavior health  consultant for the following conditions:  Health Risk Behaviors:  Stress management  Mental Health Conditions:  Depression and Anxiety  We increased Celexa from 20 to 40mg  daily back in 08/2019  He is doing better    History of sleep apnea:  He is not snoring after he lost weight  He struggles with staying sleep and Ambien did not help  He saw sleep clinic 05/2021  Refill    Weakness:  Normal labs and TSH and B12 level   He is on ketogenic diet    Hearing deficits:  He has hearing aids    Routine health maintenance:    Colonoscopy: 2014, 2021, normal  Tdap: 2015  Pneumovax: Prevnar 07/19/2019 and plan on pneumovax in a year  Influenza:09/2018, 10/2019, 11/2021, 12/01/23  Eye exam: 2019  Dental exam: 2019  AAA screening: 07/2019  He had COVID vaccine 10/2020, 01/20/22    Return to clinic in 4 months with labs    Total of 41 minutes were spent on the same day of the visit including preparing to see the patient, obtaining and/or reviewing separately obtained history, performing a medically appropriate examination and/or evaluation, counseling and educating the patient/family/caregiver, ordering medications, tests, or procedures, referring and communication with other health care professionals, documenting clinical information in the electronic or other health record, independently interpreting results and communicating results to the patient/family/caregiver, and care coordination.   I Counseled patient regarding low back pain, depression and weakness. We discussed safety during COVID19 pandemic.     Orders Placed This Encounter    influenza (=>65 YO) high dose trivalent (FLUZONE) vaccine PF    HEPATITIS C ANTIBODY W REFLEX HCV PCR QUANT    CBC AND DIFF    BASIC METABOLIC PANEL    HEMOGLOBIN A1C    LIPID PROFILE    LIVER FUNCTION PANEL    MICROALB/CR RATIO-URINE RANDOM    TSH WITH FREE T4 REFLEX         Encounter Medications   Medications    losartan (COZAAR) 25 mg tablet     Sig: Take one tablet by mouth daily.     Future Appointments   Date Time Provider Department Center   12/04/2023  8:15 AM Criss Rosales, MD [facility]   05/19/2024  8:30 AM Kathie Rhodes, PA-C [facility] Exam     Patient Instructions   Get fasting labs few days before return to clinic in 4 months

## 2023-12-01 NOTE — Progress Notes
 Administered 0.63mL influenza quadrivalent vaccine into left deltoid.  Patient signed consent form and was given VIS sheet. Patient tolerated injection well and had no complaints.

## 2023-12-01 NOTE — Patient Instructions
 Get fasting labs few days before return to clinic in 4 months

## 2023-12-03 ENCOUNTER — Encounter: Admit: 2023-12-03 | Discharge: 2023-12-03 | Payer: MEDICARE

## 2023-12-04 ENCOUNTER — Encounter: Admit: 2023-12-04 | Discharge: 2023-12-04 | Payer: MEDICARE

## 2023-12-04 ENCOUNTER — Ambulatory Visit: Admit: 2023-12-04 | Discharge: 2023-12-04 | Payer: BC Managed Care – PPO

## 2023-12-04 ENCOUNTER — Ambulatory Visit: Admit: 2023-12-04 | Discharge: 2023-12-05 | Payer: MEDICARE

## 2023-12-04 DIAGNOSIS — Z981 Arthrodesis status: Secondary | ICD-10-CM

## 2023-12-04 NOTE — Progress Notes
[facility] CLINIC NOTE    Subjective     SUBJECTIVE   Jared Hebert is a 72 y.o. male who returns for follow up 1 year s/p anterior posterior lumbar interbody fusion L2-5 and posterior lumbar decompression L3-4 on 11/24/2022.  Since we last saw him in June, he underwent surgery with Dr. Maryellen Pile in September at [facility]  He now has T10 and T11 instrumentation tying in rods with side to side connectors into our instrumentation.  Patient reports he fell through a barn roof flat on his back.  He reports he fractured one, maybe two, thoracic vertebra.  He was in [place], [place], at the time and was taken to [facility].  He requested to transfer to [place] but was told that I was not available.  He was placed in a brace post-operatively.  He continues to wear the brace on occasion.  He followed up with Dr. Janice Norrie office two weeks ago and was told he could follow up prn at that point.  He reports he is doing well at this point.  He has no real complaints today.  No wound issues.         REVIEW OF SYSTEMS   Review of Systems   Musculoskeletal:  Positive for back pain.   All other systems reviewed and are negative.    ALLERGIES     Allergies   Allergen Reactions    Seasonal Allergies SNEEZING       MEDICATIONS     Current Outpatient Medications on File Prior to Visit   Medication Sig Dispense Refill    ascorbic acid (VITAMIN C) 500 mg tablet Take one tablet by mouth daily.      cetirizine (ZYRTEC) 10 mg tablet Take one tablet by mouth every morning.      citalopram (CELEXA) 20 mg tablet Take one tablet by mouth daily. Please adjust dose down to 20mg  daily as in the script. Please discontinue Omeprazole script. 90 tablet 3    cyclobenzaprine (FLEXERIL) 5 mg tablet Take one tablet to two tablets by mouth daily as needed.      ezetimibe (ZETIA) 10 mg tablet TAKE 1 TABLET BY MOUTH EVERY DAY IN THE MORNING 90 tablet 3    fluticasone propionate (FLONASE) 50 mcg/actuation nasal spray, suspension Apply one spray to each nostril as directed at bedtime daily. Shake bottle gently before using.      folic acid (FOLVITE) 1 mg tablet TAKE 1 TABLET BY MOUTH EVERY DAY 90 tablet 2    gabapentin (NEURONTIN) 300 mg capsule TAKE 1 CAPSULE BY MOUTH EVERY 8 HOURS 270 capsule 3    GLUCOSAMINE SULFATE PO Take 1,500 mg by mouth every morning.      ketoconazole (NIZORAL) 2 % topical cream APPLY TO AFFECTED AREA TWICE DAILY FOR 14 DAYS 30 g 1    losartan (COZAAR) 25 mg tablet Take one tablet by mouth daily.      meloxicam (MOBIC) 15 mg tablet Take one tablet by mouth every morning.      omeprazole DR (PRILOSEC) 40 mg capsule Take one capsule by mouth daily before breakfast. 90 capsule 1    rosuvastatin (CRESTOR) 20 mg tablet TAKE 1 TABLET BY MOUTH EVERY DAY 90 tablet 3    zolpidem (AMBIEN) 5 mg tablet TAKE 1 TABLET BY MOUTH AT BEDTIME AS NEEDED FOR SLEEP 30 tablet 5     No current facility-administered medications on file prior to visit.       PHYSICAL EXAM  Vitals:    12/04/23 0803   BP: 131/80   BP Source: Arm, Left Upper   Pulse: 69   Temp: 36.3 ?C (97.3 ?F)   SpO2: 99%   TempSrc: Temporal   PainSc: Two   Weight: 87.5 kg (193 lb)   Height: 172.7 cm (5' 8)     Oswestry Total Score:: (Patient-Rptd) 10  Pain Score: Two  Body mass index is 29.35 kg/m?Marland Kitchen    Constitutional: Alert, NAD  Head: Atraumatic  Eyes: EOMI  Respiratory: Unlabored breathing  Cardiovascular: Regular rate  Skin: No rashes or open wounds appreciated on back  Musculoskeletal: Strength stable  Neurologic: Sensation stable    RADIOLOGY     T10 and T11 instrumentation tying in rods with side to side connectors into our instrumentation.  He appears to have had a fracture at T12.  I don't have any CT scans for review from at the time of his injury in late August, early September.         ASSESSMENT / PLAN   Jared Hebert is a 72 y.o. male 1 year s/p anterior posterior lumbar interbody fusion L2-5 and posterior lumbar decompression L3-4 on 11/24/2022, now with an instrumentation construct extending up to T10 that was done at outside hospital following a fall through a barn roof.      He unfortunately had this fall and now has a fusion from T9-12.  Everything looks stable on films today.  I have cautioned him against getting up on any elevated surfaces and avoiding any further falls.  I don't have a problem with him taking anti-inflammatories at this point.  I will plan to continue to follow him.  We will plan to follow up with him in 4 months.  He is to call the office with any questions or concerns in between now and then.         In the presence of Criss Rosales, MD , I have taken down these notes, Mamie Laurel, Scribe. December 04, 2023 8:44 AM

## 2023-12-08 ENCOUNTER — Encounter: Admit: 2023-12-08 | Discharge: 2023-12-08 | Payer: MEDICARE

## 2023-12-28 ENCOUNTER — Encounter: Admit: 2023-12-28 | Discharge: 2023-12-28 | Payer: MEDICARE

## 2024-01-03 ENCOUNTER — Encounter: Admit: 2024-01-03 | Discharge: 2024-01-03 | Payer: MEDICARE

## 2024-01-14 NOTE — Progress Notes
Pt hospitalized since last office visit: No    Health Maintenance Due   Topic Date Due    MEDICARE ANNUAL WELLNESS VISIT  Never done    ADVANCE CARE PLANNING DISCUSSION AND DOCUMENTATION  Never done    SHINGLES RECOMBINANT VACCINE (1 of 2) Never done    RSV Vaccine (60 years and Older/Pregnant Women) (1 - Risk 60-74 years 1-dose series) Never done    COVID-19 VACCINE (6 - 2024-25 season) 08/23/2023       There are no diagnoses linked to this encounter.    Labs not done:      Lab Frequency Next Occurrence   REQUEST FOR CARDIOLOGY APPOINTMENT Once 07/10/2022   REQUEST FOR CARDIOLOGY APPOINTMENT Once 09/16/2023   REQUEST FOR CARDIOLOGY APPOINTMENT Once 05/11/2024   LIPID PROFILE Once 11/19/2023   REQUEST FOR CARDIOLOGY APPOINTMENT Once 11/11/2024   CBC AND DIFF Once 02/29/2024   BASIC METABOLIC PANEL Once 02/29/2024   HEMOGLOBIN A1C Once 02/29/2024   LIPID PROFILE Once 02/29/2024   LIVER FUNCTION PANEL Once 02/29/2024   MICROALB/CR RATIO-URINE RANDOM Once 02/29/2024   TSH WITH FREE T4 REFLEX Once 02/29/2024       No labs due at this time.  Urgent care visit.     Notes to provider:        Answers submitted by the patient for this visit:  Other Symptom Questionnaire (Submitted on 01/12/2024)  Chief Complaint: Patient Reported Other  What topic(s) would you like to cover during your appointment?: Left Chest Pain that is not cardiac related.  Increasing pain just  behind left nipple.  Pain is sharply increased with palpation of the area.  Originally thought to be caused by support pads on Thoracic Back Brace worn after fall.  Have not worn back brace for more than 30 days and area continues to become more painful.  Please describe the issue(s) and history with the issue (location, severity, duration, symptoms, etc.). : See above.  What has been done so far to take care of the issue(s)?: Nothing reduces the pain, especially when pressure is applied to area.  What are your goals for this visit?: Find out the cause of the pain and resolve issue.

## 2024-01-19 ENCOUNTER — Ambulatory Visit: Admit: 2024-01-19 | Discharge: 2024-01-20 | Payer: MEDICARE

## 2024-01-19 ENCOUNTER — Encounter: Admit: 2024-01-19 | Discharge: 2024-01-19 | Payer: MEDICARE

## 2024-01-19 ENCOUNTER — Ambulatory Visit: Admit: 2024-01-19 | Discharge: 2024-01-19 | Payer: BC Managed Care – PPO

## 2024-01-19 DIAGNOSIS — K21 Gastroesophageal reflux disease with esophagitis without hemorrhage: Secondary | ICD-10-CM

## 2024-01-19 DIAGNOSIS — N6322 Unspecified lump in the left breast, upper inner quadrant: Secondary | ICD-10-CM

## 2024-01-19 DIAGNOSIS — Z0000 Encounter for general adult medical examination without abnormal findings: Secondary | ICD-10-CM

## 2024-01-19 DIAGNOSIS — R079 Chest pain, unspecified: Secondary | ICD-10-CM

## 2024-01-19 MED ORDER — CITALOPRAM 40 MG PO TAB
20 mg | ORAL_TABLET | Freq: Every day | ORAL | 3 refills | Status: AC
Start: 2024-01-19 — End: ?

## 2024-01-19 MED ORDER — MELOXICAM 15 MG PO TAB
15 mg | ORAL_TABLET | Freq: Every morning | ORAL | 3 refills | 30.00000 days | Status: AC
Start: 2024-01-19 — End: ?

## 2024-01-19 MED ORDER — GABAPENTIN 300 MG PO CAP
300 mg | ORAL_CAPSULE | ORAL | 3 refills | Status: AC
Start: 2024-01-19 — End: ?

## 2024-01-19 NOTE — Progress Notes
Answers submitted by the patient for this visit:  Other Symptom Questionnaire (Submitted on 01/12/2024)  Chief Complaint: Patient Reported Other  What topic(s) would you like to cover during your appointment?: Left Chest Pain that is not cardiac related.  Increasing pain just  behind left nipple.  Pain is sharply increased with palpation of the area.  Originally thought to be caused by support pads on Thoracic Back Brace worn after fall.  Have not worn back brace for more than 30 days and area continues to become more painful.  Please describe the issue(s) and history with the issue (location, severity, duration, symptoms, etc.). : See above.  What has been done so far to take care of the issue(s)?: Nothing reduces the pain, especially when pressure is applied to area.  What are your goals for this visit?: Find out the cause of the pain and resolve issue.  Answers submitted by the patient for this visit:  Other Symptom Questionnaire (Submitted on 11/27/2023)  Chief Complaint: Patient Reported Other  What topic(s) would you like to cover during your appointment?: Bi-yearly check up.  Please describe the issue(s) and history with the issue (location, severity, duration, symptoms, etc.). : *  What has been done so far to take care of the issue(s)?: *  What are your goals for this visit?: *    Date of Service: 01/19/2024    Jared Hebert is a 73 y.o. male. DOB: Apr 22, 1951   MRN#: [id number]    Subjective: follow up       Patient Reported Other  What topic(s) would you like to cover during your appointment?:  Semi-Annual Check Up  Please describe the issue(s) and history with the issue (location, severity, duration, symptoms, etc.).:  None  What has been done so far to take care of the issue(s)?:  -  What are your goals for this visit?:  -    Jared Hebert is 73 y.o. patient who presents to clinic for follow up. He established care 07/19/2019. He is a retired Engineer, civil (consulting) in [employer]. No smoking, no alcohol, no street drugs. No regular exercise. He has 4 children.     He was last seen in our clinic by me 07/2023    He had a fall from a roof around 14 feet in 08/2023 and ended with three fractured thoracic vertebra. He was admitted to [facility] and had surgery at [facility] and they infused from T9-T12. He is doing well.     He follows with spine center and saw them 04/11/22. He had a back injection 04/24/22  He had back surgery 11/24/22:   Anterior /Posterior Lumbar two -lumbar Five decompression and fusion with infuse and allograft. (Left)   He had an ortho follow up 12/31/22 and 02/2023 and 05/2023    He had Rt should er surgery 11/2020, 04/2021 and again in 04/2021 at [facility].     He saw sleep clinic 05/2021    He saw Cardiology 09/16/21 and 11/2021:  He had inconclusive coronary CT study and stress test so he ended with cardiac cath a/23/23 which showed no critical stenosis. He saw cardiology 11/12/23.     He had left arm and left chest wall shingles in 10/2021.     He had upset stomach recently similar to when he GI bleed before. No blood in stool. He had a bleeding ulcer before so I advised him to talk to his outside GI doctor to get another EGD. His Hb dropped from 13.7 on  09/16/21 to 12.6 on 01/10/22   He had a normal repeat EGD 02/28/22 ( copied sent for scanning)    He was in clinic for low back pain for 6 weeks as above. 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   He was referred by cardiology to GI because of anemia. He did EGD and colonscopy 01/25/2020 and was found to have a non-bleeding gastric ulcer 15mm that was biopsied and is scheduled for repeat EGD in 2 months. Colonscopy was normal. Results of scopes are sent for scanning. I told him to stop all  NSAID and Mobic at this time. He had a repeat EGD 05/07/2020 by his outside GI doctor and was reported that gastric ulcer has healed.      His calcium score was 315. He saw cardiology 12/2019. He had CTA chest which was normal but There was incidental cholelithiasis and a right renal lesion that will require ultrasound for further definition.     He had a normal stress test 11/09/2020  He failed oral iron so cardiology ordered IV Iron.     He had labs drawn 12/01/23    He had abdomen US 01/30/2020 which showed:  Small hypoechoic exophytic right renal nodule most compatible with a   hemorrhagic cyst   Cholelithiasis   Mild bladder wall thickening, nonspecific but most likely secondary to   bladder outlet stenosis or neurogenic bladder     He managed to lose intentionally 55 Lb over last 1 year by having ketogenic diet. He takes multivitamin daily.    He also struggles with depression but no intent to harm himself or others. His brother had depression and committed suicide in 2003.       Past Medical History:    Abnormal cardiac function test    Anemia, unspecified    Arthritis    Cataract    Coronary artery disease    Coronary atherosclerosis    Degenerative disc disease, lumbar    Degenerative disc disease, thoracic    Depression    Displaced fracture of acromial process, right shoulder, initial encounter for closed fracture    Generalized headaches    GERD (gastroesophageal reflux disease)    Hearing loss    Hyperlipidemia    Hypertension    Hypertriglyceridemia    Joint pain    Joint stiffness    Nontraumatic complete tear of rotator cuff    Obesity    Sleep apnea    Spinal stenosis    Ulcer       Surgical History:   Procedure Laterality Date    VASECTOMY  1984    HX CATARACT REMOVAL Bilateral 02/2017    OS Toric    PARS PLANA MECHANICAL VITRECTOMY WITH  Kenolog Right 09/02/2018    Performed by Blair Hailey, MD at [facility] OR    PARS PLANA MECHANICAL VITRECTOMY Left 11/04/2018    Performed by Blair Hailey, MD at [facility] OR    ANGIOGRAPHY CORONARY ARTERY WITH LEFT HEART CATHETERIZATION N/A 01/13/2022    Performed by Marcell Barlow, MD at [facility] CATH LAB    POSSIBLE PERCUTANEOUS CORONARY STENT PLACEMENT WITH ANGIOPLASTY N/A 01/13/2022    Performed by Marcell Barlow, MD at [facility] CATH LAB    Anterior /Posterior Lumbar two -lumbar Five decompression and fusion with infuse and allograft. Left 11/24/2022    Performed by Criss Rosales, MD at [facility] OR    THORACIC FUSION  08/2023    ABDOMEN SURGERY  11/2022    Lumbar Fusion    CARDIAC CATHERIZATION      CARDIOVASCULAR STRESS TEST      ECHOCARDIOGRAM PROCEDURE      ELECTROCARDIOGRAM      HX ADENOIDECTOMY      HX ARTHROSCOPIC SURGERY      HX JOINT REPLACEMENT  11/2020    Right Shoulder    HX TONSILLECTOMY      KNEE SURGERY  Left Knee Scope 4 yrs ago    LAMINECTOMY  11/2022    NASAL SURGERY      TWICE    SHOULDER ARTHROPLASTY Right 04/2021    Right Shoulder - Injury    SURGERY  11/2022.  08/2023.    UMBILICAL ARTERIAL CATH - BEDSIDE  Less than a year    No Stents Placed       family history includes Arthritis in his father and mother; Back pain in his brother, father, and mother; Cataract in his father and mother; Coronary Artery Disease in his father; Depression in his brother; Heart Disease in his father; Heart problem in his father; High Cholesterol in his father; Hypertension in his father; Joint Pain in his brother, father, and mother; Osteoporosis in his mother.    Social History     Socioeconomic History    Marital status: Married     Spouse name: Bonita Quin   Tobacco Use    Smoking status: Former     Current packs/day: 0.00     Average packs/day: 1 pack/day for 39.4 years (39.4 ttl pk-yrs)     Types: Cigarettes, Pipe, Cigars     Start date: 08/04/1967     Quit date: 12/22/2006     Years since quitting: 17.0     Passive exposure: Past    Smokeless tobacco: Former     Types: Chew     Quit date: 12/22/2006    Tobacco comments:     15 + yrs since last smoked   Vaping Use    Vaping status: Never Used   Substance and Sexual Activity    Alcohol use: Yes     Alcohol/week: 7.0 standard drinks of alcohol     Types: 7 Cans of beer per week     Comment: 1 Beer each night prior to going to bed    Drug use: Never    Sexual activity: Yes Partners: Female     Birth control/protection: Surgical       Social History     Tobacco Use    Smoking status: Former     Current packs/day: 0.00     Average packs/day: 1 pack/day for 39.4 years (39.4 ttl pk-yrs)     Types: Cigarettes, Pipe, Cigars     Start date: 08/04/1967     Quit date: 12/22/2006     Years since quitting: 17.0     Passive exposure: Past    Smokeless tobacco: Former     Types: Chew     Quit date: 12/22/2006    Tobacco comments:     15 + yrs since last smoked   Substance Use Topics    Alcohol use: Yes     Alcohol/week: 7.0 standard drinks of alcohol     Types: 7 Cans of beer per week     Comment: 1 Beer each night prior to going to bed        Review of Systems   Constitutional: Negative for chills, diaphoresis, fever, malaise/fatigue, night sweats,  weight gain and weight loss.   HENT:  Negative for congestion, sore throat and stridor.    Eyes:  Negative for blurred vision.   Cardiovascular:  Negative for chest pain, orthopnea, palpitations and syncope.   Respiratory:  Negative for cough, shortness of breath, snoring and wheezing.    Skin:  Negative for rash.   Musculoskeletal:  Negative for back pain, joint pain, joint swelling, myalgias and neck pain.   Gastrointestinal:  Negative for abdominal pain, anorexia, change in bowel habit, diarrhea, nausea and vomiting.   Genitourinary:  Negative for dysuria, flank pain and hesitancy.   Neurological:  Negative for disturbances in coordination, excessive daytime sleepiness, dizziness, focal weakness, headaches, numbness, seizures, vertigo and weakness.   Psychiatric/Behavioral:  Negative for depression, hallucinations and memory loss. The patient is not nervous/anxious.    Allergic/Immunologic: Negative for hives.   All other systems reviewed and are negative.      Objective:      ascorbic acid (VITAMIN C) 500 mg tablet Take one tablet by mouth daily.    cetirizine (ZYRTEC) 10 mg tablet Take one tablet by mouth every morning.    citalopram (CELEXA) 40 mg tablet Take one-half tablet by mouth daily.    cyclobenzaprine (FLEXERIL) 5 mg tablet Take one tablet to two tablets by mouth daily as needed.    ezetimibe (ZETIA) 10 mg tablet TAKE 1 TABLET BY MOUTH EVERY DAY IN THE MORNING    fluticasone propionate (FLONASE) 50 mcg/actuation nasal spray, suspension Apply one spray to each nostril as directed at bedtime daily. Shake bottle gently before using.    folic acid (FOLVITE) 1 mg tablet TAKE 1 TABLET BY MOUTH EVERY DAY    gabapentin (NEURONTIN) 300 mg capsule Take one capsule by mouth every 8 hours.    GLUCOSAMINE SULFATE PO Take 1,500 mg by mouth every morning.    ketoconazole (NIZORAL) 2 % topical cream APPLY TO AFFECTED AREA TWICE DAILY FOR 14 DAYS    losartan (COZAAR) 25 mg tablet Take one tablet by mouth daily.    meloxicam (MOBIC) 15 mg tablet Take one tablet by mouth every morning.    omeprazole DR (PRILOSEC) 40 mg capsule TAKE ONE CAPSULE BY MOUTH DAILY BEFORE BREAKFAST.    rosuvastatin (CRESTOR) 20 mg tablet TAKE 1 TABLET BY MOUTH EVERY DAY    zolpidem (AMBIEN) 5 mg tablet TAKE 1 TABLET BY MOUTH AT BEDTIME AS NEEDED FOR SLEEP     Vitals:    01/19/24 1000   BP: 125/83   BP Source: Arm, Left Upper   Pulse: 74   Temp: 36.7 ?C (98.1 ?F)   Resp: 16   TempSrc: Oral   PainSc: Two   Weight: 88.7 kg (195 lb 9.6 oz)   Height: 172.7 cm (5' 7.99)       Body mass index is 29.75 kg/m?Marland Kitchen     Physical Exam  Vitals and nursing note reviewed.   Constitutional:       General: He is not in acute distress.     Appearance: He is well-developed. He is not diaphoretic.   HENT:      Head: Normocephalic and atraumatic.      Nose: Nose normal.   Eyes:      General: No scleral icterus.        Right eye: No discharge.         Left eye: No discharge.      Conjunctiva/sclera: Conjunctivae normal.      Pupils: Pupils are equal, round,  and reactive to light.   Neck:      Thyroid: No thyromegaly.      Vascular: No JVD.      Trachea: No tracheal deviation.   Cardiovascular:      Rate and Rhythm: Normal rate and regular rhythm.      Heart sounds: Normal heart sounds. No murmur heard.     No friction rub.   Pulmonary:      Effort: Pulmonary effort is normal. No respiratory distress.      Breath sounds: Normal breath sounds. No wheezing or rales.   Chest:      Chest wall: Tenderness present.          Comments: Tenderness, firm at 11 o'clock position 2 cm from nipple  Abdominal:      General: There is no distension.      Palpations: Abdomen is soft. There is no mass.      Tenderness: There is no abdominal tenderness. There is no guarding or rebound.   Musculoskeletal:         General: Normal range of motion.      Cervical back: Normal range of motion and neck supple.   Lymphadenopathy:      Cervical: No cervical adenopathy.   Skin:     General: Skin is warm and dry.   Neurological:      Mental Status: He is alert and oriented to person, place, and time.      Cranial Nerves: No cranial nerve deficit.   Psychiatric:         Mood and Affect: Mood normal.         Behavior: Behavior normal.         Thought Content: Thought content normal.         Judgment: Judgment normal.          Assessment and Plan:      Left chest wall pain:  He used to wear a brace after his back surgery in 08/2023. He is oiff brace since 11/2023.  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 (Left)   He had an ortho follow up 12/31/22 and 03/2023 and 05/2023  He had a fall from a roof around 14 feet in 08/2023 and ended with three fractured thoracic vertebra. He was admitted to [facility] and had surgery at [facility] and they infused from T9-T12. He is doing well.   Doing well  Stable    Rt shoulder injury at work:  He had surgery 11/2020, and 04/2021  Stable    Gastric ulcer:  He is in clinic for low back pain for 6 weeks as above. 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   He was referred by cardiology to GI because of anemia.  He did EGD and colonscopy 01/25/2020 and was found to have a non-bleeding gastric ulcer 15mm that was biopsied and is scheduled for repeat EGD in 2 months. Colonscopy was normal. Results of scopes are sent for scanning. I told him to stop all  NSAID and Mobic at this time  He failed oral iron so cardiology ordered IV Iron.   He is scheduled to repeat EGD 05/07/20. I told him to restart Mobic with food if EGD is back to normal.   He had a repeat EGD 05/07/2020 by his outside GI doctor and was reported that gastric ulcer has healed.    Stable. Avoid NSAID  He had upset stomach recently similar to when he GI bleed before. No blood in stool. He had a bleeding ulcer before so I advised him to talk to his outside GI doctor to get another EGD. His Hb dropped from 13.7 on 09/16/21 to 12.6 on 01/10/22   He had a normal repeat EGD 02/28/22 ( copied sent for scanning)  CBC w diff    Lab Results   Component Value Date/Time    WBC 3.60 (L) 12/01/2023 10:08 AM    RBC 4.48 12/01/2023 10:08 AM    HGB 14.0 12/01/2023 10:08 AM    HCT 41.0 12/01/2023 10:08 AM    MCV 91.5 12/01/2023 10:08 AM    MCH 31.2 12/01/2023 10:08 AM    MCHC 34.1 12/01/2023 10:08 AM    RDW 13.4 12/01/2023 10:08 AM    PLTCT 221 12/01/2023 10:08 AM    MPV 6.3 (L) 12/01/2023 10:08 AM    Lab Results   Component Value Date/Time    NEUT 51 12/01/2023 10:08 AM    ANC 1.90 12/01/2023 10:08 AM    LYMA 33 12/01/2023 10:08 AM    ALC 1.20 12/01/2023 10:08 AM    MONA 12 12/01/2023 10:08 AM    AMC 0.40 12/01/2023 10:08 AM    EOSA 3 12/01/2023 10:08 AM    AEC 0.10 12/01/2023 10:08 AM    BASA 0 12/01/2023 10:08 AM    ABC 0.00 12/01/2023 10:08 AM        Shingles:  He had left arm and left chest wall shingles in 10/2021.   Resolved    Hypertension:  He is on HCTZ and [place]arten  BP Readings from Last 3 Encounters:   01/19/24 125/83   12/04/23 131/80   12/01/23 133/87   continue  He had stress test and echo in 02/2016 that were reported as normal and I reviewed in care Every where  He saw cardiology and had a stress test 10/2021  He saw cardiology 11/12/23  Stable  Doing well    Coronary calcification:  CT calcium scan 08/2019:  Quantitative Coronary Calcium Score:   Left main: 0   LAD: 114   LCX: 144   RCA: 5 7   Total calcium score: 315   He saw cardiology and had echo and normal stress test 10/2019 and has a follow up 12/2019 and 09/16/21  He saw Cardiology 09/16/21 and 11/2021:  He had inconclusive coronary CT study and stress test so he ended with cardiac cath a/23/23 which showed no critical stenosis.   Doing well    Renal mass:  His calcium score was 315. He saw cardiology 12/2019. He had CTA chest which was normal but There was incidental cholelithiasis and a right renal lesion that will require ultrasound for further definition.     He had a normal stress test 11/09/2020  He had abdomen US 01/30/2020 which showed:  Small hypoechoic exophytic right renal nodule most compatible with a   hemorrhagic cyst   Cholelithiasis   Mild bladder wall thickening, nonspecific but most likely secondary to   bladder outlet stenosis or neurogenic bladder   Stable  Doing well    Hyperlipidemia:  He is on Zocor 20mg  daily but did not take for 30 days. He had cramps with Lipitor.  We increased Zocor to 40mg  daily 07/19/2019  Cardiology switched him to Crestor 10mg  daily and Zetia  Plan on repeat labs in 3 months  Lab Results   Component Value Date    CHOL 139 12/01/2023    TRIG 73 12/01/2023    HDL 57 12/01/2023    LDL 80 12/01/2023    VLDL 16.1 12/01/2023    NONHDLCHOL 82 12/01/2023      Anxiety and Depression:   He had for 20 years  He is on Effexor and Remeron  He is not feeling that this combination is helping  He is homicidal or suicidal but his brother committed suicide in 2013  We referred to psychologist  I am referring this patient to the behavior health consultant for the following conditions:  Health Risk Behaviors:  Stress management  Mental Health Conditions:  Depression and Anxiety  We increased Celexa from 20 to 40mg  daily back in 08/2019  Refill Celexa 40mg  daily  He is doing better    History of sleep apnea:  He is not snoring after he lost weight  He struggles with staying sleep and Ambien did not help  He saw sleep clinic 05/2021  Refill    Weakness:  Normal labs and TSH and B12 level   He is on ketogenic diet    Hearing deficits:  He has hearing aids    Routine health maintenance:    Colonoscopy: 2014, 2021, normal  Tdap: 2015  Pneumovax: Prevnar 07/19/2019 and plan on pneumovax in a year  Influenza:09/2018, 10/2019, 11/2021, 12/01/23  Eye exam: 2019  Dental exam: 2019  AAA screening: 07/2019  He had COVID vaccine 10/2020, 01/20/22    Return to clinic in 4 months with labs    Total of 45 minutes were spent on the same day of the visit including preparing to see the patient, obtaining and/or reviewing separately obtained history, performing a medically appropriate examination and/or evaluation, counseling and educating the patient/family/caregiver, ordering medications, tests, or procedures, referring and communication with other health care professionals, documenting clinical information in the electronic or other health record, independently interpreting results and communicating results to the patient/family/caregiver, and care coordination.   I Counseled patient regarding low back pain, depression and weakness. We discussed safety during COVID19 pandemic.     Orders Placed This Encounter    RIBS UNI LEFT W CHEST MIN 3V    MAMMO DIAG LT    US BREAST COMPLETE LT    COVID-19 VACCINE (>=12YO) (MODERNA) MRNA PF    gabapentin (NEURONTIN) 300 mg capsule    meloxicam (MOBIC) 15 mg tablet    citalopram (CELEXA) 40 mg tablet           Encounter Medications   Medications    gabapentin (NEURONTIN) 300 mg capsule     Sig: Take one capsule by mouth every 8 hours.     Dispense:  270 capsule     Refill:  3     Please place refills on file.    meloxicam (MOBIC) 15 mg tablet  Sig: Take one tablet by mouth every morning.     Dispense:  90 tablet     Refill:  3    citalopram (CELEXA) 40 mg tablet     Sig: Take one-half tablet by mouth daily.     Dispense:  90 tablet     Refill:  3     Future Appointments   Date Time Provider Department Center   04/08/2024  8:30 AM APP [facility]   05/19/2024  8:30 AM Roberts Gaudy A, PA-C [facility] Exam     There are no Patient Instructions on file for this visit.

## 2024-01-19 NOTE — Progress Notes
Pt presented to clinic for dose of spikevax.  Injected 0.5 ml into left deltoid.  Pt remained for 15 minute observation.  Pt tolerated well.

## 2024-01-20 DIAGNOSIS — F32A Depression, unspecified: Secondary | ICD-10-CM

## 2024-01-20 DIAGNOSIS — F419 Anxiety disorder, unspecified: Secondary | ICD-10-CM

## 2024-01-20 DIAGNOSIS — F5101 Primary insomnia: Secondary | ICD-10-CM

## 2024-01-20 DIAGNOSIS — E782 Mixed hyperlipidemia: Secondary | ICD-10-CM

## 2024-01-20 DIAGNOSIS — I10 Essential (primary) hypertension: Secondary | ICD-10-CM

## 2024-01-22 ENCOUNTER — Encounter: Admit: 2024-01-22 | Discharge: 2024-01-22 | Payer: MEDICARE

## 2024-01-22 ENCOUNTER — Ambulatory Visit: Admit: 2024-01-22 | Discharge: 2024-01-23 | Payer: BC Managed Care – PPO

## 2024-03-29 NOTE — Patient Instructions
 It was a pleasure seeing you in clinic today.    Oretha Milch BSN, RN  Clinical Nurse Coordinator  Dr. Ivin Booty T. Bunch  [facility]  [address]  [place], [place] [address]  Phone: [phone]  Fax [phone]   Scheduling [phone]  [email or web address]    We appreciate the interest in your health.  The physician reviews both the radiology reports and the imaging independently and will contact you if there are any emergent findings.  If you would like, incidental and chronic changes seen in your images can be reviewed with Dr. Liam Rogers at a follow up appointment.  At that appointment, the radiology report and terminology used in that report can also be explained. Please call our Schedule line if you would like to follow up.

## 2024-03-31 ENCOUNTER — Encounter: Admit: 2024-03-31 | Discharge: 2024-03-31 | Payer: MEDICARE

## 2024-03-31 DIAGNOSIS — Z09 Encounter for follow-up examination after completed treatment for conditions other than malignant neoplasm: Secondary | ICD-10-CM

## 2024-03-31 DIAGNOSIS — Z981 Arthrodesis status: Secondary | ICD-10-CM

## 2024-04-04 ENCOUNTER — Encounter: Admit: 2024-04-04 | Discharge: 2024-04-04 | Payer: MEDICARE

## 2024-04-07 ENCOUNTER — Encounter: Admit: 2024-04-07 | Discharge: 2024-04-07 | Payer: MEDICARE

## 2024-04-08 ENCOUNTER — Encounter: Admit: 2024-04-08 | Discharge: 2024-04-08 | Payer: MEDICARE

## 2024-04-08 ENCOUNTER — Ambulatory Visit: Admit: 2024-04-08 | Discharge: 2024-04-08 | Payer: BLUE CROSS/BLUE SHIELD

## 2024-04-08 ENCOUNTER — Ambulatory Visit: Admit: 2024-04-08 | Discharge: 2024-04-08 | Payer: MEDICARE

## 2024-04-08 DIAGNOSIS — S22009D Unspecified fracture of unspecified thoracic vertebra, subsequent encounter for fracture with routine healing: Secondary | ICD-10-CM

## 2024-04-08 DIAGNOSIS — Z981 Arthrodesis status: Secondary | ICD-10-CM

## 2024-04-08 NOTE — Progress Notes
 [facility] CLINIC NOTE    Subjective     REASON FOR VISIT   Pain of the Lower Back      SUBJECTIVE   Jared Hebert is a 73 y.o. male who returns today for repeat evaluation.  He is now 1 year 4 months out from his operation.  He has an instrument construct extending up to T10 that was done by Dr. Sherrlyn Dolores.  To clarify, his surgery was done on August 20, 2023, and not September.  He is doing well.  He has no complaints today.         REVIEW OF SYSTEMS   Review of Systems   All other systems reviewed and are negative.      ALLERGIES     Allergies   Allergen Reactions    Seasonal Allergies SNEEZING       MEDICATIONS     Current Outpatient Medications on File Prior to Visit   Medication Sig Dispense Refill    ascorbic acid (VITAMIN C) 500 mg tablet Take one tablet by mouth daily.      cetirizine (ZYRTEC) 10 mg tablet Take one tablet by mouth every morning.      citalopram (CELEXA) 40 mg tablet Take one-half tablet by mouth daily. 90 tablet 3    cyclobenzaprine (FLEXERIL) 5 mg tablet Take one tablet to two tablets by mouth daily as needed.      ezetimibe (ZETIA) 10 mg tablet TAKE 1 TABLET BY MOUTH EVERY DAY IN THE MORNING 90 tablet 3    fluticasone propionate (FLONASE) 50 mcg/actuation nasal spray, suspension Apply one spray to each nostril as directed at bedtime daily. Shake bottle gently before using.      folic acid (FOLVITE) 1 mg tablet TAKE 1 TABLET BY MOUTH EVERY DAY 90 tablet 2    gabapentin (NEURONTIN) 300 mg capsule Take one capsule by mouth every 8 hours. 270 capsule 3    GLUCOSAMINE SULFATE PO Take 1,500 mg by mouth every morning.      ketoconazole (NIZORAL) 2 % topical cream APPLY TO AFFECTED AREA TWICE DAILY FOR 14 DAYS 30 g 1    losartan (COZAAR) 25 mg tablet Take one tablet by mouth daily.      meloxicam (MOBIC) 15 mg tablet Take one tablet by mouth every morning. 90 tablet 3    omeprazole DR (PRILOSEC) 40 mg capsule TAKE ONE CAPSULE BY MOUTH DAILY BEFORE BREAKFAST. 90 capsule 1    rosuvastatin (CRESTOR) 20 mg tablet TAKE 1 TABLET BY MOUTH EVERY DAY 90 tablet 3    zolpidem (AMBIEN) 5 mg tablet TAKE 1 TABLET BY MOUTH AT BEDTIME AS NEEDED FOR SLEEP 30 tablet 5     No current facility-administered medications on file prior to visit.       PHYSICAL EXAM     Vitals:    04/08/24 0856   BP: (!) 146/83   BP Source: Arm, Right Upper   Pulse: 69   Temp: 36.3 ?C (97.3 ?F)   SpO2: 99%   TempSrc: Temporal   PainSc: Zero   Weight: 86.7 kg (191 lb 3.2 oz)   Height: 172.7 cm (5' 8)     [place]swestry Total Score:: (Patient-Rptd) 12  Pain Score: Zero  Body mass index is 29.07 kg/m?Jared Hebert    Constitutional: Alert, NAD  Head: Atraumatic  Eyes: EOMI  Respiratory: Unlabored breathing  Cardiovascular: Regular rate  Skin: No rashes or open wounds appreciated on back  Musculoskeletal: Strength stable  Neurologic: Sensation stable  RADIOLOGY     Imaging is reviewed.  There is no evidence of loosening or failure.  Implants are in stable position.  Alignment looks good.         ASSESSMENT / PLAN   Jared Hebert is a 73 y.o. male 1 year 4 months s/p anterior posterior lumbar interbody fusion L2-5 and posterior lumbar decompression L3-4 on 11/24/2022, now with an instrumentation construct extending up to T10 that was done at outside hospital August 2024 following a fall through a barn roof.       He is now seven months out from his extension of his fusion up to T10 at an outside hospital.  I would like to get a CT scan without contrast of his thoracic and lumbar spine to evaluate for pseudarthrosis or loosening of his instrumentation following his fracture.  We will follow up with him in 3 months.  If we see anything concerning on the CT scan, I will reach out to him.       We obtained his operative report.  At least based on the operative report, it does appear that the intention was arthrodesis with placement of bone graft.         In the presence of Porfirio Bristol, MD , I have taken down these notes, Jerrol Morelle, Scribe. April 08, 2024 9:39 AM

## 2024-04-19 ENCOUNTER — Encounter: Admit: 2024-04-19 | Discharge: 2024-04-19 | Payer: BLUE CROSS/BLUE SHIELD

## 2024-04-20 ENCOUNTER — Encounter: Admit: 2024-04-20 | Discharge: 2024-04-20 | Payer: BLUE CROSS/BLUE SHIELD

## 2024-04-20 ENCOUNTER — Ambulatory Visit: Admit: 2024-04-20 | Discharge: 2024-04-20 | Payer: BLUE CROSS/BLUE SHIELD

## 2024-04-21 ENCOUNTER — Encounter: Admit: 2024-04-21 | Discharge: 2024-04-21 | Payer: BLUE CROSS/BLUE SHIELD

## 2024-05-11 ENCOUNTER — Encounter: Admit: 2024-05-11 | Discharge: 2024-05-11 | Payer: BLUE CROSS/BLUE SHIELD

## 2024-05-11 NOTE — Progress Notes
 Pt hospitalized since last office visit: No    Health Maintenance Due   Topic Date Due    SHINGLES RECOMBINANT VACCINE (1 of 2) Never done    PHYSICAL (COMPREHENSIVE) EXAM  01/28/2024       There are no diagnoses linked to this encounter.    Labs not done:      Lab Frequency Next Occurrence   REQUEST FOR CARDIOLOGY APPOINTMENT Once 07/10/2022   REQUEST FOR CARDIOLOGY APPOINTMENT Once 05/11/2024   LIPID PROFILE Once 11/19/2023   REQUEST FOR CARDIOLOGY APPOINTMENT Once 11/11/2024   CBC AND DIFF Once 02/29/2024   BASIC METABOLIC PANEL Once 02/29/2024   HEMOGLOBIN A1C Once 02/29/2024   LIPID PROFILE Once 02/29/2024   LIVER FUNCTION PANEL Once 02/29/2024   MICROALB/CR RATIO-URINE RANDOM Once 02/29/2024   TSH WITH FREE T4 REFLEX Once 02/29/2024       MyChart message sent to patient on 05/11/24 Keller Patella, RN     Notes to provider:

## 2024-05-18 ENCOUNTER — Ambulatory Visit: Admit: 2024-05-18 | Discharge: 2024-05-19 | Payer: BLUE CROSS/BLUE SHIELD

## 2024-05-18 ENCOUNTER — Encounter: Admit: 2024-05-18 | Discharge: 2024-05-18 | Payer: BLUE CROSS/BLUE SHIELD

## 2024-05-19 ENCOUNTER — Ambulatory Visit: Admit: 2024-05-19 | Discharge: 2024-05-19 | Payer: BLUE CROSS/BLUE SHIELD

## 2024-05-19 ENCOUNTER — Encounter: Admit: 2024-05-19 | Discharge: 2024-05-19 | Payer: BLUE CROSS/BLUE SHIELD

## 2024-05-19 ENCOUNTER — Ambulatory Visit: Admit: 2024-05-19 | Discharge: 2024-05-20 | Payer: BLUE CROSS/BLUE SHIELD

## 2024-05-19 DIAGNOSIS — R931 Abnormal findings on diagnostic imaging of heart and coronary circulation: Secondary | ICD-10-CM

## 2024-05-19 DIAGNOSIS — F419 Anxiety disorder, unspecified: Secondary | ICD-10-CM

## 2024-05-19 DIAGNOSIS — I10 Essential (primary) hypertension: Secondary | ICD-10-CM

## 2024-05-19 DIAGNOSIS — E782 Mixed hyperlipidemia: Secondary | ICD-10-CM

## 2024-05-19 DIAGNOSIS — Z125 Encounter for screening for malignant neoplasm of prostate: Secondary | ICD-10-CM

## 2024-05-19 DIAGNOSIS — Z0000 Encounter for general adult medical examination without abnormal findings: Secondary | ICD-10-CM

## 2024-05-19 DIAGNOSIS — R0989 Other specified symptoms and signs involving the circulatory and respiratory systems: Secondary | ICD-10-CM

## 2024-05-19 DIAGNOSIS — E66811 Obesity (BMI 30.0-34.9): Secondary | ICD-10-CM

## 2024-05-19 DIAGNOSIS — K21 Gastroesophageal reflux disease with esophagitis without hemorrhage: Secondary | ICD-10-CM

## 2024-05-19 DIAGNOSIS — I2510 Atherosclerotic heart disease of native coronary artery without angina pectoris: Secondary | ICD-10-CM

## 2024-05-19 MED ORDER — CITALOPRAM 40 MG PO TAB
40 mg | ORAL_TABLET | Freq: Every day | ORAL | 3 refills | 90.00000 days | Status: AC
Start: 2024-05-19 — End: ?

## 2024-05-19 NOTE — Patient Instructions
 Get fasting labs few days before return to clinic in 4 months

## 2024-05-19 NOTE — Patient Instructions
 Follow-Up:    -Thank you for allowing [place] to participate in your care today. Your After Visit Summary is being completed by Rosalita Chessman, RN.    -We would like you to follow up in  6-8 months with Harrie Foreman, MD  -The schedule is released approximately 6-8 months in advance. You will be called by our scheduling department to make an appointment and you will also receive a notification via MyChart to self-schedule.  However, if you would like to call to make this appointment, please call [phone].    Changes From Today's Office Visit   No medication changes today.     Contacting our office:    -Business Hours: Monday-Friday, 8:00 am-4:30 pm (excluding Holidays).     -For medical questions or concerns, please send [place] a message through your MyChart account or call the [facility] team nursing triage line at [phone]. Please leave a detailed message with your name, date of birth, and reason for your call.  If your message is received before 3:30pm, every effort will be made to call you back the same day.  Please allow time for [place] to review your chart prior to call back.     -For medication refills please start by contacting your pharmacy. You can also send [place] a prescription question through your MyChart or call the nurse triage line above.     -Should you have an immediate need of the weekend/nights and holidays, please call our on-call triage line at [phone].    [facility] nursing team fax number: [phone]    -You may receive a survey in the upcoming weeks from [facility]. Your feedback is important to [place] and helps [place] continue to improve patient care and patient satisfaction.     -Please feel free to call our Financial Department at [phone] with any questions or concerns about estimated cost of testing or imaging ordered today. We are happy to provide CPT codes upon request.    Results & Testing Follow Up:    -Please allow 5-7 business days for the results of any testing to be reviewed. Please call our office if you have not heard from a nurse within this time frame.    -Should you choose to complete testing at an outside facility, please contact our office after completion of testing so that we can ensure that we have received results for your provider to review.    Lab and test results:  As a part of the CARES act, starting 03/22/2020, some results will be released to you via MyChart immediately and automatically.  You may see results before your provider sees them; however, your provider will review all these results and then they, or one of their team, will notify you of result information and recommendations.   Critical results will be addressed immediately, but otherwise, please allow [place] time to get back with you prior to you reaching out to [place] for questions.  This will usually take about 72 hours for labs and 5-7 days for procedure test results.

## 2024-05-19 NOTE — Progress Notes
 Answers submitted by the patient for this visit:  Other Symptom Questionnaire (Submitted on 05/12/2024)  Chief Complaint: Patient Reported Other  What topic(s) would you like to cover during your appointment?: Bi-Anual Check up  Please describe the issue(s) and history with the issue (location, severity, duration, symptoms, etc.). : -  What has been done so far to take care of the issue(s)?: -  What are your goals for this visit?: -    Date of Service: 05/19/2024    Jared Hebert is a 73 y.o. male. DOB: 12/02/1951   MRN#: [id number]    Subjective: follow up       Patient Reported Other  What topic(s) would you like to cover during your appointment?:  Semi-Annual Check Up  Please describe the issue(s) and history with the issue (location, severity, duration, symptoms, etc.).:  None  What has been done so far to take care of the issue(s)?:  -  What are your goals for this visit?:  -    Jared Hebert is 73 y.o. patient who presents to clinic for follow up. He established care 07/19/2019. He is a retired Engineer, civil (consulting) in [employer]. No smoking, no alcohol, no street drugs. No regular exercise. He has 4 children.     He was last seen in our clinic by me 12/2023.     He had a fall from a roof around 14 feet in 08/2023 and ended with three fractured thoracic vertebra. He was admitted to [facility] and had surgery at [facility] and they infused from T9-T12. He is doing well.     He follows with spine center and saw them 04/11/22. He had a back injection 04/24/22  He had back surgery 11/24/22:   Anterior /Posterior Lumbar two -lumbar Five decompression and fusion with infuse and allograft. (Left)   He had an ortho follow up 12/31/22 and 02/2023 and 05/2023 and 03/2024.     He had Rt should er surgery 11/2020, 04/2021 and again in 04/2021 at [facility].     He saw sleep clinic 05/2021    He saw Cardiology 09/16/21 and 11/2021:  He had inconclusive coronary CT study and stress test so he ended with cardiac cath a/23/23 which showed no critical stenosis. He saw cardiology 11/12/23.     He had left arm and left chest wall shingles in 10/2021.     He had upset stomach recently similar to when he GI bleed before. No blood in stool. He had a bleeding ulcer before so I advised him to talk to his outside GI doctor to get another EGD. His Hb dropped from 13.7 on 09/16/21 to 12.6 on 01/10/22   He had a normal repeat EGD 02/28/22 ( copied sent for scanning)    He was in clinic for low back pain for 6 weeks as above. 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   He was referred by cardiology to GI because of anemia. He did EGD and colonscopy 01/25/2020 and was found to have a non-bleeding gastric ulcer 15mm that was biopsied and is scheduled for repeat EGD in 2 months. Colonscopy was normal. Results of scopes are sent for scanning. I told him to stop all  NSAID and Mobic at this time. He had a repeat EGD 05/07/2020 by his outside GI doctor and was reported that gastric ulcer has healed.      His calcium score was 315. He saw cardiology 12/2019. He  had CTA chest which was normal but There was incidental cholelithiasis and a right renal lesion that will require ultrasound for further definition.     He had a normal stress test 11/09/2020  He failed oral iron so cardiology ordered IV Iron.     He had labs drawn 05/18/24    He had abdomen US  01/30/2020 which showed:  Small hypoechoic exophytic right renal nodule most compatible with a   hemorrhagic cyst   Cholelithiasis   Mild bladder wall thickening, nonspecific but most likely secondary to   bladder outlet stenosis or neurogenic bladder     He managed to lose intentionally 55 Lb over last 1 year by having ketogenic diet. He takes multivitamin daily.    He also struggles with depression but no intent to harm himself or others. His brother had depression and committed suicide in 2003.     He saw cardiology 05/19/24.     Past Medical History:    Abnormal cardiac function test    Anemia, unspecified    Arthritis    Cataract    Coronary artery disease    Coronary atherosclerosis    Degenerative disc disease, lumbar    Degenerative disc disease, thoracic    Depression    Displaced fracture of acromial process, right shoulder, initial encounter for closed fracture    Generalized headaches    GERD (gastroesophageal reflux disease)    Hearing loss    Hyperlipidemia    Hypertension    Hypertriglyceridemia    Joint pain    Joint stiffness    Nontraumatic complete tear of rotator cuff    Obesity    Sleep apnea    Spinal stenosis    Ulcer       Surgical History:   Procedure Laterality Date    VASECTOMY  1984    HX CATARACT REMOVAL Bilateral 02/2017    OS Toric    PARS PLANA MECHANICAL VITRECTOMY WITH  Kenolog Right 09/02/2018    Performed by Ezella Holly, MD at [facility] OR    PARS PLANA MECHANICAL VITRECTOMY Left 11/04/2018    Performed by Ezella Holly, MD at [facility] OR    ANGIOGRAPHY CORONARY ARTERY WITH LEFT HEART CATHETERIZATION N/A 01/13/2022    Performed by Adrien Alberta, MD at [facility] CATH LAB    POSSIBLE PERCUTANEOUS CORONARY STENT PLACEMENT WITH ANGIOPLASTY N/A 01/13/2022    Performed by Adrien Alberta, MD at [facility] CATH LAB    Anterior /Posterior Lumbar two -lumbar Five decompression and fusion with infuse and allograft. Left 11/24/2022    Performed by Porfirio Bristol, MD at [facility] OR    THORACIC FUSION  08/2023    ABDOMEN SURGERY  11/2022    Lumbar Fusion    CARDIAC CATHERIZATION      CARDIOVASCULAR STRESS TEST      ECHOCARDIOGRAM PROCEDURE      ELECTROCARDIOGRAM      HX ADENOIDECTOMY      HX ARTHROSCOPIC SURGERY      Knee Scope    HX JOINT REPLACEMENT  11/2020    Right Shoulder    HX TONSILLECTOMY      KNEE SURGERY  Left Knee Scope 4 yrs ago    LAMINECTOMY  11/2022    NASAL SURGERY      TWICE    SHOULDER ARTHROPLASTY Right 04/2021    Right Shoulder - Injury    SURGERY  11/2022.  08/2023.    UMBILICAL ARTERIAL CATH - BEDSIDE  Less than  a year    No Stents Placed       family history includes Arthritis in his father and mother; Back pain in his brother, father, and mother; Brain Cancer in his paternal aunt and paternal uncle; Cataract in his father and mother; Coronary Artery Disease in his father; Depression in his brother; Heart Disease in his father; Heart problem in his father; High Cholesterol in his father; Hypertension in his father; Joint Pain in his brother, father, and mother; Osteoporosis in his mother.    Social History     Socioeconomic History    Marital status: Married     Spouse name: Stana Ear   Tobacco Use    Smoking status: Former     Current packs/day: 0.00     Average packs/day: 1 pack/day for 39.4 years (39.4 ttl pk-yrs)     Types: Cigarettes, Pipe, Cigars     Start date: 08/04/1967     Quit date: 12/22/2006     Years since quitting: 17.4     Passive exposure: Past    Smokeless tobacco: Former     Types: Chew     Quit date: 12/22/2006    Tobacco comments:     15 + yrs since last smoked   Vaping Use    Vaping status: Never Used   Substance and Sexual Activity    Alcohol use: Yes     Alcohol/week: 14.0 standard drinks of alcohol     Types: 14 Cans of beer per week     Comment: 2 Beers each night prior to going to bed    Drug use: Never    Sexual activity: Yes     Partners: Female     Birth control/protection: Surgical       Social History     Tobacco Use    Smoking status: Former     Current packs/day: 0.00     Average packs/day: 1 pack/day for 39.4 years (39.4 ttl pk-yrs)     Types: Cigarettes, Pipe, Cigars     Start date: 08/04/1967     Quit date: 12/22/2006     Years since quitting: 17.4     Passive exposure: Past    Smokeless tobacco: Former     Types: Chew     Quit date: 12/22/2006    Tobacco comments:     15 + yrs since last smoked   Substance Use Topics    Alcohol use: Yes     Alcohol/week: 14.0 standard drinks of alcohol     Types: 14 Cans of beer per week     Comment: 2 Beers each night prior to going to bed        Review of Systems   Constitutional: Negative for chills, diaphoresis, fever, malaise/fatigue, night sweats, weight gain and weight loss.   HENT:  Negative for congestion, sore throat and stridor.    Eyes:  Negative for blurred vision.   Cardiovascular:  Negative for chest pain, orthopnea, palpitations and syncope.   Respiratory:  Negative for cough, shortness of breath, snoring and wheezing.    Skin:  Negative for rash.   Musculoskeletal:  Negative for back pain, joint pain, joint swelling, myalgias and neck pain.   Gastrointestinal:  Negative for abdominal pain, anorexia, change in bowel habit, diarrhea, nausea and vomiting.   Genitourinary:  Negative for dysuria, flank pain and hesitancy.   Neurological:  Negative for disturbances in coordination, excessive daytime sleepiness, dizziness, focal weakness, headaches, numbness, seizures, vertigo and weakness.  Psychiatric/Behavioral:  Negative for depression, hallucinations and memory loss. The patient is not nervous/anxious.    Allergic/Immunologic: Negative for hives.   All other systems reviewed and are negative.      Objective:      ascorbic acid (VITAMIN C) 500 mg tablet Take one tablet by mouth daily.    cetirizine (ZYRTEC) 10 mg tablet Take one tablet by mouth every morning.    citalopram (CELEXA) 40 mg tablet Take one tablet by mouth daily.    cyclobenzaprine (FLEXERIL) 5 mg tablet Take one tablet to two tablets by mouth daily as needed.    ezetimibe (ZETIA) 10 mg tablet TAKE 1 TABLET BY MOUTH EVERY DAY IN THE MORNING    fluticasone propionate (FLONASE) 50 mcg/actuation nasal spray, suspension Apply one spray to each nostril as directed at bedtime daily. Shake bottle gently before using.    folic acid (FOLVITE) 1 mg tablet TAKE 1 TABLET BY MOUTH EVERY DAY    gabapentin (NEURONTIN) 300 mg capsule Take one capsule by mouth every 8 hours.    GLUCOSAMINE SULFATE PO Take 1,500 mg by mouth every morning.    ketoconazole (NIZORAL) 2 % topical cream APPLY TO AFFECTED AREA TWICE DAILY FOR 14 DAYS    losartan (COZAAR) 25 mg tablet Take one tablet by mouth daily.    meloxicam (MOBIC) 15 mg tablet Take one tablet by mouth every morning.    omeprazole DR (PRILOSEC) 40 mg capsule TAKE ONE CAPSULE BY MOUTH DAILY BEFORE BREAKFAST.    rosuvastatin (CRESTOR) 20 mg tablet TAKE 1 TABLET BY MOUTH EVERY DAY    zolpidem (AMBIEN) 5 mg tablet TAKE 1 TABLET BY MOUTH AT BEDTIME AS NEEDED FOR SLEEP     Vitals:    05/19/24 1257   Resp: 10   PainSc: Zero   Height: 172.7 cm (5' 7.99)       Body mass index is 28.44 kg/m?Jared Hebert     Physical Exam  Vitals and nursing note reviewed.   Constitutional:       General: He is not in acute distress.     Appearance: He is well-developed. He is not diaphoretic.   HENT:      Head: Normocephalic and atraumatic.      Nose: Nose normal.   Eyes:      General: No scleral icterus.        Right eye: No discharge.         Left eye: No discharge.      Conjunctiva/sclera: Conjunctivae normal.      Pupils: Pupils are equal, round, and reactive to light.   Neck:      Thyroid: No thyromegaly.      Vascular: No JVD.      Trachea: No tracheal deviation.   Cardiovascular:      Rate and Rhythm: Normal rate and regular rhythm.      Heart sounds: Normal heart sounds. No murmur heard.     No friction rub.   Pulmonary:      Effort: Pulmonary effort is normal. No respiratory distress.      Breath sounds: Normal breath sounds. No wheezing or rales.   Abdominal:      General: There is no distension.      Palpations: Abdomen is soft. There is no mass.      Tenderness: There is no abdominal tenderness. There is no guarding or rebound.   Musculoskeletal:         General: Normal range of motion.  Cervical back: Normal range of motion and neck supple.   Lymphadenopathy:      Cervical: No cervical adenopathy.   Skin:     General: Skin is warm and dry.   Neurological:      Mental Status: He is alert and oriented to person, place, and time.      Cranial Nerves: No cranial nerve deficit.   Psychiatric:         Mood and Affect: Mood normal.         Behavior: Behavior normal.         Thought Content: Thought content normal.         Judgment: Judgment normal.          Assessment and Plan:      Left chest wall pain:  He used to wear a brace after his back surgery in 08/2023. He is oiff brace since 11/2023.  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 (Left)   He had an ortho follow up 12/31/22 and 03/2023 and 05/2023  He had a fall from a roof around 14 feet in 08/2023 and ended with three fractured thoracic vertebra. He was admitted to [facility] and had surgery at [facility] and they infused from T9-T12. He is doing well.   He saw spine clinic 03/2024.   Doing well  Stable    Rt shoulder injury at work:  He had surgery 11/2020, and 04/2021  Stable    Gastric ulcer:  He is in clinic for low back pain for 6 weeks as above. 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   He was referred by cardiology to GI because of anemia. He did EGD and colonscopy 01/25/2020 and was found to have a non-bleeding gastric ulcer 15mm that was biopsied and is scheduled for repeat EGD in 2 months. Colonscopy was normal. Results of scopes are sent for scanning. I told him to stop all  NSAID and Mobic at this time  He failed oral iron so cardiology ordered IV Iron.   He is scheduled to repeat EGD 05/07/20. I told him to restart Mobic with food if EGD is back to normal.   He had a repeat EGD 05/07/2020 by his outside GI doctor and was reported that gastric ulcer has healed.    Stable. Avoid NSAID  He had upset stomach recently similar to when he GI bleed before. No blood in stool. He had a bleeding ulcer before so I advised him to talk to his outside GI doctor to get another EGD. His Hb dropped from 13.7 on 09/16/21 to 12.6 on 01/10/22   He had a normal repeat EGD 02/28/22 ( copied sent for scanning)  CBC w diff    Lab Results  Component Value Date/Time    WBC 4.30 (L) 05/18/2024 10:52 AM    RBC 4.47 05/18/2024 10:52 AM    HGB 14.3 05/18/2024 10:52 AM    HCT 40.7 05/18/2024 10:52 AM    MCV 91.1 05/18/2024 10:52 AM    MCH 32.0 05/18/2024 10:52 AM    MCHC 35.2 05/18/2024 10:52 AM    RDW 13.0 05/18/2024 10:52 AM    PLTCT 249 05/18/2024 10:52 AM    MPV 6.5 (L) 05/18/2024 10:52 AM    Lab Results   Component Value Date/Time    NEUT 54.9 05/18/2024 10:52 AM    ANC 2.30 05/18/2024 10:52 AM    LYMA 28.5 05/18/2024 10:52 AM    ALC 1.20 05/18/2024 10:52 AM    MONA 11.4 05/18/2024 10:52 AM    AMC 0.50 05/18/2024 10:52 AM    EOSA 4.7 05/18/2024 10:52 AM    AEC 0.20 05/18/2024 10:52 AM    BASA 0.5 05/18/2024 10:52 AM    ABC 0.00 05/18/2024 10:52 AM        Shingles:  He had left arm and left chest wall shingles in 10/2021.   Resolved    Hypertension:  He is on HCTZ and [place]  BP Readings from Last 3 Encounters:   05/19/24 130/78   04/08/24 (!) 146/83   01/19/24 125/83   continue  He had stress test and echo in 02/2016 that were reported as normal and I reviewed in care Every where  He saw cardiology and had a stress test 10/2021  He saw cardiology 11/12/23  Stable  Doing well    Coronary calcification:  CT calcium scan 08/2019:  Quantitative Coronary Calcium Score:   Left main: 0   LAD: 114   LCX: 144   RCA: 5 7   Total calcium score: 315   He saw cardiology and had echo and normal stress test 10/2019 and has a follow up 12/2019 and 09/16/21  He saw Cardiology 09/16/21 and 11/2021:  He had inconclusive coronary CT study and stress test so he ended with cardiac cath a/23/23 which showed no critical stenosis.   Coronary artery disease. He had a coronary CTA in December 2022 which showed moderate to severe three-vessel disease. Subsequent cardiac catheterization was carried out with moderate CAD negative for significant disease by FFR.   Cardiology wants him to restart low dose Aspirin and I told him to do s with food.   Doing well    Renal mass:  His calcium score was 315. He saw cardiology 12/2019. He had CTA chest which was normal but There was incidental cholelithiasis and a right renal lesion that will require ultrasound for further definition.     He had a normal stress test 11/09/2020  He had abdomen US  01/30/2020 which showed:  Small hypoechoic exophytic right renal nodule most compatible with a   hemorrhagic cyst   Cholelithiasis   Mild bladder wall thickening, nonspecific but most likely secondary to   bladder outlet stenosis or neurogenic bladder   Stable  Doing well    Hyperlipidemia:  He is on Zocor 20mg  daily but did not take for 30 days. He had cramps with Lipitor.  We increased Zocor to 40mg  daily 07/19/2019  Cardiology switched him to Crestor 10mg  daily and Zetia  Plan on repeat labs in 3 months  Lab Results   Component Value Date    CHOL 118 05/18/2024    TRIG 76 05/18/2024    HDL 55 05/18/2024  LDL 52 05/18/2024    VLDL 16.1 05/18/2024    NONHDLCHOL 63 05/18/2024      Anxiety and Depression:   He had for 20 years  He is on Effexor and Remeron  He is not feeling that this combination is helping  He is homicidal or suicidal but his brother committed suicide in 2013  We referred to psychologist  I am referring this patient to the behavior health consultant for the following conditions:  Health Risk Behaviors:  Stress management  Mental Health Conditions:  Depression and Anxiety  We increased Celexa from 20 to 40mg  daily back in 08/2019  Refill Celexa 40mg  daily  He is doing better  Stable    History of sleep apnea:  He is not snoring after he lost weight  He struggles with staying sleep and Ambien did not help  He saw sleep clinic 05/2021  Refill    Weakness:  Normal labs and TSH and B12 level   He is on ketogenic diet    Hearing deficits:  He has hearing aids    Routine health maintenance:    Colonoscopy: 2014, 2021, normal  Tdap: 2015  Pneumovax: Prevnar 07/19/2019 and plan on pneumovax in a year  Influenza:09/2018, 10/2019, 11/2021, 12/01/23  Eye exam: 2019  Dental exam: 2019  AAA screening: 07/2019  He had COVID vaccine 10/2020, 01/20/22    Return to clinic in 4 months with labs    Total of 45 minutes were spent on the same day of the visit including preparing to see the patient, obtaining and/or reviewing separately obtained history, performing a medically appropriate examination and/or evaluation, counseling and educating the patient/family/caregiver, ordering medications, tests, or procedures, referring and communication with other health care professionals, documenting clinical information in the electronic or other health record, independently interpreting results and communicating results to the patient/family/caregiver, and care coordination.   I Counseled patient regarding low back pain, depression and weakness. We discussed safety during COVID19 pandemic.     Orders Placed This Encounter    CBC AND DIFF    BASIC METABOLIC PANEL    HEMOGLOBIN A1C    LIPID PROFILE    LIVER FUNCTION PANEL    MICROALB/CR RATIO-URINE RANDOM    TSH WITH FREE T4 REFLEX    PSA SCREEN today    citalopram (CELEXA) 40 mg tablet       Encounter Medications   Medications    citalopram (CELEXA) 40 mg tablet     Sig: Take one tablet by mouth daily.     Dispense:  90 tablet     Refill:  3     Future Appointments   Date Time Provider Department Center   07/22/2024  8:15 AM Porfirio Bristol, MD [facility]     Patient Instructions   Get fasting labs few days before return to clinic in 4 months

## 2024-05-19 NOTE — Progress Notes
 Date of Service: 05/19/2024    Jared Hebert is a 73 y.o. male.       HPI     I had the pleasure of seeing  Jared Hebert, for a six month follow up visit in the Cardiovascular Medicine clinic at [facility].      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      He was seen by Dr. Margie Sheller in the fall of 2023 for cardiac clearance prior to an anticipated lumbar surgery.   He initially had a screening calcium scan in September 2020 with a total Agatston score of  315.  He has had subsequent echocardiograms and stress test at [facility] in November 2020 which were unremarkable.  He had a coronary CTA on 12/03/2021 that was interpreted as moderate to severe three-vessel disease which led to a subsequent heart catheterization.  This was carried out on 01/13/22 which showed a 30%-40% LAD stenosis followed by a 50% midvessel stenosis. There was a 50% diagonal lesion.  The ramus intermediate had a 60% lesion.  FFR evaluation was completed on all concerning areas of stenosis, they were all negative. He was continued on aggressive risk management.     He was seen by Dr Margie Sheller in November without concerning symptoms.  Losartan was escalated to 50 mg daily for aggressive blood pressure management.    Jared Hebert presents for routine visit.  He remains doing well from a cardiac perspective.  [place], which keeps him busy mowing, weed eating and etc.         Vitals:    05/19/24 0811 05/19/24 0816   BP:  130/78   BP Source:  Arm, Left Upper   Pulse:  77   SpO2:  97%   O2 Device: None (Room air)    PainSc:  Zero   Weight:  84.8 kg (187 lb)   Height:  172.7 cm (5' 8)     Body mass index is 28.43 kg/m?Jared Hebert     Past Medical History  Patient Active Problem List    Diagnosis Date Noted    Left-sided chest pain 01/19/2024    Fall from height of greater than 3 feet 12/01/2023    Thoracic spine fracture (CMS-HCC) 12/01/2023     s/p 8/29 ORIF of a T12 fracture with instrumentation arthrodesis from T10 to the prior surgical construct at L3-4 with arthrodesis from T10-L4.      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 No significant valvular abnormalities.  3/13/7- [facility]): LVEF 66%. Exercise gated SPECT myocardial perfusion study appears within normal limits. Excellent functional capacity, attaining a workload of 10.2 METs.   09/21/19- Coronary  Calcium Score: Left main: 0 LAD: 114 LCX: 144 RCA: 5 7 Total calcium score: 315.  11/10/19- Echo: LVEF 60%. Mild left ventricular diastolic dysfunction. . No significant valvular abnormalities. No pericardial effusion.  11/10/19- [facility]: LVEF 60%. This study is normal and represents low probability for significant inducible myocardial ischemia. There are no perfusion abnormalities. All myocardial segments appear viable. Regional and global left ventricular function are normal.  High risk scintigraphic features are absent.   10/25/21- Echo: LVEF 60-65%.  LV wall thickness. No regional wall motion abnormality. Mild MR. CVP 3 mmHg.  Peak PA systolic pressure 24 mmHg.  10/25/21- [facility]: LVEF 60%. This myocardial pressure imaging study did not demonstrate any ischemia on the tomographic pattern, it was no evidence of fixed or reversible perfusion abnormalities. Transient ischemic dilatation was present. The pharmacologic ECG portion of the study is negative for ischemia.  12/04/21- CCTA: Moderate to severe proximal three-vessel coronary disease in the circumflex LAD and diagonal distributions, as well as the obtuse marginal branch of the nondominant circumflex. Consider computational FFR for verification and identification of the vessels with the most significant potential for significant hemodynamic obstruction  01/13/22- Left Heart Catheterization: Moderate nonobstructive coronary artery disease involving the mid left anterior descending, ramus intermedius, and the mid right coronary artery. LAD,  proximal long segment of 30% to 40% stenosis, mid LAD has a focal area of 50% to 60% stenosis.  This is a type 3 LAD.  The LAD gives rise to a diagonal branch, which has a 40% to 50% stenosis in the mid segment. Ramus Intermedius, proximal to mid segment of the ramus intermedius has a long segment of 60% to 70% stenosis. LCX 30% stenosis at the ostium. RCA, long segment of 50% to 60% stenosis in its mid segment.               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  He was started on Ambien 5 mg about 2020.  He currently takes it about half the nights of the week and does find it helpful.  He denies any side effects.      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   HENT: Negative.     Eyes: Negative.    Cardiovascular: Negative.    Respiratory: Negative.     Endocrine: Negative.    Hematologic/Lymphatic: Negative.    Skin: Negative.    Musculoskeletal: Negative.    Gastrointestinal: Negative.    Genitourinary: Negative.    Neurological: Negative.    Psychiatric/Behavioral: Negative.     Allergic/Immunologic: Negative.        Physical Exam  General Appearance: no acute distress  Skin:  warm & intact  HEENT: unremarkable  Neck Veins: neck veins are flat & not distended  Carotid Arteries: no bruits  Chest Inspection: chest is normal in appearance  Auscultation/Percussion: lungs clear to auscultation, no rales, rhonchi, or wheezing  Cardiac Rhythm: regular rhythm & normal rate  Cardiac Auscultation: Normal S1 & S2, no S3 or S4, no rub  Murmurs: no cardiac murmur  Extremities: no lower extremity edema; 2+ symmetric distal pulses  Abdominal Exam: soft, non-tender, no masses, bowel sounds normal  Liver & Spleen: no organomegaly  Neurologic Exam: oriented to time, place and person; no focal neurologic deficits  Psychiatric: Normal mood and affect.  Behavior is normal. Judgment and thought content normal.       Cardiovascular Studies  Preliminary EKG shows sinus rhythm rate 67 with a first-degree AV block, PR interval of 224 ms which is slightly progressed from a year ago.  There is no concerning ST/T wave changes.      Cardiovascular Health Factors  Vitals BP Readings from Last 3 Encounters:   05/19/24 130/78   04/08/24 (!) 146/83   01/19/24 125/83     Wt Readings from Last 3 Encounters:   05/19/24 84.8 kg (187 lb)   04/08/24 86.7 kg (191 lb 3.2 oz)   01/19/24 88.7 kg (195 lb 9.6 oz)     BMI Readings from Last 3 Encounters:   05/19/24 28.43 kg/m?   04/08/24 29.07 kg/m?   01/19/24 29.75 kg/m?      Smoking Social History     Tobacco Use Smoking Status Former    Current packs/day: 0.00    Average packs/day: 1 pack/day for 39.4 years (39.4 ttl pk-yrs)    Types: Cigarettes, Pipe, Cigars    Start date: 08/04/1967    Quit date: 12/22/2006    Years since quitting: 17.4    Passive exposure: Past   Smokeless Tobacco Former    Types: Chew    Quit date: 12/22/2006   Tobacco Comments    15 + yrs since last smoked      Lipid Profile Cholesterol   Date Value Ref Range Status   05/18/2024 118 <200 mg/dL Final     HDL   Date Value Ref Range Status   05/18/2024 55 >40 mg/dL Final     LDL   Date Value Ref Range Status   05/18/2024 52 <100 mg/dL Final     Triglycerides   Date Value Ref Range Status   05/18/2024 76 <150 mg/dL Final      Blood Sugar Hemoglobin A1C   Date Value Ref Range Status   05/18/2024 5.6 4.0 - 5.7 % Final     Comment:     The ADA recommends that most patients with type 1 and type 2 diabetes maintain an A1c level <7%.     Glucose   Date Value Ref Range Status   05/18/2024 114 (H) 70 - 100 mg/dL Final   16/09/9603 99 70 - 100 mg/dL Final   54/08/8118 95 70 - 100 MG/DL Final          Problems Addressed Today  Encounter Diagnoses   Name Primary?    Cardiovascular symptoms Yes    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     Coronary artery disease involving native coronary artery of native heart without angina pectoris     Hypertension, essential     Mixed hyperlipidemia        Assessment and Plan     1.  Primary hypertension.  His blood pressure remains well-controlled at 130/78 after increasing ARB last fall.  He will continue with his 50 mg dose for goal systolic less than 130, diastolics less than 80. His recent CMP remains unremarkable.     2.  Hyperlipidemia.  Jared Hebert  had a repeat lipid panel yesterday which showed goal LDL of 52.  He will continue with rosuvastatin 20 mg in addition to Zetia 10 mg.    3.  Coronary artery disease.  He had a coronary CTA in December 2022 which showed moderate to severe three-vessel disease. Subsequent cardiac catheterization was carried out with moderate CAD negative for significant disease by FFR.  He remains doing well without progressive symptoms.  Aspirin was discontinued approximately 4 years ago in the setting of a gastric ulcer related to meloxicam, NSAIDs and aspirin.  Meloxicam was reinitiated.  Will need to consider reinitiation of low-dose aspirin in the future.      Since he remains asymptomatic I did not feel repeat images is warranted at this time.  He is to follow-up with Dr. Margie Sheller in 6 months at the [facility] clinic which is close to his home.      Thank you for allowing me to participate in Jared Hebert care.  Please feel free to contact me if I can be of further assistance.    Emil Harada PA-c             Current Medications (including today's revisions)   ascorbic acid (VITAMIN C) 500 mg tablet Take one tablet by mouth daily.    cetirizine (ZYRTEC) 10 mg tablet Take one tablet by mouth every morning.    citalopram (CELEXA) 40 mg tablet Take one-half tablet by mouth daily.    cyclobenzaprine (FLEXERIL) 5 mg tablet Take one tablet to two tablets by mouth daily as needed.    ezetimibe (ZETIA) 10 mg tablet TAKE 1 TABLET BY MOUTH EVERY DAY IN THE MORNING    fluticasone propionate (FLONASE) 50 mcg/actuation nasal spray, suspension Apply one spray to each nostril as directed at bedtime daily. Shake bottle gently before using.    folic acid (FOLVITE) 1 mg tablet TAKE 1 TABLET BY MOUTH EVERY DAY    gabapentin (NEURONTIN) 300 mg capsule Take one capsule by mouth every 8 hours.    GLUCOSAMINE SULFATE PO Take 1,500 mg by mouth every morning.    ketoconazole (NIZORAL) 2 % topical cream APPLY TO AFFECTED AREA TWICE DAILY FOR 14 DAYS    losartan (COZAAR) 25 mg tablet Take one tablet by mouth daily.    meloxicam (MOBIC) 15 mg tablet Take one tablet by mouth every morning.    omeprazole DR (PRILOSEC) 40 mg capsule TAKE ONE CAPSULE BY MOUTH DAILY BEFORE BREAKFAST.    rosuvastatin (CRESTOR) 20 mg tablet TAKE 1 TABLET BY MOUTH EVERY DAY    zolpidem (AMBIEN) 5 mg tablet TAKE 1 TABLET BY MOUTH AT BEDTIME AS NEEDED FOR SLEEP

## 2024-05-21 ENCOUNTER — Encounter: Admit: 2024-05-21 | Discharge: 2024-05-21 | Payer: BLUE CROSS/BLUE SHIELD

## 2024-06-02 ENCOUNTER — Encounter: Admit: 2024-06-02 | Discharge: 2024-06-02 | Payer: BLUE CROSS/BLUE SHIELD

## 2024-06-02 MED ORDER — EZETIMIBE 10 MG PO TAB
10 mg | ORAL_TABLET | Freq: Every morning | ORAL | 3 refills | 90.00000 days | Status: AC
Start: 2024-06-02 — End: ?

## 2024-06-02 NOTE — Telephone Encounter
 LOV: 05/19/24  NOV: Due around December 2026  Last script sent: 05/25/23  Last labs: 05/18/24     Refilling per protocol.

## 2024-06-22 ENCOUNTER — Encounter: Admit: 2024-06-22 | Discharge: 2024-06-22 | Payer: BLUE CROSS/BLUE SHIELD

## 2024-06-22 DIAGNOSIS — Z981 Arthrodesis status: Principal | ICD-10-CM

## 2024-06-26 ENCOUNTER — Encounter: Admit: 2024-06-26 | Discharge: 2024-06-26 | Payer: BLUE CROSS/BLUE SHIELD

## 2024-07-02 ENCOUNTER — Encounter: Admit: 2024-07-02 | Discharge: 2024-07-02 | Payer: BLUE CROSS/BLUE SHIELD

## 2024-07-15 ENCOUNTER — Encounter: Admit: 2024-07-15 | Discharge: 2024-07-15 | Payer: BLUE CROSS/BLUE SHIELD

## 2024-07-22 ENCOUNTER — Ambulatory Visit: Admit: 2024-07-22 | Discharge: 2024-07-22 | Payer: BLUE CROSS/BLUE SHIELD

## 2024-07-22 ENCOUNTER — Encounter: Admit: 2024-07-22 | Discharge: 2024-07-22 | Payer: BLUE CROSS/BLUE SHIELD

## 2024-07-26 ENCOUNTER — Encounter: Admit: 2024-07-26 | Discharge: 2024-07-26 | Payer: BLUE CROSS/BLUE SHIELD

## 2024-08-15 ENCOUNTER — Encounter: Admit: 2024-08-15 | Discharge: 2024-08-15 | Payer: BLUE CROSS/BLUE SHIELD

## 2024-08-15 DIAGNOSIS — Z981 Arthrodesis status: Principal | ICD-10-CM

## 2024-08-15 NOTE — Patient Instructions
 It was a pleasure seeing you in clinic today.    Tamera Falco BSN, RN  Clinical Nurse Coordinator  Dr. Melodie Spry T. Bunch  [facility]  [address], [address]  [place], [place] [address]  Phone: [phone]  Fax [phone]   Scheduling [phone]  [email or web address]    We appreciate the interest in your health.  The physician reviews both the radiology reports and the imaging independently and will contact you if there are any emergent findings.  If you would like, incidental and chronic changes seen in your images can be reviewed with Dr. Maryjane Snider at a follow up appointment.  At that appointment, the radiology report and terminology used in that report can also be explained. Please call our Schedule line if you would like to follow up.

## 2024-08-19 ENCOUNTER — Encounter: Admit: 2024-08-19 | Discharge: 2024-08-19 | Payer: BLUE CROSS/BLUE SHIELD

## 2024-08-19 ENCOUNTER — Ambulatory Visit: Admit: 2024-08-19 | Discharge: 2024-08-19 | Payer: BLUE CROSS/BLUE SHIELD

## 2024-08-19 DIAGNOSIS — S22089D Unspecified fracture of T11-T12 vertebra, subsequent encounter for fracture with routine healing: Secondary | ICD-10-CM

## 2024-08-19 DIAGNOSIS — M546 Pain in thoracic spine: Principal | ICD-10-CM

## 2024-08-19 DIAGNOSIS — T84498A Other mechanical complication of other internal orthopedic devices, implants and grafts, initial encounter: Secondary | ICD-10-CM

## 2024-08-19 DIAGNOSIS — Z981 Arthrodesis status: Secondary | ICD-10-CM

## 2024-08-19 NOTE — Progress Notes
 [facility]  Follow - Up Visit  Subjective     REASON FOR VISIT   Pain of the Middle Back    SUBJECTIVE      Jared Hebert is a 73 year old male with a history of T11 fracture and posterior fixation from T9 to L1 in Aug 2024 done at [facility], who presents with recurrent mid thoracic left sided back pain and concerns about hardware issues.    In August 2024, he sustained a fall from a barn roof resulting in a T11 fracture, which was treated with posterior fixation from T9 to L1. Since then, he has experienced intermittent back pain. Initially, the pain worsened Jul 22 2024 but was not severe enough to require intervention at that time. Earlier this month, he noted that the pain resolved for a period but then recurred. He hears a noise in his back when straightening up or bending forward slightly, which he suspects may be related to the hardware. The noise occurs almost all the time and can be heard with a stethoscope. He believes the noise might be due to a clamp loosening and sliding of the rod.    The pain is positional, worsening when the hardware shifts, but he can adjust his position to alleviate it. The pain is localized to the mid-back, towards the upper third of the incision line, and is almost always on the left side. No radiating pain to the rib cage or legs.    He recalls being informed that his pedicles were small, which made the insertion of pedicle screws challenging, potentially increasing the risk of screw fracture. He is concerned about the possibility of hardware loosening or fracture.             PHYSICAL EXAM   Blood pressure 138/74, pulse 75, temperature 36.3 ?C (97.3 ?F), temperature source Temporal, height 172.7 cm (5' 8), weight 86.3 kg (190 lb 3.2 oz), SpO2 99%.  Body mass index is 28.92 kg/m?SABRA  Oswestry Total Score:: (Patient-Rptd) 16  Pain Score: Two      ABDOMEN: No abdominal tenderness.  SKIN: Incisions well healed, no open wounds. Constitutional: Alert, NAD  Head: Atraumatic  Eyes: EOMI  Respiratory: Unlabored breathing  Cardiovascular: Regular rate  Skin: No rashes or open wounds appreciated on back  Musculoskeletal: Strength stable  Neurologic: Sensation stable    Surgical incisions are healed.  There is no drainage, erythema, or warmth.  No open wounds. No tenderness to palpation over incision line.    RADIOGRAPHS     SCOLIOSIS EOS AP/LAT  Narrative: Exam: SCOLIOSIS EOS AP/LAT    CLINICAL INDICATION: 73 years. S/p spinal fusion.    COMPARISON: 07/22/2024, SCOLIOSIS EOS AP/LAT.  Impression: 1.  In keeping with prior numbering there is posterior instrumented fusion T9-L5 with anterior column fusion L2-L5.    2.  There is a displaced set screw posterior to the rods at T9-10 on lateral view with additional metallic densities overlying the far left paraspinal soft tissues posteriorly possibly additional displaced set screws on AP view. Apparent fracture of at least one of the pedicle screws at T10.    3.  Cement augmentation T10 and L1. Chronic wedging of T11 is unchanged.    4.  The lower cervical spine is partially obscured by overlying structures. Moderate disc disease in the thoracic spine. Reversed right shoulder arthroplasty.     Finalized by Franky Daring, M.D. on 08/19/2024 9:12 AM. Dictated by Franky Daring, M.D.  on 08/19/2024 9:03 AM.       EOS scoliosis films ordered and reviewed from today, there is a set cap that has loosened and migrated, there is a fracture of a T10 screw seen on the lateral imaging.        RADIOLOGY  X-ray: Hardware failure              ASSESSMENT / PLAN       Status post T11 fracture with T9-L1 posterior spinal fixation and possible hardware failure (loosening/fracture)  Possible hardware failure suspected due to noise and intermittent pain, potentially from a set cap that may have come off or a screw fracture. Hardware failure could be due to small pedicles, increasing the likelihood of screw fracture. No radiating pain or open wounds. Pain is positional and alleviated by adjusting posture.  - Order CT thoracic and CT lumbar spine to assess hardware status and potential failure.  - Updated Dr. Christel with hardware loosening seen on xray, confirmed CT of thoracic and lumbar spine need.   Potentially may remove hardware for fracture and see how patient does vs a revision.  Will see patient back in clinic to review CT results once completed.    Back pain related to spinal hardware  Intermittent positional back pain related to spinal hardware, resolving and recurring. No radiating pain or tenderness on examination. Pain is not severe enough to warrant immediate intervention, and he prefers to delay any surgical intervention until after fall activities are completed.  - Monitor pain and adjust activities as needed.             1. Loosening of hardware in spine  CT L-SPINE WO CONTRAST    CT T-SPINE WO CONTRAST      2. Pain in thoracic spine  CT L-SPINE WO CONTRAST    CT T-SPINE WO CONTRAST      3. Closed fracture of twelfth thoracic vertebra with routine healing, unspecified fracture morphology, subsequent encounter  CT L-SPINE WO CONTRAST    CT T-SPINE WO CONTRAST      4. S/P lumbar spinal arthrodesis  CT L-SPINE WO CONTRAST    CT T-SPINE WO CONTRAST             Alexsis Vicci, APRN, FNP-C   Nurse Practitioner-Spine Surgery    NP to Dr. Fonda Christel, MD  Office Phone: [phone]

## 2024-08-25 ENCOUNTER — Encounter: Admit: 2024-08-25 | Discharge: 2024-08-25 | Payer: BLUE CROSS/BLUE SHIELD

## 2024-08-25 ENCOUNTER — Ambulatory Visit: Admit: 2024-08-25 | Discharge: 2024-08-25 | Payer: BLUE CROSS/BLUE SHIELD

## 2024-08-29 ENCOUNTER — Encounter: Admit: 2024-08-29 | Discharge: 2024-08-29 | Payer: BLUE CROSS/BLUE SHIELD

## 2024-09-23 ENCOUNTER — Encounter: Admit: 2024-09-23 | Discharge: 2024-09-23 | Payer: BLUE CROSS/BLUE SHIELD

## 2024-09-27 ENCOUNTER — Encounter: Admit: 2024-09-27 | Discharge: 2024-09-27 | Payer: BLUE CROSS/BLUE SHIELD

## 2024-09-27 MED ORDER — LOSARTAN 25 MG PO TAB
25 mg | ORAL_TABLET | Freq: Every day | ORAL | 3 refills | 90.00000 days | Status: AC
Start: 2024-09-27 — End: ?

## 2024-09-27 NOTE — Telephone Encounter
 1. Received e-request from pharmacy requesting new Rx for   Requested Prescriptions     Pending Prescriptions Disp Refills    losartan  (COZAAR ) 25 mg tablet [Pharmacy Med Name: LOSARTAN  POTASSIUM 25 MG TAB] 90 tablet 3     Sig: TAKE 1 TABLET BY MOUTH EVERY DAY   .    2. Last Rx written: 11/16/2023  (#90 tablet x0) by PCP    3. Last Gen Med Visit: in person  on 05/19/2024    4. Next Appt due: Provider recommended return date from last office visit:   Return in about 4 months    Future Appointments   Date Time Provider Department Center   10/21/2024  8:45 AM Christel Fonda DASEN, MD [facility]   10/21/2024 10:40 AM Al-Hihi, Jamal HERO, MD [facility]   01/11/2025  4:00 PM Oneita Dorn LABOR, MD [facility] Exam       5. All protocol criteria met? Yes, last office visit assessment and plan reviewed.     Reason Protocol Failed: N/A    Courtesy Refills: N/A    Patient due for an office visit in 1,2, or 3 months 90 day supply, 0 refills   Patient due for an office visit in 4,5, or 6 months 90 day supply, 1 refill   Patient due for an office visit in 7,8, or 9 months 90 day supply, 2 refills   Patient due for an office visit in 10,11, or 12 months 90 day supply, 3 refills   Jared Hebert

## 2024-10-04 ENCOUNTER — Encounter: Admit: 2024-10-04 | Discharge: 2024-10-04 | Payer: BLUE CROSS/BLUE SHIELD

## 2024-10-04 DIAGNOSIS — Z981 Arthrodesis status: Secondary | ICD-10-CM

## 2024-10-04 DIAGNOSIS — T84498A Other mechanical complication of other internal orthopedic devices, implants and grafts, initial encounter: Principal | ICD-10-CM

## 2024-10-17 ENCOUNTER — Encounter: Admit: 2024-10-17 | Discharge: 2024-10-17 | Payer: BLUE CROSS/BLUE SHIELD

## 2024-10-21 ENCOUNTER — Ambulatory Visit: Admit: 2024-10-21 | Discharge: 2024-10-22 | Payer: BLUE CROSS/BLUE SHIELD

## 2024-10-21 ENCOUNTER — Encounter: Admit: 2024-10-21 | Discharge: 2024-10-21 | Payer: BLUE CROSS/BLUE SHIELD

## 2024-10-21 ENCOUNTER — Ambulatory Visit: Admit: 2024-10-21 | Discharge: 2024-10-21 | Payer: BLUE CROSS/BLUE SHIELD

## 2024-10-21 VITALS — BP 137/76 | HR 63 | Temp 98.60000°F | Ht 68.0 in | Wt 190.0 lb

## 2024-10-21 DIAGNOSIS — Z981 Arthrodesis status: Secondary | ICD-10-CM

## 2024-10-21 DIAGNOSIS — S32009K Unspecified fracture of unspecified lumbar vertebra, subsequent encounter for fracture with nonunion: Secondary | ICD-10-CM

## 2024-10-21 DIAGNOSIS — E782 Mixed hyperlipidemia: Secondary | ICD-10-CM

## 2024-10-21 DIAGNOSIS — Z01818 Encounter for other preprocedural examination: Secondary | ICD-10-CM

## 2024-10-21 DIAGNOSIS — I10 Essential (primary) hypertension: Secondary | ICD-10-CM

## 2024-10-21 DIAGNOSIS — Z419 Encounter for procedure for purposes other than remedying health state, unspecified: Principal | ICD-10-CM

## 2024-10-21 DIAGNOSIS — K21 Gastroesophageal reflux disease with esophagitis without hemorrhage: Secondary | ICD-10-CM

## 2024-10-21 DIAGNOSIS — T84498A Other mechanical complication of other internal orthopedic devices, implants and grafts, initial encounter: Secondary | ICD-10-CM

## 2024-10-21 DIAGNOSIS — F419 Anxiety disorder, unspecified: Principal | ICD-10-CM

## 2024-10-21 MED ORDER — KETOCONAZOLE 2 % TP SHAM
TOPICAL | 3 refills | 30.00000 days | Status: AC
Start: 2024-10-21 — End: ?

## 2024-10-21 NOTE — Progress Notes [1]
 Answers submitted by the patient for this visit:  Other Symptom Questionnaire (Submitted on 10/17/2024)  Chief Complaint: Patient Reported Other  What topic(s) would you like to cover during your appointment?: Semi-Annual checkup - Chronic Scalp Itch  Please describe the issue(s) and history with the issue (location, severity, duration, symptoms, etc.). : Scalp Itch has become chronic over the past 6 months.  Small dry scaly patches that itch.  If scratched off of scalp, it will leave a red sore area that forms a slow healing scab.  What has been done so far to take [facility] of the issue(s)?: Many OTC shampoo and conditioners that claim to help with scalp itch and scalp psoriasis.  None of which have been helpful.  What are your goals for this visit?: A good checkup and assistance with scalp itch.    Date of Service: 10/21/2024    Jared Hebert is a 74 y.o. [facility]. DOB: 16-Jan-1951   MRN#: [id number]    Subjective: follow up       Patient Reported Other  What topic(s) would you like to cover during your appointment?:  Semi-Annual Check Up  Please describe the issue(s) and history with the issue (location, severity, duration, symptoms, etc.).:  None  What has been done so far to take [facility] of the issue(s)?:  -  What are your goals for this visit?:  -    Jared Hebert is 73 y.o. patient who presents to clinic for follow up. Jared Hebert 07/19/2017. Jared Hebert, Jared Hebert. No smoking, no alcohol, no street drugs. No regular exercise. Jared has 4 children.     Jared was last seen in our clinic by me 04/2024.     Jared had a fall from a roof around 14 feet in 08/2023 and ended with three fractured thoracic vertebra. Jared was admitted to [facility] and had surgery at [facility] and they infused from T9-T12. Jared is doing well.     Jared follows with spine center and saw them 04/11/22. Jared had a back injection 04/24/22  Jared had back surgery 11/24/22:   Anterior /Posterior Lumbar two -lumbar Five decompression and fusion with infuse and allograft. (Left)   Jared had an ortho follow up 12/31/22 and 02/2023 and 05/2023 and 03/2024 and 07/2024.     Jared had Rt should er surgery 11/2020, 04/2021 and again in 04/2021 at [facility].     Jared saw sleep clinic 05/2021    Jared saw Cardiology 09/16/21 and 11/2021:  Jared had inconclusive coronary CT study and stress test so Jared ended with cardiac cath a/23/23 which showed no critical stenosis. Jared saw cardiology 11/12/23.     Jared had left arm and left chest wall shingles in 10/2021.     Jared had upset stomach recently similar to when Jared GI bleed before. No blood in stool. Jared had a bleeding ulcer before so I advised him to talk to his outside GI doctor to get another EGD. His Hb dropped from 13.7 on 09/16/21 to 12.6 on 01/10/22   Jared had a normal repeat EGD 02/28/22 ( copied sent for scanning)    Jared was in clinic for low back pain for 6 weeks as above. 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   Jared was referred by cardiology to GI because of anemia. Jared did EGD and colonscopy 01/25/2020 and was found to have a non-bleeding gastric ulcer 15mm that  was biopsied and is scheduled for repeat EGD in 2 months. Colonscopy was normal. Results of scopes are sent for scanning. I told him to stop all  NSAID and Mobic  at this time. Jared had a repeat EGD 05/07/2020 by his outside GI doctor and was reported that gastric ulcer has healed.      His calcium score was 315. Jared saw cardiology 12/2019. Jared had CTA chest which was normal but There was incidental cholelithiasis and a right renal lesion that will require ultrasound for further definition.     Jared had a normal stress test 11/09/2020  Jared failed oral iron  so cardiology ordered IV Iron .     Jared had labs drawn 05/18/24    Jared had abdomen US  01/30/2020 which showed:  Small hypoechoic exophytic right renal nodule most compatible with a   hemorrhagic cyst   Cholelithiasis   Mild bladder wall thickening, nonspecific but most likely secondary to   bladder outlet stenosis or neurogenic bladder     Jared managed to lose intentionally 55 Lb over last 1 year by having ketogenic diet. Jared takes multivitamin daily.    Jared also struggles with depression but no intent to harm himself or others. His brother had depression and committed suicide in 2003.     Jared saw cardiology 05/19/24.     Past Medical History:    Abnormal cardiac function test    Anemia, unspecified    Arthritis    Cataract    Coronary artery disease    Coronary atherosclerosis    Degenerative disc disease, lumbar    Degenerative disc disease, thoracic    Depression    Displaced fracture of acromial process, right shoulder, initial encounter for closed fracture    Generalized headaches    GERD (gastroesophageal reflux disease)    Hearing loss    Hyperlipidemia    Hypertension    Hypertriglyceridemia    Joint pain    Joint stiffness    Nontraumatic complete tear of rotator cuff    Obesity    Sleep apnea    Spinal stenosis    Ulcer       Surgical History:   Procedure Laterality Date    VASECTOMY  1984    HX CATARACT REMOVAL Bilateral 02/2017    OS Toric    PARS PLANA MECHANICAL VITRECTOMY WITH  Kenolog Right 09/02/2018    Performed by Lindajo Ronal DASEN, MD at [facility] OR    PARS PLANA MECHANICAL VITRECTOMY Left 11/04/2018    Performed by Lindajo Ronal DASEN, MD at [facility] OR    ANGIOGRAPHY CORONARY ARTERY WITH LEFT HEART CATHETERIZATION N/A 01/13/2022    Performed by Lannis Maude SAILOR, MD at [facility] CATH LAB    POSSIBLE PERCUTANEOUS CORONARY STENT PLACEMENT WITH ANGIOPLASTY N/A 01/13/2022    Performed by Lannis Maude SAILOR, MD at [facility] CATH LAB    Anterior /Posterior Lumbar two -lumbar Five decompression and fusion with infuse and allograft. Left 11/24/2022    Performed by Christel Fonda DASEN, MD at [facility] OR    THORACIC FUSION  08/2023    ABDOMEN SURGERY  11/2022    Lumbar Fusion    CARDIAC CATHERIZATION      CARDIOVASCULAR STRESS TEST      ECHOCARDIOGRAM PROCEDURE      ELECTROCARDIOGRAM      HX ADENOIDECTOMY      HX ARTHROSCOPIC SURGERY      Knee Scope    HX JOINT REPLACEMENT  11/2020    Right Shoulder  HX TONSILLECTOMY      KNEE SURGERY  Left Knee Scope 4 yrs ago    LAMINECTOMY  11/2022    NASAL SURGERY      TWICE    SHOULDER ARTHROPLASTY Right 04/2021    Right Shoulder - Injury    SURGERY  11/2022.  08/2023.    Right Shoulder    UMBILICAL ARTERIAL CATH - BEDSIDE  Less than a year    No Stents Placed       family history includes Arthritis in his father and mother; Back pain in his brother, father, and mother; Brain Cancer in his paternal aunt and paternal uncle; Cataract in his father and mother; Coronary Artery Disease in his father; Depression in his brother; Heart Disease in his father; Heart problem in his father; High Cholesterol in his father; Hypertension in his father; Joint Pain in his brother, father, and mother; Osteoporosis in his mother.    Social History     Socioeconomic History    Marital status: Married     Spouse name: Rock   Tobacco Use    Smoking status: Former     Current packs/day: 0.00     Average packs/day: 1 pack/day for 39.4 years (39.4 ttl pk-yrs)     Types: Cigarettes, Pipe, Cigars     Start date: 08/04/1967     Quit date: 12/22/2006     Years since quitting: 17.8     Passive exposure: Past    Smokeless tobacco: Former     Types: Chew     Quit date: 12/22/2006    Tobacco comments:     15 + yrs since last smoked   Vaping Use    Vaping status: Never Used   Substance and Sexual Activity    Alcohol use: Yes     Alcohol/week: 2.0 - 6.0 standard drinks of alcohol     Types: 2 - 6 Standard drinks or equivalent per week     Comment: 2 Beers each night prior to going to bed    Drug use: Never    Sexual activity: Not Currently     Partners: Female     Birth control/protection: Surgical       Social History     Tobacco Use    Smoking status: Former     Current packs/day: 0.00     Average packs/day: 1 pack/day for 39.4 years (39.4 ttl pk-yrs)     Types: Cigarettes, Pipe, Cigars     Start date: 08/04/1967     Quit date: 12/22/2006     Years since quitting: 17.8 Passive exposure: Past    Smokeless tobacco: Former     Types: Chew     Quit date: 12/22/2006    Tobacco comments:     15 + yrs since last smoked   Substance Use Topics    Alcohol use: Yes     Alcohol/week: 2.0 - 6.0 standard drinks of alcohol     Types: 2 - 6 Standard drinks or equivalent per week     Comment: 2 Beers each night prior to going to bed        Review of Systems   Constitutional: Negative for chills, diaphoresis, fever, malaise/fatigue, night sweats, weight gain and weight loss.   HENT:  Negative for congestion, sore throat and stridor.    Eyes:  Negative for blurred vision.   Cardiovascular:  Negative for chest pain, orthopnea, palpitations and syncope.   Respiratory:  Negative for cough, shortness of breath,  snoring and wheezing.    Skin:  Negative for rash.   Musculoskeletal:  Negative for back pain, joint pain, joint swelling, myalgias and neck pain.   Gastrointestinal:  Negative for abdominal pain, anorexia, change in bowel habit, diarrhea, nausea and vomiting.   Genitourinary:  Negative for dysuria, flank pain and hesitancy.   Neurological:  Negative for disturbances in coordination, excessive daytime sleepiness, dizziness, focal weakness, headaches, numbness, seizures, vertigo and weakness.   Psychiatric/Behavioral:  Negative for depression, hallucinations and memory loss. The patient is not nervous/anxious.    Allergic/Immunologic: Negative for hives.   All other systems reviewed and are negative.      Objective:      ascorbic acid (VITAMIN C) 500 mg tablet Take one tablet by mouth daily.    cetirizine (ZYRTEC) 10 mg tablet Take one tablet by mouth every morning.    citalopram  (CELEXA ) 40 mg tablet Take one tablet by mouth daily.    cyclobenzaprine  (FLEXERIL ) 5 mg tablet Take one tablet to two tablets by mouth daily as needed.    ezetimibe  (ZETIA ) 10 mg tablet TAKE 1 TABLET BY MOUTH EVERY DAY IN THE MORNING    fluticasone propionate (FLONASE) 50 mcg/actuation nasal spray, suspension Apply one spray to each nostril as directed at bedtime daily. Shake bottle gently before using.    folic acid  (FOLVITE ) 1 mg tablet TAKE 1 TABLET BY MOUTH EVERY DAY    gabapentin  (NEURONTIN ) 300 mg capsule Take one capsule by mouth every 8 hours.    GLUCOSAMINE SULFATE PO Take 1,500 mg by mouth every morning.    ketoconazole  (NIZORAL ) 2 % topical cream APPLY TO AFFECTED AREA TWICE DAILY FOR 14 DAYS    ketoconazole  (NIZORAL ) 2 % topical shampoo Apply topically to affected area of damp skin, lather, leave on 5 minutes, and rinse. Repeat daily for 10 days  Indications: seborrheic dermatitis    losartan  (COZAAR ) 25 mg tablet TAKE 1 TABLET BY MOUTH EVERY DAY    meloxicam  (MOBIC ) 15 mg tablet Take one tablet by mouth every morning.    omeprazole  DR (PRILOSEC) 40 mg capsule TAKE ONE CAPSULE BY MOUTH DAILY BEFORE BREAKFAST.    rosuvastatin  (CRESTOR ) 20 mg tablet TAKE 1 TABLET BY MOUTH EVERY DAY    zolpidem  (AMBIEN ) 5 mg tablet TAKE 1 TABLET BY MOUTH AT BEDTIME AS NEEDED FOR SLEEP     Vitals:    10/21/24 1030   BP: 137/76   Pulse: 63   Temp: 37 ?C (98.6 ?F)   SpO2: 99%   PainSc: Three   Weight: 86.2 kg (190 lb)   Height: 172.7 cm (5' 8)       Body mass index is 28.89 kg/m?SABRA     Physical Exam  Vitals and nursing note reviewed.   Constitutional:       General: Jared is not in acute distress.     Appearance: Jared is well-developed. Jared is not diaphoretic.   HENT:      Head: Normocephalic and atraumatic.      Nose: Nose normal.   Eyes:      General: No scleral icterus.        Right eye: No discharge.         Left eye: No discharge.      Conjunctiva/sclera: Conjunctivae normal.      Pupils: Pupils are equal, round, and reactive to light.   Neck:      Thyroid: No thyromegaly.      Vascular: No JVD.  Trachea: No tracheal deviation.   Cardiovascular:      Rate and Rhythm: Normal rate and regular rhythm.      Heart sounds: Normal heart sounds. No murmur heard.     No friction rub.   Pulmonary:      Effort: Pulmonary effort is normal. No respiratory distress.      Breath sounds: Normal breath sounds. No wheezing or rales.   Abdominal:      General: There is no distension.      Palpations: Abdomen is soft. There is no mass.      Tenderness: There is no abdominal tenderness. There is no guarding or rebound.   Musculoskeletal:         General: Normal range of motion.      Cervical back: Normal range of motion and neck supple.   Lymphadenopathy:      Cervical: No cervical adenopathy.   Skin:     General: Skin is warm and dry.   Neurological:      Mental Status: Jared is alert and oriented to person, place, and time.      Cranial Nerves: No cranial nerve deficit.   Psychiatric:         Mood and Affect: Mood normal.         Behavior: Behavior normal.         Thought Content: Thought content normal.         Judgment: Judgment normal.          Assessment and Plan:      Left chest wall pain:  Jared used to wear a brace after his back surgery in 08/2023. Jared is oiff brace since 11/2023.  Jared has localized left chest wall pain with a mas palpated in his breast: Tenderness, firm at 11 o'clock position 2 cm from nipple  I will go ahead and order:  Chest x-ray and left rib view  Left diagnotic mammogram and left breast US   Resolved  Stable    Chronic low back pain for few weeks:  Jared went to chiroparcter and took NSAID and did home exercises  With his gastric ulcer, NO NSAID or MOBIC   Jared is seeing PT at [facility]  We referred to PM&R again  Jared had ablation  Jared saw spine clinic 05/2020 and 03/2022 and 04/2022  Jared had back surgery 11/24/22:   Anterior /Posterior Lumbar two -lumbar Five decompression and fusion with infuse and allograft. (Left)   Jared had an ortho follow up 12/31/22 and 03/2023 and 05/2023  Jared had a fall from a roof around 14 feet in 08/2023 and ended with three fractured thoracic vertebra. Jared was admitted to [facility] and had surgery at [facility] and they infused from T9-T12. Jared is doing well.   Jared saw spine clinic 07/2024 and 09/2024  Doing well  Stable  Observe    Rt shoulder injury at work:  Jared had surgery 11/2020, and 04/2021  Stable    Gastric ulcer:  Jared is in clinic for low back pain for 6 weeks as above. 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   Jared was referred by cardiology to GI because of anemia. Jared did EGD and colonscopy 01/25/2020 and was found to have a non-bleeding gastric ulcer 15mm that was biopsied and is scheduled for repeat EGD in 2 months. Colonscopy was normal. Results of scopes are sent for scanning. I told him to stop all  NSAID and Mobic  at this time  Jared failed oral iron  so cardiology ordered IV Iron .   Jared is scheduled to repeat EGD 05/07/20. I told him to restart Mobic  with food if EGD is back to normal.   Jared had a repeat EGD 05/07/2020 by his outside GI doctor and was reported that gastric ulcer has healed.    Stable. Avoid NSAID  Jared had upset stomach recently similar to when Jared GI bleed before. No blood in stool. Jared had a bleeding ulcer before so I advised him to talk to his outside GI doctor to get another EGD. His Hb dropped from 13.7 on 09/16/21 to 12.6 on 01/10/22   Jared had a normal repeat EGD 02/28/22 ( copied sent for scanning)  CBC w diff    Lab Results   Component Value Date/Time    WBC 4.30 (L) 05/18/2024 10:52 AM    RBC 4.47 05/18/2024 10:52 AM    HGB 14.3 05/18/2024 10:52 AM    HCT 40.7 05/18/2024 10:52 AM    MCV 91.1 05/18/2024 10:52 AM    MCH 32.0 05/18/2024 10:52 AM    MCHC 35.2 05/18/2024 10:52 AM    RDW 13.0 05/18/2024 10:52 AM    PLTCT 249 05/18/2024 10:52 AM    MPV 6.5 (L) 05/18/2024 10:52 AM    Lab Results   Component Value Date/Time    NEUT 54.9 05/18/2024 10:52 AM    ANC 2.30 05/18/2024 10:52 AM    LYMA 28.5 05/18/2024 10:52 AM    ALC 1.20 05/18/2024 10:52 AM    MONA 11.4 05/18/2024 10:52 AM    AMC 0.50 05/18/2024 10:52 AM    EOSA 4.7 05/18/2024 10:52 AM    AEC 0.20 05/18/2024 10:52 AM    BASA 0.5 05/18/2024 10:52 AM    ABC 0.00 05/18/2024 10:52 AM        Shingles:  Jared had left arm and left chest wall shingles in 10/2021.   Resolved    Hypertension:  Jared is on HCTZ and Losarten  BP Readings from Last 3 Encounters:   10/21/24 137/76   10/21/24 131/74   08/19/24 138/74   continue  Jared had stress test and echo in 02/2016 that were reported as normal and I reviewed in [facility] Every where  Jared saw cardiology and had a stress test 10/2021  Jared saw cardiology 11/12/23  Stable  Doing well  Controlled    Coronary calcification:  CT calcium scan 08/2019:  Quantitative Coronary Calcium Score:   Left main: 0   LAD: 114   LCX: 144   RCA: 5 7   Total calcium score: 315   Jared saw cardiology and had echo and normal stress test 10/2019 and has a follow up 12/2019 and 09/16/21  Jared saw Cardiology 09/16/21 and 11/2021:  Jared had inconclusive coronary CT study and stress test so Jared ended with cardiac cath a/23/23 which showed no critical stenosis.   Coronary artery disease. Jared had a coronary CTA in December 2022 which showed moderate to severe three-vessel disease. Subsequent cardiac catheterization was carried out with moderate CAD negative for significant disease by FFR.   Cardiology wants him to restart low dose Aspirin  and I told him to do s with food.   Doing well    Renal mass:  His calcium score was 315. Jared saw cardiology 12/2019. Jared had CTA chest which was normal but There was incidental cholelithiasis and a right renal lesion that will require ultrasound for further definition.  Jared had a normal stress test 11/09/2020  Jared had abdomen US  01/30/2020 which showed:  Small hypoechoic exophytic right renal nodule most compatible with a   hemorrhagic cyst   Cholelithiasis   Mild bladder wall thickening, nonspecific but most likely secondary to   bladder outlet stenosis or neurogenic bladder   Stable  Doing well    Hyperlipidemia:  Jared is on Zocor  20mg  daily but did not take for 30 days. Jared had cramps with Lipitor.  We increased Zocor  to 40mg  daily 07/19/2019  Cardiology switched him to Crestor  10mg  daily and Zetia   Plan on repeat labs in 3 months  Lab Results   Component Value Date    CHOL 118 05/18/2024    TRIG 76 05/18/2024    HDL 55 05/18/2024    LDL 52 05/18/2024    VLDL 84.7 05/18/2024    NONHDLCHOL 63 05/18/2024      Anxiety and Depression:   Jared had for 20 years  Jared is on Effexor and Remeron  Jared is not feeling that this combination is helping  Jared is homicidal or suicidal but his brother committed suicide in 2013  We referred to psychologist  I am referring this patient to the behavior health consultant for the following conditions:  Health Risk Behaviors:  Stress management  Mental Health Conditions:  Depression and Anxiety  We increased Celexa  from 20 to 40mg  daily back in 08/2019  Refill Celexa  40mg  daily  Jared is doing better  Stable    History of sleep apnea:  Jared is not snoring after Jared lost weight  Jared struggles with staying sleep and Ambien  did not help  Jared saw sleep clinic 05/2021  Refill    Weakness:  Normal labs and TSH and B12 level   Jared is on ketogenic diet    Hearing deficits:  Jared has hearing aids    Routine health maintenance:    Colonoscopy: 2014, 2021, normal  Tdap: 2015  Pneumovax: Prevnar 07/19/2019 and plan on pneumovax in a year  Influenza:09/2018, 10/2019, 11/2021, 12/01/23  Eye exam: 2019  Dental exam: 2019  AAA screening: 07/2019  Jared had COVID vaccine 10/2020, 01/20/22    Return to clinic in 4 months with labs    Total of 40 minutes were spent on the same day of the visit including preparing to see the patient, obtaining and/or reviewing separately obtained history, performing a medically appropriate examination and/or evaluation, counseling and educating the patient/family/caregiver, ordering medications, tests, or procedures, referring and communication with other health [facility] professionals, documenting clinical information in the electronic or other health record, independently interpreting results and communicating results to the patient/family/caregiver, and [facility] coordination.   I Counseled patient regarding low back pain, depression and weakness. We discussed safety during COVID19 pandemic.     Orders Placed This Encounter    ketoconazole  (NIZORAL ) 2 % topical shampoo         Encounter Medications   Medications    ketoconazole  (NIZORAL ) 2 % topical shampoo     Sig: Apply topically to affected area of damp skin, lather, leave on 5 minutes, and rinse. Repeat daily for 10 days  Indications: seborrheic dermatitis     Dispense:  120 mL     Refill:  3     Future Appointments   Date Time Provider Department Center   01/11/2025  4:00 PM Oneita Dorn LABOR, MD [facility] Exam   01/13/2025 10:15 AM Christel Fonda DASEN, MD [facility]   03/17/2025  9:15  AM Christel Fonda DASEN, MD [facility]     There are no Patient Instructions on file for this visit.

## 2024-10-29 ENCOUNTER — Encounter: Admit: 2024-10-29 | Discharge: 2024-10-29 | Payer: BLUE CROSS/BLUE SHIELD

## 2024-11-14 ENCOUNTER — Encounter: Admit: 2024-11-14 | Discharge: 2024-11-14 | Payer: BLUE CROSS/BLUE SHIELD

## 2024-11-23 ENCOUNTER — Encounter: Admit: 2024-11-23 | Discharge: 2024-11-23 | Payer: BLUE CROSS/BLUE SHIELD

## 2024-11-26 ENCOUNTER — Encounter: Admit: 2024-11-26 | Discharge: 2024-11-26 | Payer: BLUE CROSS/BLUE SHIELD

## 2024-11-28 ENCOUNTER — Encounter: Admit: 2024-11-28 | Discharge: 2024-11-28 | Payer: BLUE CROSS/BLUE SHIELD

## 2024-11-28 DIAGNOSIS — T84418A Breakdown (mechanical) of other internal orthopedic devices, implants and grafts, initial encounter: Principal | ICD-10-CM

## 2024-11-29 ENCOUNTER — Ambulatory Visit: Admit: 2024-11-29 | Discharge: 2024-11-30 | Payer: BLUE CROSS/BLUE SHIELD

## 2024-11-29 ENCOUNTER — Encounter: Admit: 2024-11-29 | Discharge: 2024-11-29 | Payer: BLUE CROSS/BLUE SHIELD

## 2024-11-29 ENCOUNTER — Ambulatory Visit: Admit: 2024-11-29 | Discharge: 2024-11-29 | Payer: BLUE CROSS/BLUE SHIELD

## 2024-12-13 ENCOUNTER — Inpatient Hospital Stay: Admit: 2024-12-13 | Discharge: 2024-12-13 | Payer: BLUE CROSS/BLUE SHIELD

## 2024-12-13 ENCOUNTER — Encounter: Admit: 2024-12-13 | Discharge: 2024-12-13 | Payer: BLUE CROSS/BLUE SHIELD

## 2024-12-13 LAB — TYPE & CROSSMATCH
~~LOC~~ BKR ANTIBODY SCREEN: NEGATIVE
~~LOC~~ BKR UNITS ORDERED: 0

## 2024-12-13 MED ORDER — BISACODYL 10 MG RE SUPP
10 mg | Freq: Every day | RECTAL | 0 refills | Status: DC | PRN
Start: 2024-12-13 — End: 2024-12-14

## 2024-12-13 MED ORDER — ASCORBIC ACID (VITAMIN C) 500 MG PO TAB
500 mg | Freq: Every day | ORAL | 0 refills | Status: DC
Start: 2024-12-13 — End: 2024-12-14
  Administered 2024-12-13 – 2024-12-14 (×2): 500 mg via ORAL

## 2024-12-13 MED ORDER — ACETAMINOPHEN 1,000 MG/100 ML (10 MG/ML) IV SOLN
1000 mg | Freq: Once | INTRAVENOUS | 0 refills | Status: DC | PRN
Start: 2024-12-13 — End: 2024-12-13

## 2024-12-13 MED ORDER — ALBUMIN, HUMAN 5 % 250 ML IV SOLP (AN)(OSM)
INTRAVENOUS | 0 refills | Status: DC
Start: 2024-12-13 — End: 2024-12-13

## 2024-12-13 MED ORDER — FENTANYL CITRATE (PF) 50 MCG/ML IJ SOLN
25 ug | INTRAVENOUS | 0 refills | Status: DC | PRN
Start: 2024-12-13 — End: 2024-12-13

## 2024-12-13 MED ORDER — POLYETHYLENE GLYCOL 3350 17 GRAM PO PWPK
1 | Freq: Two times a day (BID) | ORAL | 0 refills | Status: DC
Start: 2024-12-13 — End: 2024-12-14

## 2024-12-13 MED ORDER — ARTIFICIAL TEARS (PF) SINGLE DOSE DROPS GROUP
OPHTHALMIC | 0 refills | Status: DC
Start: 2024-12-13 — End: 2024-12-13

## 2024-12-13 MED ORDER — FENTANYL CITRATE (PF) 50 MCG/ML IJ SOLN
INTRAVENOUS | 0 refills | Status: DC
Start: 2024-12-13 — End: 2024-12-13

## 2024-12-13 MED ORDER — LIDOCAINE (PF) 20 MG/ML (2 %) IJ SOLN
INTRAVENOUS | 0 refills | Status: DC
Start: 2024-12-13 — End: 2024-12-13

## 2024-12-13 MED ORDER — PANTOPRAZOLE 40 MG PO TBEC
40 mg | Freq: Every day | ORAL | 0 refills | Status: DC
Start: 2024-12-13 — End: 2024-12-14

## 2024-12-13 MED ORDER — HYDROMORPHONE (PF) 2 MG/ML IJ SYRG
INTRAVENOUS | 0 refills | Status: DC
Start: 2024-12-13 — End: 2024-12-13

## 2024-12-13 MED ORDER — DEXAMETHASONE SODIUM PHOSPHATE 4 MG/ML IJ SOLN
INTRAVENOUS | 0 refills | Status: DC
Start: 2024-12-13 — End: 2024-12-13

## 2024-12-13 MED ORDER — DOCUSATE SODIUM 100 MG PO CAP
100 mg | Freq: Two times a day (BID) | ORAL | 0 refills | Status: DC
Start: 2024-12-13 — End: 2024-12-14
  Administered 2024-12-14: 16:00:00 100 mg via ORAL

## 2024-12-13 MED ORDER — PROPOFOL 10 MG/ML IV EMUL 100 ML (INFUSION)(AM)(OR)
INTRAVENOUS | 0 refills | Status: DC
Start: 2024-12-13 — End: 2024-12-13

## 2024-12-13 MED ORDER — HYDROMORPHONE (PF) 1 MG/ML IJ SOLN
.2 mg | INTRAVENOUS | 0 refills | Status: DC | PRN
Start: 2024-12-13 — End: 2024-12-13

## 2024-12-13 MED ORDER — MELOXICAM 15 MG PO TAB
15 mg | Freq: Every morning | ORAL | 0 refills | Status: DC
Start: 2024-12-13 — End: 2024-12-14
  Administered 2024-12-13 – 2024-12-14 (×2): 15 mg via ORAL

## 2024-12-13 MED ORDER — CITALOPRAM 40 MG PO TAB
40 mg | Freq: Every day | ORAL | 0 refills | Status: DC
Start: 2024-12-13 — End: 2024-12-14
  Administered 2024-12-14: 16:00:00 40 mg via ORAL

## 2024-12-13 MED ORDER — FENTANYL CITRATE (PF) 50 MCG/ML IJ SOLN
50 ug | INTRAVENOUS | 0 refills | Status: DC | PRN
Start: 2024-12-13 — End: 2024-12-13

## 2024-12-13 MED ORDER — ONDANSETRON HCL (PF) 4 MG/2 ML IJ SOLN
4 mg | Freq: Once | INTRAVENOUS | 0 refills | Status: DC | PRN
Start: 2024-12-13 — End: 2024-12-13

## 2024-12-13 MED ORDER — PHENYLEPHRINE HCL IN 0.9% NACL 1 MG/10 ML (100 MCG/ML) IV SYRG
INTRAVENOUS | 0 refills | Status: DC
Start: 2024-12-13 — End: 2024-12-13

## 2024-12-13 MED ORDER — ACETAMINOPHEN 500 MG PO TAB
1000 mg | Freq: Once | ORAL | 0 refills | Status: CP
Start: 2024-12-13 — End: ?
  Administered 2024-12-13: 13:00:00 1000 mg via ORAL

## 2024-12-13 MED ORDER — OXYCODONE 5 MG PO TAB
5 mg | ORAL | 0 refills | Status: DC | PRN
Start: 2024-12-13 — End: 2024-12-13

## 2024-12-13 MED ORDER — DIPHENHYDRAMINE HCL 25 MG PO CAP
25 mg | ORAL | 0 refills | Status: DC | PRN
Start: 2024-12-13 — End: 2024-12-14

## 2024-12-13 MED ORDER — CEFAZOLIN 2 GRAM IV SOLR
INTRAVENOUS | 0 refills | Status: DC
Start: 2024-12-13 — End: 2024-12-13

## 2024-12-13 MED ORDER — CEFAZOLIN 2 GRAM IV SOLR
2 g | INTRAVENOUS | 0 refills | Status: CP
Start: 2024-12-13 — End: ?
  Administered 2024-12-13 – 2024-12-14 (×2): 2 g via INTRAVENOUS

## 2024-12-13 MED ORDER — SUGAMMADEX 100 MG/ML IV SOLN
INTRAVENOUS | 0 refills | Status: DC
Start: 2024-12-13 — End: 2024-12-13

## 2024-12-13 MED ORDER — MAGNESIUM HYDROXIDE 400 MG/5 ML PO SUSP
30 mL | Freq: Every day | ORAL | 0 refills | Status: DC
Start: 2024-12-13 — End: 2024-12-14

## 2024-12-13 MED ORDER — ACETAMINOPHEN 500 MG PO TAB
1000 mg | Freq: Once | ORAL | 0 refills | Status: DC | PRN
Start: 2024-12-13 — End: 2024-12-13

## 2024-12-13 MED ORDER — BUPIVACAINE-EPINEPHRINE 0.5 %-1:200,000 IJ SOLN
0 refills | Status: DC
Start: 2024-12-13 — End: 2024-12-13

## 2024-12-13 MED ORDER — ROSUVASTATIN 20 MG PO TAB
20 mg | Freq: Every day | ORAL | 0 refills | Status: DC
Start: 2024-12-13 — End: 2024-12-14
  Administered 2024-12-13 – 2024-12-14 (×2): 20 mg via ORAL

## 2024-12-13 MED ORDER — EZETIMIBE 10 MG PO TAB
10 mg | Freq: Every morning | ORAL | 0 refills | Status: DC
Start: 2024-12-13 — End: 2024-12-14
  Administered 2024-12-13 – 2024-12-14 (×2): 10 mg via ORAL

## 2024-12-13 MED ORDER — CETIRIZINE 10 MG PO TAB
10 mg | Freq: Every morning | ORAL | 0 refills | Status: DC
Start: 2024-12-13 — End: 2024-12-14
  Administered 2024-12-14: 16:00:00 10 mg via ORAL

## 2024-12-13 MED ORDER — FOLIC ACID 1 MG PO TAB
1 mg | Freq: Every day | ORAL | 0 refills | Status: DC
Start: 2024-12-13 — End: 2024-12-14
  Administered 2024-12-13 – 2024-12-14 (×2): 1 mg via ORAL

## 2024-12-13 MED ORDER — VANCOMYCIN 1,000 MG IV SOLR
0 refills | Status: DC
Start: 2024-12-13 — End: 2024-12-13

## 2024-12-13 MED ORDER — ONDANSETRON HCL (PF) 4 MG/2 ML IJ SOLN
4 mg | INTRAVENOUS | 0 refills | Status: DC | PRN
Start: 2024-12-13 — End: 2024-12-14

## 2024-12-13 MED ORDER — ONDANSETRON HCL (PF) 4 MG/2 ML IJ SOLN
INTRAVENOUS | 0 refills | Status: DC
Start: 2024-12-13 — End: 2024-12-13

## 2024-12-13 MED ORDER — REMIFENTANYL 1000MCG IN NS 20ML (OR)
INTRAVENOUS | 0 refills | Status: DC
Start: 2024-12-13 — End: 2024-12-13

## 2024-12-13 MED ORDER — HYDROCODONE-ACETAMINOPHEN 5-325 MG PO TAB
1 | ORAL | 0 refills | Status: DC | PRN
Start: 2024-12-13 — End: 2024-12-14
  Administered 2024-12-13 – 2024-12-14 (×2): 1 via ORAL

## 2024-12-13 MED ORDER — LOSARTAN 25 MG PO TAB
25 mg | Freq: Every day | ORAL | 0 refills | Status: DC
Start: 2024-12-13 — End: 2024-12-14
  Administered 2024-12-14: 16:00:00 25 mg via ORAL

## 2024-12-13 MED ORDER — SUCCINYLCHOLINE CHLORIDE 20 MG/ML IJ SOLN
INTRAVENOUS | 0 refills | Status: DC
Start: 2024-12-13 — End: 2024-12-13

## 2024-12-13 MED ORDER — LACTATED RINGERS IV BOLUS
1000 mL | Freq: Once | INTRAVENOUS | 0 refills | Status: CP
Start: 2024-12-13 — End: ?

## 2024-12-13 MED ORDER — PHENYLEPHRINE IN 0.9% NACL (STD CONC) (PREMADE)(AM)(OR)
INTRAVENOUS | 0 refills | Status: DC
Start: 2024-12-13 — End: 2024-12-13

## 2024-12-13 MED ORDER — DIPHENHYDRAMINE HCL 50 MG/ML IJ SOLN
25 mg | INTRAVENOUS | 0 refills | Status: DC | PRN
Start: 2024-12-13 — End: 2024-12-14

## 2024-12-13 MED ORDER — ZOLPIDEM 5 MG PO TAB
5 mg | Freq: Every evening | ORAL | 0 refills | Status: DC | PRN
Start: 2024-12-13 — End: 2024-12-14

## 2024-12-13 MED ORDER — ROCURONIUM 10 MG/ML IV SOLN
INTRAVENOUS | 0 refills | Status: DC
Start: 2024-12-13 — End: 2024-12-13

## 2024-12-13 MED ORDER — PROPOFOL INJ 10 MG/ML IV VIAL
INTRAVENOUS | 0 refills | Status: DC
Start: 2024-12-13 — End: 2024-12-13

## 2024-12-13 MED ADMIN — LACTATED RINGERS IV SOLP [4318]: 1000 mL | INTRAVENOUS | @ 13:00:00 | Stop: 2024-12-13 | NDC 00338011704

## 2024-12-13 MED ADMIN — WATER FOR INJECTION, STERILE IJ SOLN [79513]: 20 mL | INTRAVENOUS | @ 23:00:00 | Stop: 2024-12-13 | NDC 00409488723

## 2024-12-13 NOTE — H&P [4]
 [facility] CLINIC NOTE     Subjective  REASON FOR VISIT   Pain of the Middle Back and Follow Up (CT review)        SUBJECTIVE   Jared Hebert is a 73 y.o. male who returns today to review CT T and L-spine findings.  He reports it is getting harder to sleep at night due to the pain in his midback.           REVIEW OF SYSTEMS   Review of Systems     ALLERGIES   [Allergies]    [Allergies]       Allergen Reactions    Seasonal Allergies SNEEZING     Past Medical History:    Abnormal cardiac function test    Anemia, unspecified    Arthritis    Cataract    Coronary artery disease    Coronary atherosclerosis    Degenerative disc disease, lumbar    Degenerative disc disease, thoracic    Depression    Displaced fracture of acromial process, right shoulder, initial encounter for closed fracture    Generalized headaches    GERD (gastroesophageal reflux disease)    Hearing loss    Hyperlipidemia    Hypertension    Hypertriglyceridemia    Joint pain    Joint stiffness    Nontraumatic complete tear of rotator cuff    Obesity    Sleep apnea    Spinal stenosis    Ulcer     Surgical History:   Procedure Laterality Date    VASECTOMY  1984    HX CATARACT REMOVAL Bilateral 02/2017    OS Toric    PARS PLANA MECHANICAL VITRECTOMY WITH  [place]olog Right 09/02/2018    Performed by Lindajo Ronal DASEN, MD at [facility] OR    PARS PLANA MECHANICAL VITRECTOMY Left 11/04/2018    Performed by Lindajo Ronal DASEN, MD at [facility] OR    ANGIOGRAPHY CORONARY ARTERY WITH LEFT HEART CATHETERIZATION N/A 01/13/2022    Performed by Lannis Maude SAILOR, MD at [facility] CATH LAB    POSSIBLE PERCUTANEOUS CORONARY STENT PLACEMENT WITH ANGIOPLASTY N/A 01/13/2022    Performed by Lannis Maude SAILOR, MD at [facility] CATH LAB    Anterior /Posterior Lumbar two -lumbar Five decompression and fusion with infuse and allograft. Left 11/24/2022    Performed by Christel Fonda DASEN, MD at [facility] OR    THORACIC FUSION  08/2023    ABDOMEN SURGERY  11/2022    Lumbar Fusion    CARDIAC CATHERIZATION      CARDIOVASCULAR STRESS TEST      ECHOCARDIOGRAM PROCEDURE      ELECTROCARDIOGRAM      HX ADENOIDECTOMY      HX ARTHROSCOPIC SURGERY      Knee Scope    HX JOINT REPLACEMENT  11/2020    Right Shoulder    HX TONSILLECTOMY      KNEE SURGERY  Left Knee Scope 4 yrs ago    LAMINECTOMY  11/2022    NASAL SURGERY      TWICE    SHOULDER ARTHROPLASTY Right 04/2021    Right Shoulder - Injury    SURGERY  11/2022.  08/2023.    Right Shoulder    UMBILICAL ARTERIAL CATH - BEDSIDE  Less than a year    No Stents Placed     Social History     Socioeconomic History    Marital status: Married     Spouse name: Rock   Tobacco Use  Smoking status: Former     Current packs/day: 0.00     Average packs/day: 1 pack/day for 39.4 years (39.4 ttl pk-yrs)     Types: Cigarettes, Pipe, Cigars     Start date: 08/04/1967     Quit date: 12/22/2006     Years since quitting: 17.9     Passive exposure: Past    Smokeless tobacco: Former     Types: Chew     Quit date: 12/22/2006    Tobacco comments:     15 + yrs since last smoked   Vaping Use    Vaping status: Never Used   Substance and Sexual Activity    Alcohol use: Not Currently     Alcohol/week: 2.0 - 6.0 standard drinks of alcohol     Types: 2 - 6 Standard drinks or equivalent per week    Drug use: Never    Sexual activity: Not Currently     Partners: Female     Birth control/protection: Surgical          MEDICATIONS   [Medications Ordered Prior to [employer] Ordered Prior to [employer]         Current Outpatient Medications on File Prior to Visit   Medication Sig Dispense Refill    ascorbic acid  (VITAMIN C ) 500 mg tablet Take one tablet by mouth daily.        cetirizine  (ZYRTEC ) 10 mg tablet Take one tablet by mouth every morning.        citalopram  (CELEXA ) 40 mg tablet Take one tablet by mouth daily. 90 tablet 3    cyclobenzaprine  (FLEXERIL ) 5 mg tablet Take one tablet to two tablets by mouth daily as needed.        ezetimibe  (ZETIA ) 10 mg tablet TAKE 1 TABLET BY MOUTH EVERY DAY IN THE MORNING 90 tablet 3    fluticasone propionate (FLONASE) 50 mcg/actuation nasal spray, suspension Apply one spray to each nostril as directed at bedtime daily. Shake bottle gently before using.        folic acid  (FOLVITE ) 1 mg tablet TAKE 1 TABLET BY MOUTH EVERY DAY 90 tablet 2    gabapentin  (NEURONTIN ) 300 mg capsule Take one capsule by mouth every 8 hours. 270 capsule 3    GLUCOSAMINE SULFATE PO Take 1,500 mg by mouth every morning.        ketoconazole  (NIZORAL ) 2 % topical cream APPLY TO AFFECTED AREA TWICE DAILY FOR 14 DAYS 30 g 1    losartan  (COZAAR ) 25 mg tablet TAKE 1 TABLET BY MOUTH EVERY DAY 90 tablet 3    meloxicam  (MOBIC ) 15 mg tablet Take one tablet by mouth every morning. 90 tablet 3    omeprazole  DR (PRILOSEC) 40 mg capsule TAKE ONE CAPSULE BY MOUTH DAILY BEFORE BREAKFAST. 90 capsule 1    rosuvastatin  (CRESTOR ) 20 mg tablet TAKE 1 TABLET BY MOUTH EVERY DAY 90 tablet 3    zolpidem  (AMBIEN ) 5 mg tablet TAKE 1 TABLET BY MOUTH AT BEDTIME AS NEEDED FOR SLEEP 30 tablet 1      No current facility-administered medications on file prior to visit.        PHYSICAL EXAM          Vitals:     10/21/24 0837   BP: 131/74   Pulse: 67   SpO2: 99%   PainSc: Three   Weight: 86.2 kg (190 lb)   Height: 172.7 cm (5' 8)      Oswestry Total Score:: (Patient-Rptd) 22  Pain Score: Three  Body mass index is 28.89 kg/m?SABRA     Constitutional: Alert, NAD  Head: Atraumatic  Eyes: EOMI  Respiratory: Unlabored breathing  Cardiovascular: Regular rate  Skin: No rashes or open wounds appreciated on back  Musculoskeletal: Strength stable  Neurologic: Sensation stable     RADIOLOGY      CT scan demonstrates the known set cap that is off.  I don't think that there is any arthrodesis above L3.  There is certainly some osteophyte formation over the upper levels.  Fractured level being at T11.  This looks fully consolidated and healed at this point.     Assessment & Plan  ASSESSMENT / PLAN   Jared Hebert is a 73 y.o. male s/p anterior posterior lumbar interbody fusion L2-5 and posterior lumbar decompression L3-4 on 11/24/2022 with subsequent T11 fracture with revision operation extending his instrumentation up to T9.  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          I think right now he has three options.  First option is to do nothing, which I don't think is a great option for him.  Two, we could remove the instrumentation construct that was added on as a result of his fracture.  I tend to think that would be my recommendations.  Last option is we we go in and remove everything and re-instrument everything.  I discussed with him that I don't know that with any of the above options he is going to be any better.  I would tend to think that would not be the case, but there is a risk he comes out of another surgery no better than he is now.  I think he would like to proceed with the second option.  I think that is reasonable.  Surgery will be segmental spinal instrumentation removal from T9-L2.  We have had a conversation about the possibilities that we are unable to remove some of the instrumentation and he is okay with us  leaving instrumentation behind if we think we are going to create more of a problem by trying to remove everything.  This is going to be a big enough operation that we would plan on doing extended recovery, making sure he is doing okay before he would leave the hospital.  We can get something scheduled at a mutually convenient time.    Shepard Louder, APRN, FNP-C   Nurse Practitioner-Spine Surgery  [employer]

## 2024-12-13 NOTE — Procedure - Immed Post [600086]
 Neurology intra-Operating Monitoring report (IOM)    Date of procedure: 12/13/2024    Diagnosis: Lumbar pseudoarthrosis, degenerative disc disease of lumbar spine  Procedure: Removal of thoracic 9-lumbar 2 instrumentation  Surgeon: Christel  Baseline acquisition time: 0833  Monitoring start time: 0835  Incision time: 0908  Closing started at: 1023  Monitoring stop time: 1024    TcMEP: Trancranial Motor Evoked Potential:  Post induction baseline transcranial motor evoked potentials from bilateral abductor digiti minimi, adductor longus, vastus lateralis, tibialis anterior, gastrocnemius and adductor hallucis muscles were interpretable and reproducible. The compound muscle action potential data showed no evidence to suggest change in the lateral cortico-spinal pathways as a consequence of this surgery.    SSEP: Somatosensory Evoked Potentials:  Post induction baseline somatosensory evoked potentials from bilateral ulnar and posterior tibial nerves were obtained and were interpretable, reproducible and monitorable. The data from neurophysiological monitoring of somatosensory evoked potentials showed no evidence to suggest change in the posterior column-medial lemniscus cortical pathways as a consequence of this surgery.    EMG: Electromyography:  EMG was continuously monitored from bilateral abductor digiti minimi, adductor longus, vastus lateralis, tibialis anterior, gastrocnemius and adductor hallucis muscles were obtained and monitored throughout the procedure. There was no sustained baseline EMG activity.     Repetitive Nerve Stimulation:  TO4 (train of 4) was frequently run throughout the procedure to monitor for neuromuscular blockade. A nerve was repetitively stimulation and the compound motor action potentials (CMAPs) recorded from a distal motor group. TO4 was assessed during critical periods of surgery and demonstrated that neuromuscular blockade was not present while monitoring EMGs and TcMEP.        I remotely monitored and supervised this case in real-time, online via a secure HIPAA-compliant network.    Danial Sima, MD  Clinical Assistant Professor  Department of Neurology

## 2024-12-13 NOTE — Case Mgmt DC Plan [600024]
 Case Management Admission Assessment    NAME:Jared Hebert                          MRN: [id number]             DOB:1951-10-02          AGE: 73 y.o.  ADMISSION DATE: 12/13/2024             DAYS ADMITTED: LOS: 0 days      Today?s Date: 12/13/2024    Source of Information: patient, EMR       Plan  Plan: Case Management Assessment, Assist PRN with SW/NCM Services    This CM met with pt for assessment on this date.  Provided contact information and explanation of SW/NCM roles.  Reviewed [facility] and Preparing for Discharge hand-outs.  Provided opportunity for questions and discussion. Pt/family encouraged to contact Case Management team with questions and concerns during hospitalization and until patient is able to transition back to the patient's primary care physician.    *NCM visited with patient and wife at bedside.  *Role of NCM explained and business card provided.  *Pt states he's independent with self-care & ADL's PTA & has no concerns for discharge.  *DC Transport:  Pts son   *DME:  None  *HH / HI:  None  *SNF / IPR / LTACH:  None  *No NCM needs identified at this time. NCM encouraged pt to reach out if any needs should change. Pt verbalized understanding.  *NCM will continue to follow and assess for additional needs through discharge.      Patient Address/Phone  [address] [place] [address]  [phone] (home)     Emergency Contact  Extended Emergency Contact Information  Primary Emergency Contact: Stankiewicz,Linda  Address: [phone] [place] [address] [place]   Home Phone: [phone]  Relation: Spouse  Interpreter needed? No  Secondary Emergency Contact: Braman,Charlie  Address: [address]           [place], [place]   Home Phone: [phone]  Relation: Brother  Interpreter needed? No    Forensic Scientist: Yes, patient has a healthcare directive  Would patient like to fill out a (a new) Healthcare Directive?: No, patient declined  Psych Advance Directive (Psych unit only): No, patient does not have a Social Research Officer, Government  Does the Patient Need Case Management to Arrange Discharge Transport? (ex: facility, ambulance, wheelchair/stretcher, Medicaid, cab, other): No  Will the Patient Use Family Transport?: Yes  Transportation Name, Phone and Availability #1:  Joya: [phone])    Expected Discharge Date  12/15/2024     Living Situation Prior to Admission  Living Arrangements  Type of Residence: Home, independent  Living Arrangements: Spouse/significant other  Financial Risk Analyst / Tub: Psychologist, Counselling  How many levels in the residence?: 3  Can patient live on one level if needed?: Yes  Does residence have entry and/or inside stairs?: Yes (2STE)  Assistance needed prior to admit or anticipated on discharge: No  Who provides assistance or could if needed?:  (spouse)  Are they in good health?: Yes  Can support system provide 24/7 care if needed?: Yes  Level of Function   Prior level of function: Independent  Cognitive Abilities   Cognitive Abilities: Alert and Oriented, Engages in problem solving and planning, Recognizes impact of  health condition on lifestyle, Participates in decision making    Financial Resources  Coverage  Primary Insurance: Medicare  Secondary Insurance: Nurse, learning disability  Additional Coverage: None  Medication Coverage    Medication Coverage: Commercial insurance  Have you experienced a noticeable increase in your copay costs recently?: No  Are current medications affordable?: Yes  Do You Use a Co-Pay Card or a Medication Assistance Program to Help Manage Medication Costs?: No  Do You Manage Your Own Medications?: Yes  Source of Income   Source Of Income: SSI, Other retirement income  Financial Assistance Needed?  None identified at this time     Psychosocial Needs  Mental Health  Mental Health History: No  Substance Use History  Substance Use History Screen: No  Social Determinants of Health (SDOH):                               Other  None identified at this time     Current/Previous Services  PCP  [facility], [place], [phone], [phone]  Pharmacy    [facility] [phone], [place] - [address] AT [facility] [address]  [address]  [place] [address]  Phone: [phone] Fax: [phone]    [facility] [address], [place] - [address]  [address]  [place] [address]  Phone: [phone] Fax: [phone]    Durable Medical Equipment   Durable Medical Equipment at home: None  Home Health  Receiving home health: No  Hemodialysis or Peritoneal Dialysis  Undergoing hemodialysis or peritoneal dialysis: No  Tube/Enteral Feeds  Receive tube/enteral feeds: No  Infusion  Receive infusions: No  Private Duty  Private duty help used: No  Home and Community Based Services  Home and community based services: No  Ryan White  Ryan White: N/A  Hospice  Hospice: No  Outpatient Therapy  PT: No  OT: No  SLP: No  Skilled Nursing Facility/Nursing Home  SNF: No  NH: No  Inpatient Rehab  IPR: No  Long-Term Acute Care Hospital  LTACH: No  Acute Hospital Stay  Acute Hospital Stay: In the past  Was patient's stay within the last 30 days?: No      Lorrene Perfect RN, BSN, NCM  Integrated Nurse Case Manager  Phone: [phone]  Pager: [phone]

## 2024-12-13 NOTE — Progress Notes [1]
 [facility]43 END OF SHIFT/ JHFRAT NOTE    Admission Date: 12/13/2024  Length of Stay: LOS: 0 days    Acute events, interventions, provider communication: OR today for HW removal.     Patient Interventions and Education  Fall Risk/JHFRAT Interventions and Education: (Charting when applicable)   Total Fall Risk Score: Total Fall Risk Score: 12.   Elimination Interventions : Place male/male urinal within reach   Medications : Educate patient on medication side effects and Bed/chair alarm (i.e., change in mental status)   Patient Care Equipment: Needs assistance with patient care equipment when ambulating, Ensure environment is free of clutter and walkways are clear from tripping hazards, and Assess need for patient equipment and remove if not in use  Mobility: Assist x1, Gait belt in use when ambulating, Utilize walker, cane, or additional walking aid for ambulation, Elimination equipment at bedside (urinal or commode) , and Ensure the use of corrective lens and/or hearing aides are in place prior to ambulation   Cognition: Bed/Chair Alarm   Risk for Moderate/Major Injury: Age: >65 yrs and Surgery/procedure requiring anesthesia within past 24 hours    Restraints:  No     Restraint Goal: Patient will be free from injury while physically restrained.  See Docflowsheet for restraint documentation, interventions, education, etc.    Hygiene:                      Intake and Output:           Last Bowel Movement Date:  (PTA),           Date 12/12/24 0701 - 12/13/24 0700(Not Admitted) 12/13/24 0701 - 12/14/24 0700   Shift 0701-1900 1901-0700 24 Hour Total 0701-1900 1901-0700 24 Hour Total   INTAKE   P.O.    400  400   I.V.    700  700   Shift Total(mL/kg)    1100(12.7)  1100(12.7)   OUTPUT   Urine    287  287     Urine    287  287   Other           Stool Occurrence    0 x  0 x     Urine Occurrence     1 x  1 x   Shift Total(mL/kg)    287(3.3)  287(3.3)   NET    813  813   Weight (kg)  86.6 86.6 86.6 86.6 86.6

## 2024-12-14 ENCOUNTER — Inpatient Hospital Stay: Admit: 2024-10-21 | Discharge: 2024-10-21 | Payer: BLUE CROSS/BLUE SHIELD

## 2024-12-14 ENCOUNTER — Encounter: Admit: 2024-12-14 | Discharge: 2024-12-14 | Payer: BLUE CROSS/BLUE SHIELD

## 2024-12-14 VITALS — BP 113/64 | HR 82 | Temp 98.20000°F | Ht 67.992 in | Wt 190.9 lb

## 2024-12-14 DIAGNOSIS — I2510 Atherosclerotic heart disease of native coronary artery without angina pectoris: Secondary | ICD-10-CM

## 2024-12-14 DIAGNOSIS — T8489XA Other specified complication of internal orthopedic prosthetic devices, implants and grafts, initial encounter: Secondary | ICD-10-CM

## 2024-12-14 DIAGNOSIS — Z9841 Cataract extraction status, right eye: Secondary | ICD-10-CM

## 2024-12-14 DIAGNOSIS — M960 Pseudarthrosis after fusion or arthrodesis: Secondary | ICD-10-CM

## 2024-12-14 DIAGNOSIS — Z87891 Personal history of nicotine dependence: Secondary | ICD-10-CM

## 2024-12-14 DIAGNOSIS — M51369 Other intervertebral disc degeneration, lumbar region without mention of lumbar back pain or lower extremity pain: Secondary | ICD-10-CM

## 2024-12-14 DIAGNOSIS — Z96611 Presence of right artificial shoulder joint: Secondary | ICD-10-CM

## 2024-12-14 DIAGNOSIS — E782 Mixed hyperlipidemia: Secondary | ICD-10-CM

## 2024-12-14 DIAGNOSIS — I10 Essential (primary) hypertension: Secondary | ICD-10-CM

## 2024-12-14 DIAGNOSIS — Z9842 Cataract extraction status, left eye: Secondary | ICD-10-CM

## 2024-12-14 MED ORDER — DOCUSATE SODIUM 100 MG PO CAP
100 mg | Freq: Two times a day (BID) | ORAL | 0 refills | 30.00000 days | Status: AC
Start: 2024-12-14 — End: ?

## 2024-12-14 MED ORDER — HYDROCODONE-ACETAMINOPHEN 5-325 MG PO TAB
1 | ORAL_TABLET | ORAL | 0 refills | 30.00000 days | Status: AC | PRN
Start: 2024-12-14 — End: ?

## 2024-12-14 MED ADMIN — WATER FOR INJECTION, STERILE IJ SOLN [79513]: 20 mL | INTRAVENOUS | @ 06:00:00 | Stop: 2024-12-14 | NDC 00409488723

## 2024-12-14 NOTE — Progress Notes [1]
 Went over discharge information/instructions, medication instructions/information, and where to pick up medications with pt. PIV removed. Pt belongings packed and sent safely with pt. Pt walked off unit with family/ride @1115  on 12/14/24.

## 2024-12-14 NOTE — Progress Notes [1]
 RT Adult Assessment Note    NAME:Jared Hebert             MRN: [id number]             DOB:Sep 19, 1951          AGE: 73 y.o.  ADMISSION DATE: 12/13/2024             DAYS ADMITTED: LOS: 1 day    Additional Comments:  Impressions of the patient: pt in bed resting comfortably, unlabored and alert   Intervention(s)/outcome(s): evaluation for RT needs complete  Patient education that was completed: none   Recommendations to the care team: none     Vital Signs:  Pulse: 82  RR: 16 PER MINUTE  SpO2: 97 %  O2 Device: None (Room air)  Liter Flow:    O2%:      Breath Sounds:   All Breath Sounds: Clear (Implies normal);Decreased  Respiratory Effort:   Respiratory WDL: Within Defined Limits  Respiratory Effort/Pattern: Unlabored  Comments: pt states he had weight loss and no longer uses or need CPAP

## 2024-12-14 NOTE — Progress Notes [1]
 OCCUPATIONAL THERAPY  NOTE   Name: Jared Hebert   MRN: [id number]     DOB: July 13, 1951      Age: 73 y.o.  Admission Date: 12/13/2024     LOS: 1 day     Date of Service: 12/14/2024      Based on discussion with PT, patient's current level of function suggests that patient will progress with one discipline. OT will sign off at this time. Please re-consult if a change in functional status occurs.      Therapist: Jerona Nyhan, OTR/L [phone]  Date: 12/14/2024

## 2024-12-14 NOTE — Case Mgmt DC Plan [600024]
 Case Management Progress Note    NAME:Jared Hebert                          MRN: [id number]              DOB:06-21-1951          AGE: 73 y.o.  ADMISSION DATE: 12/13/2024             DAYS ADMITTED: LOS: 1 day      Today's Date: 12/14/2024    PLAN: discharge home no needs    Expected Discharge Date: 12/14/2024   Is Patient Medically Stable: Yes   Are there Barriers to Discharge? no    INTERVENTION/DISPOSITION:  Discharge Planning              Discharge Planning: No Needs Identified    NCM reviewed EMR/spoke with primary team regarding inpatient and discharge plan  Patient is s/p planned surgical procedure  No indicated PT/OT/DME needs. Patient is ambulatory and independent at baseline.   No home health indications. Patient able to manage post op incision independently.  Transportation: [place]  There are no additional NCM needs     Transportation              Does the Patient Need Case Management to Arrange Discharge Transport? (ex: facility, ambulance, wheelchair/stretcher, Medicaid, cab, other): No  Will the Patient Use Family Transport?: Yes  Transportation Name, Phone and Availability #1:  Joya: [phone])  Support              Support: Pt/Family Updates re:POC or DC Plan, Huddle/team update, Patient Education  Info or Referral              Information or Referral to [facility]: No Needs Identified  Positive SDOH Domains and Potential Barriers                       Medication Needs              Medication Needs: No Needs Identified                                                                                                                                          Financial              Financial: No Needs Identified  Legal              Legal: No Needs Identified  Other              Other/None: No needs identified  Discharge Disposition  Selected Continued Care - Admitted Since 12/13/2024    No services have been selected for the patient.           Lorrene Perfect, RN    Lorrene Perfect RN, BSN, [place]  Integrated Nurse Case Manager  Phone: [phone]  Pager: [phone]

## 2024-12-14 NOTE — Progress Notes [1]
 PHYSICAL THERAPY  ASSESSMENT      Name: Jared Hebert   MRN: [id number]     DOB: [birth year]      Age: 73 y.o.  Admission Date: 12/13/2024     LOS: 1 day     Date of Service: 12/14/2024      Mobility  Patient Turn/Position: Self  Mobility Level [facility] Highest Level of Mobility (JH-HLM): Walk 250 feet or more  Distance Walked (feet): 250 ft  Level of Assistance: Independent  Assistive Device: None  Activity Limited By: No limitations    Subjective  Reason for Admission and Past Medical Hx: 73 y.o. 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  Mental / Cognitive: Alert;Oriented;Cooperative;Follows commands  Pain: No complaint of pain  Persons Present: Nursing Staff    Home Living Situation  Lives With: Spouse/significant other  Type of Home: House  Entry Stairs: 1  In-Home Stairs: 5  Bathroom Setup: Walk in shower  Patient Owned Equipment: Crutches: Axillary;Walker with wheels    Prior Level of Function  Level Of Independence: Independent with ADL and community mobility without device  History of Falls in Past 3 Months: No    Precautions  Precautions: Back Safety    ROM  R UE ROM: WFL  L UE ROM: WFL  R LE ROM: WFL  L LE ROM: WFL    Strength  Overall Strength: WFL    Bed Mobility/Transfer  Bed Mobility: Supine to Sit: Independent  Transfer Type: Sit to/from Stand  Transfer: Assistance Level: To/From;Bed;Bed Side Chair;Independent  Transfer: Assistive Device: None  End Of Activity Status: Up in Chair;Nursing Notified    Balance  Sitting Balance: Static Sitting Balance;Dynamic Sitting Balance;No UE Support;Independent  Standing Balance: Static Standing Balance;Dynamic Standing Balance;No UE support;Independent    Gait  Gait Distance: 250 feet  Gait: Assistance Level: Independent  Gait: Assistive Device: None  Gait: Descriptors: Pace: Normal;No balance loss  Stairs: Number Climbed: 5  Stairs: Descriptors: Ascend;Descend;Reciprocal  Stairs: Assistance Level: Standby Assist  Stairs: Assistive Device: None    Education  Persons Educated: Patient  Patient Barriers To Learning: None Noted  Teaching Methods: Verbal Instruction  Patient Response: Verbalized Understanding  Topics: Plan/Goals of PT Interventions;Mobility Progression;Precautions;Safety Awareness;Recommend Continued Therapy    Assessment/Progress  Impaired Mobility Due To: Post Surgical Changes  Assessment/Progress: Expect Good Progress  Comments: Patient tolerates session well. Limited secondary to post-op changes and precautions. Recommend continued therapy to maximize independence and safety with functional mobility    AM-PAC 6 Clicks Basic Mobility Inpatient  Turning from your back to your side while in a flat bed without using bed rails: None  Moving from lying on your back to sitting on the side of a flat bed without using bedrails : None  Moving to and from a bed to a chair (including a wheelchair): None  Standing up from a chair using your arms (e.g. wheelchair, or bedside chair): None  To walk in hospital room: None  Climbing 3-5 steps with a railing: None  Basic Mobility Inpatient Raw Score: 24  Standardized (T-scale) Score: 57.68  Mobility Goal [facility]: JH-HLM 8 Walk >= 250 ft    Goals  Goal Formulation: With Patient  Time For Goal Achievement: 1 day  Patient Will Go Up / Down Stairs: 3-5 Stairs, Independently    Plan  Treatment Interventions: Mobility training  Plan Frequency: 5-7 Days per Week  PT Plan for Next Visit: progress independence with stairs    PT Discharge Recommendations  Recommendation: Home/prior  living situation  Patient Currently Requires Equipment: None      Therapist  Dena Holm, PT, DPT  Date  12/14/2024

## 2024-12-14 NOTE — Care Plan [600008]
 Problem: Glucose Management  Goal: Absence of hyperglycemia  Outcome: Goal Achieved  Goal: Absence of Hypoglycemia  Outcome: Goal Achieved  Goal: Glucose level within specified parameters  Outcome: Goal Achieved     Problem: High Fall Risk  Goal: High Fall Risk  Outcome: Goal Achieved

## 2024-12-14 NOTE — Anesthesia Post Op Day 1 [8510010]
 Anesthesia Follow-Up Evaluation: Post-Procedure Day One    Name: Jared Hebert     MRN: [id number]     DOB: 01-07-1951     Age: 73 y.o.     Sex: male   __________________________________________________________________________     Procedure Date: 12/13/2024   Procedure: Procedure(s) with comments:  REMOVAL THORACIC 9 TO LUMBAR 2 INSTRUMENTATION - Prone on Jackson table. Globus robot. Globus implant. Neuromonitoring.  PROCEDURE, SPINE, STEREOTACTIC, COMPUTER-ASSISTED    Physical Assessment  Height: 172.7 cm (5' 7.99)  Weight: 86.6 kg (190 lb 14.7 oz)    Vital Signs (Last Filed in 24 hours)  BP: 125/67 (12/24 0837)  Temp: 36.6 ?C (97.9 ?F) (12/24 9162)  Pulse: 72 (12/24 0837)  Respirations: 16 PER MINUTE (12/24 0837)  SpO2: 98 % (12/24 0837)  O2 Device: None (Room air) (12/24 0837)  O2 Liter Flow: 6 Lpm (12/23 1125)    Patient History   Allergies  Allergies[1]     Medications  Scheduled Meds:ascorbic acid  (vitamin C ) (C-500) tablet 500 mg, 500 mg, Oral, QDAY  cetirizine  (ZyrTEC ) tablet 10 mg, 10 mg, Oral, QAM8  citalopram  (CeleXA ) tablet 40 mg, 40 mg, Oral, QDAY  docusate (COLACE) capsule 100 mg, 100 mg, Oral, BID  ezetimibe  (ZETIA ) tablet 10 mg, 10 mg, Oral, QAM8  folic acid  (FOLVITE ) tablet 1 mg, 1 mg, Oral, QDAY  losartan  (COZAAR ) tablet 25 mg, 25 mg, Oral, QDAY  meloxicam  (MOBIC ) tablet 15 mg, 15 mg, Oral, QAM8  milk of magnesium  oral suspension 30 mL, 30 mL, Oral, QDAY  pantoprazole  DR (PROTONIX ) tablet 40 mg, 40 mg, Oral, QDAY(21)  polyethylene glycol 3350  (MIRALAX ) packet 17 g, 1 packet, Oral, BID  rosuvastatin  (CRESTOR ) tablet 20 mg, 20 mg, Oral, QDAY    Continuous Infusions:  PRN and Respiratory Meds:bisacodyL  QDAY PRN, diphenhydrAMINE  HCL Q6H PRN **OR** diphenhydrAMINE  HCL Q6H PRN, HYDROcodone /acetaminophen  Q4H PRN, ondansetron  Q6H PRN, zolpidem  QHS PRN      Diagnostic Tests  Hematology:   Lab Results   Component Value Date    HGB 13.4 11/29/2024    HGB 13.6 07/25/2023    HCT 39.1 11/29/2024    HCT 40.2 07/25/2023    PLTCT 208 11/29/2024    PLTCT 225 07/25/2023    WBC 4.50 11/29/2024    WBC 4.0 07/25/2023    NEUT 53.6 11/29/2024    NEUT 52 07/25/2023    ANC 2.40 11/29/2024    ANC 2.08 07/25/2023    ALC 1.20 11/29/2024    ALC 1.29 07/25/2023    MONA 12.1 11/29/2024    MONA 11 07/25/2023    AMC 0.50 11/29/2024    AMC 0.44 07/25/2023    EOSA 6.9 11/29/2024    EOSA 4 07/25/2023    ABC 0.00 11/29/2024    ABC 0.02 07/25/2023    MCV 90.3 11/29/2024    MCV 93.3 07/25/2023    MCH 31.0 11/29/2024    MCH 31.6 07/25/2023    MCHC 34.4 11/29/2024    MCHC 33.9 07/25/2023    MPV 6.3 11/29/2024    MPV 6.3 07/25/2023    RDW 12.9 11/29/2024    RDW 13.2 07/25/2023         General Chemistry:   Lab Results   Component Value Date    NA 140 11/29/2024    NA 139 07/25/2023    K 4.6 11/29/2024    K 4.4 07/25/2023    CL 105 11/29/2024    CL 106 07/25/2023    CO2 27 11/29/2024  CO2 26 07/25/2023    GAP 8 11/29/2024    GAP 7 07/25/2023    BUN 38 11/29/2024    BUN 28 07/25/2023    CR 0.72 11/29/2024    CR 0.69 07/25/2023    GLU 104 11/29/2024    GLU 95 07/25/2023    CA 9.4 11/29/2024    CA 8.7 07/25/2023    ALBUMIN  4.4 11/29/2024    ALBUMIN  4.5 07/25/2023    TOTBILI 0.5 11/29/2024    TOTBILI 0.4 07/25/2023      Coagulation:   Lab Results   Component Value Date    PT 11.4 11/29/2024    PT 11.2 11/05/2022    PTT 27.8 11/29/2024    PTT 30.8 11/05/2022    INR 1.0 11/29/2024    INR 1.0 11/05/2022         Follow-Up Assessment  Patient location during evaluation: floor      Anesthetic Complications:   Anesthetic complications: The patient did not experience any anesthestic complications.      Pain:  Score: 0    Management:adequate     Level of Consciousness: awake   Hydration:acceptable     Airway Patency: patent   Respiratory Status: acceptable     Cardiovascular Status:acceptable   Regional/Neuroaxial:              Comments: Patient reports no anesthetic complications. All of the patients concerns and questions in regards to their anesthetic were answered to their satisfaction.                  [1]  Allergies  Allergen Reactions   ? Seasonal Allergies SNEEZING

## 2024-12-14 NOTE — Progress Notes [1]
 [facility] END OF SHIFT/ JHFRAT NOTE    Admission Date: 12/13/2024  Length of Stay: LOS: 1 day    Acute events, interventions, provider communication: n/a    Patient Interventions and Education  Fall Risk/JHFRAT Interventions and Education: (Charting when applicable)   Total Fall Risk Score: Total Fall Risk Score: 14.   Elimination Interventions : N/A  Medications : Educate patient on medication side effects and Bed/chair alarm (i.e., change in mental status)   Patient Care Equipment: Needs assistance with patient care equipment when ambulating and Ensure environment is free of clutter and walkways are clear from tripping hazards  Mobility: Assist x1  Cognition: N/A  Risk for Moderate/Major Injury: Age: >65 yrs and Surgery/procedure requiring anesthesia within past 24 hours    Restraints:  No     Restraint Goal: Patient will be free from injury while physically restrained.  See Docflowsheet for restraint documentation, interventions, education, etc.    Hygiene:                 Urinary Catheter / Perineal Care: Self    Intake and Output:           Last Bowel Movement Date:  (pta),           Date 12/13/24 0701 - 12/14/24 0700 12/14/24 0701 - 12/15/24 0700   Shift 0701-1900 1901-0700 24 Hour Total 0701-1900 1901-0700 24 Hour Total   INTAKE   P.O. 400 240 640      I.V.(mL/kg/hr) 700(0.7)  700      Shift Total(mL/kg) 1100(12.7) 240(2.8) 1340(15.5)      OUTPUT   Urine(mL/kg/hr) 287(0.3)  287        Urine 287  287      Other           Stool Occurrence 0 x  0 x        Urine Occurrence  1 x 2 x 3 x      Shift Total(mL/kg) 287(3.3)  287(3.3)      NET [phone]      Weight (kg) 86.6 86.6 86.6 86.6 86.6 86.6

## 2024-12-14 NOTE — Discharge Summary [5]
 Physician Discharge Summary      Name: Jared Hebert  MRN: [id number]  Date Of Birth: 1951-07-23  Age:  73 years   Admit date:  12/13/2024                     Discharge date:  12/14/2024 11:15 AM    Account Number:  [id number]    Discharge Attending:   Dr. 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  Active Problems:    Failure of spinal implant      Significant Past Medical History       Abnormal cardiac function test  Anemia, unspecified      Comment:  ((Pt Qnr Sub: yes))   Arthritis      Comment:  back & shoulders  Cataract  Coronary artery disease  Coronary atherosclerosis  Degenerative disc disease, lumbar  Degenerative disc disease, thoracic      Comment:  Fall injuries  Depression  Displaced fracture of acromial process, right shoulder, initial   encounter for closed fracture  Generalized headaches  GERD (gastroesophageal reflux disease)  Hearing loss  Hyperlipidemia  Hypertension  Hypertriglyceridemia  Joint pain      Comment:  Left Shoulder and upper Back.  Joint stiffness  Nontraumatic complete tear of rotator cuff  Obesity  Sleep apnea  Spinal stenosis  Ulcer      Comment:  gastric    Allergies  Seasonal allergies    Admission Radiology/Lab:   No results found for this visit on 12/13/24 (from the past 24 hours).    Admission Physical Exam:     Constitutional: Alert, NAD  Head: Atraumatic  Eyes: EOMI  Respiratory: Unlabored breathing  Cardiovascular: Regular rate  Skin: No rashes or open wounds appreciated on back  Musculoskeletal: Strength stable  Neurologic: Sensation stable    Brief Hospital Course  Patient was admitted to surgical floor following the below procedure. Physical therapy was initiated and patient was restarted on regular diet and any indicated home medications. They received oral pain medicine that was titrated to patient's needs. Labs were routinely followed and electrolytes were replaced as indicated. Prior to discharge patient was cleared by physical therapy, had adequate pain control, and had return of bowel function. Appropriate follow-up was scheduled.     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 10/21/2024    Failure of spinal implant 10/21/2024    Visit for preventive health examination 05/19/2024    Left-sided chest pain 01/19/2024    Fall from height of greater than 3 feet 12/01/2023    Thoracic spine fracture (CMS-HCC) 12/01/2023    Coronary artery disease involving native coronary artery of native heart without angina pectoris 01/27/2023    S/P lumbar spinal arthrodesis 12/31/2022    Foraminal stenosis of lumbar region 07/30/2022    Degenerative disc disease, lumbar 07/30/2022  Spinal stenosis of lumbar region with neurogenic claudication 07/30/2022    Spondylolisthesis, lumbar region 07/30/2022    Pain in right shoulder 01/20/2022    Abnormal cardiac function test 01/13/2022    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 01/31/2020    Iron  deficiency anemia 01/03/2020    Chronic left-sided low back pain without sciatica 12/20/2019    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 09/23/2019    Anxiety and depression 09/05/2019    Contact w and exposure to oth viral communicable diseases 08/04/2019    Bilateral hearing loss 08/04/2019    GERD (gastroesophageal reflux disease) 08/04/2019    Obesity (BMI 30.0-34.9) 08/04/2019    Palpitations 08/04/2019    Insomnia 08/04/2019    Mixed hyperlipidemia 07/19/2019    Hypertension, essential 07/19/2019    Primary insomnia 07/19/2019    Dry eye syndrome of both eyes 10/14/2017    Astigmatism of right eye 03/24/2017    Cataract, right eye 03/24/2017    Astigmatism of left eye 03/10/2017    Cataract, left eye 03/09/2017       Items Needing Follow Up  Pending items or areas that need to be addressed at follow up: see below  Pending Labs       Order Current Status    CULTURE-AFB In process    CULTURE-ANAEROBIC In process    CULTURE-ANAEROBIC In process    CULTURE-ANAEROBIC In process    CULTURE-FUNGAL,OTHER Preliminary result    CULTURE-FUNGAL,OTHER Preliminary result    CULTURE-FUNGAL,OTHER Preliminary result    CULTURE-WOUND/TISSUE/FLUID(AEROBIC ONLY)W/SENSITIVITY Preliminary result    CULTURE-WOUND/TISSUE/FLUID(AEROBIC ONLY)W/SENSITIVITY Preliminary result    CULTURE-WOUND/TISSUE/FLUID(AEROBIC ONLY)W/SENSITIVITY Preliminary result            Surgical Procedures:   Procedure(s) (LRB):  REMOVAL THORACIC 9 TO LUMBAR 2 INSTRUMENTATION (N/A)  PROCEDURE, SPINE, STEREOTACTIC, COMPUTER-ASSISTED (N/A)    Significant Diagnostic Studies and Procedures: noted in brief hospital course    Consults:    None    Patient Disposition: Home       Patient instructions/medications:      Regular Diet    You have no dietary restriction. Please continue with a healthy balanced diet.    While taking pain medications:  - Drink plenty of fluids (not including alcohol)  - Add extra fiber to your diet  - Continue your stool softeners to help prevent constipation     Activity as Tolerated    POSTERIOR LUMBAR SURGERY: Walking daily is your physical therapy until follow up. Moderate walking is recommended. No impact exercises. Please follow the physical therapy guidelines with minimizing bending, lifting, twisting.    LATERAL LUMBAR SURGERY: If you had a lateral fusion performed (you have an incision on your side), use an assistive device (walker) at all times for the first 2 weeks to minimize inflammation. After 2 weeks, you may walk without the walker.    ANTERIOR CERVICAL SURGERY: No heavy lifting until follow up. You may walk as much as you wish and use your arms for daily tasks. Please no lifting over 5-10 lbs until follow up. If you have a collar on, please wear it until follow up.     Bathing Restrictions - Spine    LUMBAR SURGERY INSTRUCTIONS: If you had an anterior, lateral or posterior surgery please begin showering 5 days after surgery. Leave your dressings in place and shower normally allowing water  to run over the wounds. Do not scrub the wounds. Good hygiene is very important for preventing infections. Normal  showering keeps your surrounding skin clean and healthy. After the shower, remove the old dressings, pat the wound dry and reapply dry dressings.    ANTERIOR CERVICAL SURGERY INSTRUCTIONS: The anterior neck heals very quickly. Your wound was closed with either medical glue or steri strips. Please remove your dressings 2 days after surgery and leave the wound open to air. Please begin normal showering 2 days after surgery. It is ok to let soapy water  run over the wound.     Driving Restrictions - Spine    No driving while taking pain medications. Once you are no longer taking pain medications, you may resume driving if you feel safe to operate a vehicle during your recovery.     Lifting Restrictions - Spine    Do not lift more than 5-10 pounds until after follow-up appointment.     Incision Care - Spine    IF APPLYING DRESSINGS TO WOUNDS, USE DRY DRESSINGS ONLY. GAUZE AND TAPE ARE RECOMMENDED. PLEASE DO NOT USE IMPERVIOUS (PLASTIC) BANDAIDS OR DRESSINGS AS THEY TRAP MOISTURE AND SLOW WOUND HEALING.    MOST PATIENTS CHANGE DRESSINGS DAILY AFTER THEIR SHOWER. (SEE BELOW)    LUMBAR SURGERY INSTRUCTIONS: If you had an anterior, lateral or posterior surgery please begin showering 5 days after surgery. Leave your dressings in place and shower normally allowing water  to run over the wounds. Do not scrub the wounds. Good hygiene is very important for preventing infections. Normal showering keeps your surrounding skin clean and healthy. After the shower, remove the old dressings, pat the wound dry and reapply dry dressings.    ANTERIOR CERVICAL SURGERY INSTRUCTIONS: The anterior neck heals very quickly. Your wound was closed with either medical glue or steri strips. Please remove your dressings 2 days after surgery and leave the wound open to air. Please begin normal showering 2 days after surgery. It is ok to let soapy water  run over the wound.     Report These Signs and Symptoms - Spine    Please call for fevers >101F, chills, increasing pain, redness, drainage, puss formation, swelling, sudden sensory changes, loss of motor function, increasing weakness or pain in your legs, or severe headache that does not resolve with medication.    Please report to local emergency department for any sudden decline in overall health.     Questions About Your Stay    IF YOU HAVE ANY QUESTIONS OR CONCERNS, PLEASE CONTACT DR. FIDELA OFFICE, PLEASE CALL HIS NURSE JULIA [phone], OR SEND THEM A MESSAGE THROUGH MY CHART      IF YOU HAVE ANY QUESTIONS OR CONCERNS AFTER HOURS, PLEASE CALL [phone] AND ASK FOR THE ORTHOPEDIC RESIDENT ON CALL     Discharging attending physician: CHRISTEL FONDA DASEN [[id number]]       Current Discharge Medication List         START taking these medications    Details   docusate (COLACE) 100 mg capsule Take one capsule by mouth twice daily. Indications: constipation    PRESCRIPTION TYPE:  OTC      HYDROcodone /acetaminophen  (NORCO) 5/325 mg tablet Take one tablet by mouth every 4 hours as needed. Indications: pain  Qty: 25 tablet, Refills: 0    PRESCRIPTION TYPE:  Normal            CONTINUE these medications which have NOT CHANGED    Details   ascorbic acid  (VITAMIN C ) 500 mg tablet Take one tablet by mouth daily.    PRESCRIPTION TYPE:  Historical Med cetirizine  (ZYRTEC ) 10 mg tablet Take one tablet by mouth every morning.    PRESCRIPTION TYPE:  Historical Med      citalopram  (CELEXA ) 40 mg tablet Take one tablet by mouth daily.  Qty: 90 tablet, Refills: 3    PRESCRIPTION TYPE:  Normal      cyclobenzaprine  (FLEXERIL ) 5 mg tablet Take one tablet to two tablets by mouth daily as needed.    PRESCRIPTION TYPE:  Historical Med      ezetimibe  (ZETIA ) 10 mg tablet TAKE 1 TABLET BY MOUTH EVERY DAY IN THE MORNING  Qty: 90 tablet, Refills: 3    PRESCRIPTION TYPE:  Normal      fluticasone propionate (FLONASE) 50 mcg/actuation nasal spray, suspension Apply one spray to each nostril as directed at bedtime daily. Shake bottle gently before using.    PRESCRIPTION TYPE:  Historical Med      folic acid  (FOLVITE ) 1 mg tablet TAKE 1 TABLET BY MOUTH EVERY DAY  Qty: 90 tablet, Refills: 2    PRESCRIPTION TYPE:  Normal      gabapentin  (NEURONTIN ) 300 mg capsule Take one capsule by mouth every 8 hours.  Qty: 270 capsule, Refills: 3    PRESCRIPTION TYPE:  Normal  Comments: Please place refills on file.  Associated Diagnoses: Anxiety and depression; Hypertension, essential; Mixed hyperlipidemia      GLUCOSAMINE SULFATE PO Take 1,500 mg by mouth every morning.    PRESCRIPTION TYPE:  Historical Med      ketoconazole  (NIZORAL ) 2 % topical cream APPLY TO AFFECTED AREA TWICE DAILY FOR 14 DAYS  Qty: 30 g, Refills: 1    PRESCRIPTION TYPE:  Normal      ketoconazole  (NIZORAL ) 2 % topical shampoo Apply topically to affected area of damp skin, lather, leave on 5 minutes, and rinse. Repeat daily for 10 days  Indications: seborrheic dermatitis  Qty: 120 mL, Refills: 3    PRESCRIPTION TYPE:  Normal      losartan  (COZAAR ) 25 mg tablet TAKE 1 TABLET BY MOUTH EVERY DAY  Qty: 90 tablet, Refills: 3    PRESCRIPTION TYPE:  Normal      meloxicam  (MOBIC ) 15 mg tablet Take one tablet by mouth every morning.  Qty: 90 tablet, Refills: 3    PRESCRIPTION TYPE:  Normal  Associated Diagnoses: Anxiety and depression; Hypertension, essential; Mixed hyperlipidemia; Gastroesophageal reflux disease with esophagitis without hemorrhage; Primary insomnia; Visit for preventive health examination      omeprazole  DR (PRILOSEC) 40 mg capsule TAKE ONE CAPSULE BY MOUTH DAILY BEFORE BREAKFAST.  Qty: 90 capsule, Refills: 1    PRESCRIPTION TYPE:  Normal      rosuvastatin  (CRESTOR ) 20 mg tablet TAKE 1 TABLET BY MOUTH EVERY DAY  Qty: 90 tablet, Refills: 3    PRESCRIPTION TYPE:  Normal  Associated Diagnoses: Anxiety and depression; Hypertension, essential; Mixed hyperlipidemia; Gastroesophageal reflux disease with esophagitis without hemorrhage      zolpidem  (AMBIEN ) 5 mg tablet TAKE 1 TABLET BY MOUTH AT BEDTIME AS NEEDED FOR SLEEP  Qty: 30 tablet, Refills: 3    PRESCRIPTION TYPE:  Normal  Associated Diagnoses: Mixed hyperlipidemia; Hypertension, essential; Gastroesophageal reflux disease with esophagitis without hemorrhage; Anxiety and depression; Primary insomnia; Visit for preventive health examination              Future Appointments   Date Time Provider Department Center   01/11/2025  4:00 PM Oneita Dorn LABOR, MD [facility] CVM Exam   01/13/2025 10:15 AM Christel Fonda DASEN, MD [facility] SPINE  03/17/2025  1:45 PM Bunch, Fonda DASEN, MD [facility] SPINE         Signed:  Susanna JONETTA Birmingham, MD  12/14/2024      cc:  Primary Care Physician:  Al-HihiJamal HERO   Verified  Referring physicians:     Additional provider(s):        Code Status  Code Status (From admission, onward)     Start     Ordered    12/13/24 1315  Full Resuscitation Effort (Full Code)  ONGOING,   Status:    Canceled        References:    [place]  City - Resuscitation Status and Level of   Intervention Policy    [place]  City - Resuscitation Status and Level of   Intervention Guideline    [place]  City - Levels of Intervention Associated   with DNAR Resuscitation Status Surveyor, Quantity)    [employer] -   Resuscitation Status and Level of Intervention Policy    [employer] -   Resuscitation Status and Level of Intervention Guideline    [employer] -   Levels of Intervention Associated with DNAR Resuscitation Status Scientist, Research (life Sciences))    [place] - Resuscitation Status and Level of Intervention   Guideline    [place] - Resuscitation Status and Level of Intervention   Policy    [place] - Levels of Intervention Associated with DNAR   Resuscitation Status Tri-[place]old    [place] - Resuscitation Status and Level of   Intervnetion Guideline    [place] - Resuscitation Status and Level of   Intervention Policy    [place] - Levels of Intervention Associated with DNAR   Resuscitation Status Tri-[place]old    12/13/24 1313

## 2024-12-15 ENCOUNTER — Encounter: Admit: 2024-12-15 | Discharge: 2024-12-15 | Payer: BLUE CROSS/BLUE SHIELD

## 2024-12-18 LAB — CULTURE-WOUND/TISSUE/FLUID(AEROBIC ONLY)W/SENSITIVITY

## 2024-12-18 LAB — CULTURE-ANAEROBIC

## 2024-12-21 ENCOUNTER — Encounter: Admit: 2024-12-21 | Discharge: 2024-12-21 | Payer: BLUE CROSS/BLUE SHIELD

## 2024-12-27 ENCOUNTER — Encounter: Admit: 2024-12-27 | Discharge: 2024-12-27 | Payer: BLUE CROSS/BLUE SHIELD

## 2024-12-27 DIAGNOSIS — Z09 Encounter for follow-up examination after completed treatment for conditions other than malignant neoplasm: Principal | ICD-10-CM

## 2024-12-30 ENCOUNTER — Encounter: Admit: 2024-12-30 | Discharge: 2024-12-30 | Payer: BLUE CROSS/BLUE SHIELD

## 2024-12-30 DIAGNOSIS — F5101 Primary insomnia: Secondary | ICD-10-CM

## 2024-12-30 DIAGNOSIS — F419 Anxiety disorder, unspecified: Principal | ICD-10-CM

## 2024-12-30 DIAGNOSIS — Z0000 Encounter for general adult medical examination without abnormal findings: Secondary | ICD-10-CM

## 2024-12-30 DIAGNOSIS — E782 Mixed hyperlipidemia: Secondary | ICD-10-CM

## 2024-12-30 DIAGNOSIS — I10 Essential (primary) hypertension: Secondary | ICD-10-CM

## 2024-12-30 DIAGNOSIS — K21 Gastroesophageal reflux disease with esophagitis without hemorrhage: Secondary | ICD-10-CM

## 2024-12-30 MED ORDER — ROSUVASTATIN 20 MG PO TAB
20 mg | ORAL_TABLET | Freq: Every day | ORAL | 3 refills | 90.00000 days | Status: AC
Start: 2024-12-30 — End: ?

## 2024-12-30 MED ORDER — OMEPRAZOLE 40 MG PO CPDR
40 mg | ORAL_CAPSULE | Freq: Every day | ORAL | 3 refills | 90.00000 days | Status: AC
Start: 2024-12-30 — End: ?

## 2024-12-30 MED ORDER — MELOXICAM 15 MG PO TAB
15 mg | ORAL_TABLET | Freq: Every morning | ORAL | 3 refills | 30.00000 days | Status: AC
Start: 2024-12-30 — End: ?

## 2024-12-30 NOTE — Telephone Encounter [36]
 1. Received e-request from pharmacy requesting new Rx for   Requested Prescriptions     Pending Prescriptions Disp Refills    rosuvastatin  (CRESTOR ) 20 mg tablet [Pharmacy Med Name: ROSUVASTATIN  CALCIUM 20 MG TAB] 90 tablet 3     Sig: TAKE 1 TABLET BY MOUTH EVERY DAY    meloxicam  (MOBIC ) 15 mg tablet [Pharmacy Med Name: MELOXICAM  15 MG TABLET] 90 tablet 3     Sig: TAKE 1 TABLET BY MOUTH EVERY DAY IN THE MORNING    omeprazole  DR (PRILOSEC) 40 mg capsule [Pharmacy Med Name: OMEPRAZOLE  DR 40 MG CAPSULE] 90 capsule 1     Sig: TAKE 1 CAPSULE BY MOUTH DAILY BEFORE BREAKFAST.     2. Last Rx written: rosuvastatin  - 11/16/23   (#90 x3) by PCP    Last Rx written: omeprazole - 07/04/24  (#90 x1) by PCP  Drug-Drug: citalopram  and omeprazole  DR     Last Rx written: meloxican- 01/19/24  (#90 x3) by PCP   This refill cannot be delegated    HGB in normal range and within 360 days       3. Last Gen Med Visit: in person  on 10/21/24    4. Next Appt due: Provider recommended return date from last office visit: 4 months    Future Appointments   Date Time Provider Department Center   01/11/2025  4:00 PM Oneita Dorn LABOR, MD [facility] CVM Exam   01/13/2025 10:15 AM Christel Fonda DASEN, MD [facility] SPINE   03/17/2025  1:45 PM Christel Fonda DASEN, MD [facility] SPINE       5. All protocol criteria met? No, see below     rosuvastatin  meets refill protocol and sent to pt preferred pharmacy. See above in Red.     Jared Bedoya Jimerson, LPN

## 2025-01-02 ENCOUNTER — Encounter: Admit: 2025-01-02 | Discharge: 2025-01-02 | Payer: BLUE CROSS/BLUE SHIELD

## 2025-01-03 ENCOUNTER — Encounter: Admit: 2025-01-03 | Discharge: 2025-01-03 | Payer: BLUE CROSS/BLUE SHIELD

## 2025-01-11 ENCOUNTER — Encounter: Admit: 2025-01-11 | Discharge: 2025-01-11 | Payer: BLUE CROSS/BLUE SHIELD

## 2025-01-11 ENCOUNTER — Ambulatory Visit: Admit: 2025-01-11 | Discharge: 2025-01-12 | Payer: BLUE CROSS/BLUE SHIELD

## 2025-01-11 VITALS — BP 133/87 | HR 83 | Ht 68.5 in | Wt 190.0 lb

## 2025-01-11 DIAGNOSIS — Z981 Arthrodesis status: Principal | ICD-10-CM

## 2025-01-11 DIAGNOSIS — I10 Essential (primary) hypertension: Secondary | ICD-10-CM

## 2025-01-11 DIAGNOSIS — E782 Mixed hyperlipidemia: Secondary | ICD-10-CM

## 2025-01-11 MED ORDER — LOSARTAN 50 MG PO TAB
50 mg | ORAL_TABLET | Freq: Every day | ORAL | 1 refills | 90.00000 days | Status: AC
Start: 2025-01-11 — End: ?

## 2025-01-11 NOTE — Patient Instructions [37]
 I have adjusted your losartan  25--->50mg  daily to better manage blood pressure    Please let us  know if BP not consistently < 130 with the adjustment    I'll plan for a follow up clinic visit in 1 year, or sooner if you're having questions or problems.     Please call in June to schedule January visit    We will plan for a stress test after next visit    I work with a wonderful group of nurses ([place], [place], [place], [place] and [place]) that are part of the [employer].   We are available through the MyChart system, our best form of communication with you. In addition there is a shared phone number for the [facility], [phone].  Please contact this team should you have any questions about your visit today, concerns about upcoming appointments/procedures or should you have concerning cardiac symptoms.     Please call [phone] if needing to schedule an office visit.

## 2025-01-11 NOTE — Progress Notes [1]
 Date of Service: 01/11/2025    Jared Hebert is a 74 y.o. male.       HPI     I had the opportunity to see Jared Hebert back today for a follow-up visit.  Jared Hebert is a delightful 74 year old individual that I have been following for coronary disease, hypertension, and hyperlipidemia.  Jared Hebert has struggled tremendously with repeated back surgeries over the past 2+ years, initially 12/23, he required a reoperation after a fall from a large elevation with thoracic spine fracture in the fall 2024.  Finally he was taken back to the operating room just a month ago for removal of all hardware with Dr. Christel.  He indicates this most recent surgery was his easiest it took just a couple of hours, he was discharged on postoperative day 2.  1 screw tip had broken off, and remains in the lumbar vertebrae, otherwise the surgery went well.  He indicates his pain level has diminished entirely, currently just taking as needed Tylenol .    Jared Hebert has coronary artery calcification noted on a previous scan in 2020, total score 315.  He had a heart catheterization in 2023 showing moderate nonobstructive disease, mid LAD around 50 to 60% stenosis, 40% diagonal stenosis, 60% ramus intermediate and 50 to 60% mid RCA.  Today he is reporting no concerning chest pain symptoms.  His risk factors are under good control.  He and his wife look forward to a trip traveling to the [facility] in June with family members.       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 No significant valvular abnormalities.  3/13/7- [facility]): LVEF 66%. Exercise gated SPECT myocardial perfusion study appears within normal limits. Excellent functional capacity, attaining a workload of 10.2 METs.   09/21/19- Coronary Calcium Score: Left main: 0 LAD:  114 LCX: 144 RCA: 5 7 Total calcium score: 315.  11/10/19- Echo: LVEF 60%. Mild left ventricular diastolic dysfunction. . No significant valvular abnormalities. No pericardial effusion.  11/10/19- [facility]  [facility]: LVEF 60%. This study is normal and represents low probability for significant inducible myocardial ischemia. There are no perfusion abnormalities. All myocardial segments appear viable. Regional and global left ventricular function are normal.  High risk scintigraphic features are absent.   10/25/21- Echo: LVEF 60-65%.  LV wall thickness. No regional wall motion abnormality. Mild MR. CVP 3 mmHg.  Peak PA systolic pressure 24 mmHg.  10/25/21- [facility]  [facility]: LVEF 60%. This myocardial pressure imaging study did not demonstrate any ischemia on the tomographic pattern, it was no evidence of fixed or reversible perfusion abnormalities. Transient ischemic dilatation was present. The pharmacologic ECG portion of the study is negative for ischemia.  12/04/21- CCTA: Moderate to severe proximal three-vessel coronary disease in the circumflex LAD and diagonal distributions, as well as the obtuse marginal branch of the nondominant circumflex. Consider computational FFR for verification and identification of the vessels with the most significant potential for significant hemodynamic obstruction  01/13/22- Left Heart Catheterization: Moderate nonobstructive coronary artery disease involving the mid left anterior descending, ramus intermedius, and the mid right coronary artery. LAD,  proximal long segment of 30% to 40% stenosis, mid LAD has a focal area of 50% to 60% stenosis.  This is a type 3 LAD.  The LAD gives rise to a diagonal branch, which has a 40% to 50% stenosis in the mid segment. Ramus Intermedius, proximal to mid segment of the ramus intermedius has a long segment of 60% to 70% stenosis. LCX 30% stenosis at the ostium. RCA, long segment of 50% to 60% stenosis in its mid segment.               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  He was started on Ambien  5 mg about 2020.  He currently takes it about half the nights of the week and does find it helpful.  He denies any side effects.      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   HENT: Negative.     Eyes: Negative.    Cardiovascular: Negative.    Respiratory: Negative.     Endocrine: Negative.    Skin: Negative.    Musculoskeletal: Negative.    Gastrointestinal: Negative.    Genitourinary: Negative.    Neurological: Negative.    Psychiatric/Behavioral: Negative.     Allergic/Immunologic: Negative.        Physical Exam      General Appearance: no acute distress, full alert and oriented  Skin: warm, moist, no ulcers  HEENT: unremarkable, moist mucous  membranes  Neck Veins:  neck veins are flat, neck veins are not distended  Carotid Arteries: normal carotid upstroke bilaterally, no bruits  Chest Inspection: chest is normal in appearance  Auscultation/Percussion: lungs clear to auscultation, no rales, rhonchi, or wheezing  Cardiac Rhythm: regular rhythm and normal rate  Cardiac Auscultation: Normal S1 & S2, no S3 or S4, no rub  Murmurs: no cardiac murmurs   Extremities: no lower extremity edema; 2+ symmetric distal pulses  Abdomen: aorta nonpalpable, no bruit  Neurologic Exam: neurological assessment grossly intact    Cardiovascular Studies  No ECG today  Cardiovascular Health Factors  Vitals BP Readings from Last 3 Encounters:   01/11/25 133/87   12/14/24 125/67   11/29/24 (!) 146/80     Wt Readings from Last 3 Encounters:   01/11/25 86.2 kg (190 lb)   12/13/24 86.6 kg (190 lb 14.7 oz)   11/29/24 87 kg (191 lb 12.8 oz)     BMI Readings from Last 3 Encounters:   01/11/25 28.47 kg/m?   12/13/24 29.04 kg/m?   11/29/24 29.16 kg/m?      Smoking Tobacco Use History[1]   Lipid Profile Cholesterol   Date Value Ref Range Status   11/29/2024 118 <200 mg/dL Final     HDL   Date Value Ref Range Status   11/29/2024 51 >40 mg/dL Final     LDL   Date Value Ref Range Status   11/29/2024 59.67 <100.00 mg/dL Final     Triglycerides   Date Value Ref Range Status   11/29/2024 85 <150 mg/dL Final      Blood Sugar Hemoglobin A1C   Date Value Ref Range Status   11/29/2024 5.9 (H) 4.0 - 5.7 % Final     Comment:     The ADA recommends that most patients with type 1 and type 2 diabetes maintain an A1c level <7%.     Glucose   Date Value Ref Range Status   11/29/2024 104 (H) 70 - 100 mg/dL Final   94/71/7974 885 (H) 70 - 100 mg/dL Final   87/89/7975 99 70 - 100 mg/dL Final         Problems Addressed Today  Encounter Diagnoses   Name Primary?    S/P lumbar spinal arthrodesis Yes    Mixed hyperlipidemia     Hypertension, essential        Assessment and Plan     1.  Coronary artery disease-Jared Hebert is 3 years out from a heart catheterization which showed moderate three-vessel disease.  We will plan for a stress test after my next visit with him in 1 year.  He is reporting no concerning symptoms, risk factors are under good control as outlined below.    2.  Primary hypertension-blood pressure is mildly elevated today, he has had similar readings at home, actually checking in the setting of headaches at home over recent weeks.  His blood pressure is consistently above 140 with a checks.  With this in mind I have adjusted his losartan  from 25 to 50 mg.  He will continue to monitor blood pressure let us  know if he is not averaging less than 130.  I anticipate he will be more active in the upcoming months with his recent back surgery and marked improvement in his pain.  This 2 will of course be beneficial for his blood pressure management.    3.  Hyperlipidemia-lipids were checked in December, at goal at that time, LDL 59 HDL 51 and triglycerides  85.  He is compliant with his rosuvastatin , he will continue 20 mg daily in addition to Zetia  10 mg daily.    4.  Back pain-Jared Hebert had removal of all hardware from his previous lumbar fusion with extension to his thoracic spine after T10 fracture.  He has a small remnant of a screw remaining in one of the lumbar vertebrae otherwise all hardware out.  He is pain-free, overall doing well at this time, he is very happy with the efforts from Dr. Christel.    Jared Hebert will see me back in 1 year         Current Medications (including today's revisions)   ascorbic acid  (VITAMIN C ) 500 mg tablet Take one tablet by mouth daily.    cetirizine  (ZYRTEC ) 10 mg tablet Take one tablet by mouth every morning.    citalopram  (CELEXA ) 40 mg tablet Take one tablet by mouth daily.    cyclobenzaprine  (FLEXERIL ) 5 mg tablet Take one tablet to two tablets by mouth daily as needed.    ezetimibe  (ZETIA ) 10 mg tablet TAKE 1 TABLET BY MOUTH EVERY DAY IN THE MORNING    fluticasone propionate (FLONASE) 50 mcg/actuation nasal spray, suspension Apply one spray to each nostril as directed at bedtime daily. Shake bottle gently before using.    folic acid  (FOLVITE ) 1 mg tablet TAKE 1 TABLET BY MOUTH EVERY DAY    gabapentin  (NEURONTIN ) 300 mg capsule Take one capsule by mouth every 8 hours.    GLUCOSAMINE SULFATE PO Take 1,500 mg by mouth every morning.    ketoconazole  (NIZORAL ) 2 % topical cream APPLY TO AFFECTED AREA TWICE DAILY FOR 14 DAYS (Patient taking differently: Apply  topically to affected area twice daily as needed.)    ketoconazole  (NIZORAL ) 2 % topical shampoo Apply topically to affected area of damp skin, lather, leave on 5 minutes, and rinse. Repeat daily for 10 days  Indications: seborrheic dermatitis (Patient taking differently: Apply  topically to affected area at bedtime daily. Apply topically to affected area of damp skin, lather, leave on 5 minutes, and rinse. Repeat daily for 10 days  Indications: seborrheic dermatitis)    losartan  (COZAAR ) 50 mg tablet Take one tablet by mouth daily.    meloxicam  (MOBIC ) 15 mg tablet TAKE 1 TABLET BY MOUTH EVERY DAY IN THE MORNING    pantoprazole  DR (PROTONIX ) 40 mg tablet Take one tablet by mouth every morning.    rosuvastatin  (CRESTOR ) 20 mg tablet TAKE 1 TABLET BY MOUTH EVERY DAY    zolpidem  (AMBIEN ) 5 mg tablet TAKE 1 TABLET BY MOUTH AT BEDTIME AS NEEDED FOR SLEEP            Total time spent on today's office visit was 30 minutes. This includes face-to-face in person visit with patient as well as nonface-to-face time including review of the EMR, outside records, labs, radiologic studies, echocardiogram & other cardiovascular studies, formulation of treatment plan, after visit summary, future disposition, and lastly on documentation.    This note was dictated using the Dragon speech recognition software.  Transcription errors may occur with the use of this software. Editing and proofreading were done by the author of this document.  In spite of the author's best effort to identify every error introduced by voice to text dictation, errors may still be present.            [1]   Social History  Tobacco Use   Smoking Status Former    Current packs/day: 0.00    Average  packs/day: 1 pack/day for 39.4 years (39.4 ttl pk-yrs)    Types: Cigarettes, Pipe, Cigars    Start date: 08/04/1967    Quit date: 12/22/2006    Years since quitting: 18.0    Passive exposure: Past   Smokeless Tobacco Former    Types: Chew    Quit date: 12/22/2006   Tobacco Comments    15 + yrs since last smoked

## 2025-01-12 ENCOUNTER — Encounter: Admit: 2025-01-12 | Discharge: 2025-01-12 | Payer: BLUE CROSS/BLUE SHIELD

## 2025-01-16 ENCOUNTER — Encounter: Admit: 2025-01-16 | Discharge: 2025-01-16 | Payer: BLUE CROSS/BLUE SHIELD
